# Patient Record
Sex: Female | Born: 1935 | Race: White | Hispanic: No | Smoking: Former smoker
Health system: Southern US, Community
[De-identification: ages and names within clinical notes are randomized; demographics above are authoritative.]

## PROBLEM LIST (undated history)

## (undated) DIAGNOSIS — K5900 Constipation, unspecified: Secondary | ICD-10-CM

## (undated) DIAGNOSIS — F329 Major depressive disorder, single episode, unspecified: Secondary | ICD-10-CM

## (undated) DIAGNOSIS — R51 Headache: Secondary | ICD-10-CM

## (undated) DIAGNOSIS — M1990 Unspecified osteoarthritis, unspecified site: Secondary | ICD-10-CM

## (undated) DIAGNOSIS — E785 Hyperlipidemia, unspecified: Secondary | ICD-10-CM

## (undated) DIAGNOSIS — M1711 Unilateral primary osteoarthritis, right knee: Secondary | ICD-10-CM

## (undated) DIAGNOSIS — R600 Localized edema: Secondary | ICD-10-CM

## (undated) DIAGNOSIS — N3281 Overactive bladder: Secondary | ICD-10-CM

## (undated) DIAGNOSIS — F32A Depression, unspecified: Secondary | ICD-10-CM

## (undated) DIAGNOSIS — N189 Chronic kidney disease, unspecified: Secondary | ICD-10-CM

## (undated) DIAGNOSIS — R2681 Unsteadiness on feet: Secondary | ICD-10-CM

## (undated) DIAGNOSIS — I872 Venous insufficiency (chronic) (peripheral): Secondary | ICD-10-CM

## (undated) DIAGNOSIS — K5901 Slow transit constipation: Secondary | ICD-10-CM

## (undated) DIAGNOSIS — M169 Osteoarthritis of hip, unspecified: Secondary | ICD-10-CM

## (undated) DIAGNOSIS — I10 Essential (primary) hypertension: Secondary | ICD-10-CM

## (undated) HISTORY — PX: OTHER SURGICAL HISTORY: SHX169

## (undated) HISTORY — DX: Overactive bladder: N32.81

## (undated) HISTORY — PX: BREAST SURGERY: SHX581

## (undated) HISTORY — DX: Constipation, unspecified: K59.00

## (undated) HISTORY — DX: Chronic kidney disease, unspecified: N18.9

## (undated) HISTORY — PX: BREAST EXCISIONAL BIOPSY: SUR124

## (undated) HISTORY — PX: GLAUCOMA VALVE INSERTION: SHX5297

## (undated) HISTORY — DX: Unsteadiness on feet: R26.81

## (undated) HISTORY — DX: Hyperlipidemia, unspecified: E78.5

## (undated) HISTORY — DX: Localized edema: R60.0

## (undated) HISTORY — DX: Unilateral primary osteoarthritis, right knee: M17.11

## (undated) HISTORY — DX: Slow transit constipation: K59.01

## (undated) HISTORY — DX: Venous insufficiency (chronic) (peripheral): I87.2

## (undated) HISTORY — DX: Osteoarthritis of hip, unspecified: M16.9

---

## 1958-01-29 HISTORY — PX: BREAST BIOPSY: SHX20

## 1997-05-11 ENCOUNTER — Ambulatory Visit (HOSPITAL_COMMUNITY): Admission: RE | Admit: 1997-05-11 | Discharge: 1997-05-11 | Payer: Self-pay | Admitting: Internal Medicine

## 1997-09-06 ENCOUNTER — Ambulatory Visit (HOSPITAL_COMMUNITY): Admission: RE | Admit: 1997-09-06 | Discharge: 1997-09-06 | Payer: Self-pay | Admitting: Gynecology

## 1998-01-29 HISTORY — PX: REDUCTION MAMMAPLASTY: SUR839

## 1998-05-31 ENCOUNTER — Ambulatory Visit (HOSPITAL_COMMUNITY): Admission: RE | Admit: 1998-05-31 | Discharge: 1998-05-31 | Payer: Self-pay | Admitting: Internal Medicine

## 1998-05-31 ENCOUNTER — Encounter: Payer: Self-pay | Admitting: Internal Medicine

## 1998-11-08 ENCOUNTER — Encounter: Payer: Self-pay | Admitting: Internal Medicine

## 1998-11-08 ENCOUNTER — Ambulatory Visit (HOSPITAL_COMMUNITY): Admission: RE | Admit: 1998-11-08 | Discharge: 1998-11-08 | Payer: Self-pay | Admitting: Internal Medicine

## 1999-02-16 ENCOUNTER — Encounter: Payer: Self-pay | Admitting: Internal Medicine

## 1999-02-16 ENCOUNTER — Encounter: Admission: RE | Admit: 1999-02-16 | Discharge: 1999-02-16 | Payer: Self-pay | Admitting: Internal Medicine

## 1999-05-01 ENCOUNTER — Encounter: Admission: RE | Admit: 1999-05-01 | Discharge: 1999-05-01 | Payer: Self-pay | Admitting: Internal Medicine

## 1999-05-01 ENCOUNTER — Encounter: Payer: Self-pay | Admitting: Internal Medicine

## 1999-05-04 ENCOUNTER — Other Ambulatory Visit: Admission: RE | Admit: 1999-05-04 | Discharge: 1999-05-04 | Payer: Self-pay | Admitting: Gynecology

## 2000-05-10 ENCOUNTER — Other Ambulatory Visit: Admission: RE | Admit: 2000-05-10 | Discharge: 2000-05-10 | Payer: Self-pay | Admitting: Gynecology

## 2000-05-13 ENCOUNTER — Encounter: Payer: Self-pay | Admitting: Gynecology

## 2000-05-13 ENCOUNTER — Encounter: Admission: RE | Admit: 2000-05-13 | Discharge: 2000-05-13 | Payer: Self-pay | Admitting: Gynecology

## 2000-05-17 ENCOUNTER — Encounter (INDEPENDENT_AMBULATORY_CARE_PROVIDER_SITE_OTHER): Payer: Self-pay

## 2000-05-17 ENCOUNTER — Other Ambulatory Visit: Admission: RE | Admit: 2000-05-17 | Discharge: 2000-05-17 | Payer: Self-pay | Admitting: Gynecology

## 2001-01-29 HISTORY — PX: ABDOMINAL HYSTERECTOMY: SHX81

## 2001-05-21 ENCOUNTER — Encounter: Payer: Self-pay | Admitting: Gynecology

## 2001-05-21 ENCOUNTER — Encounter: Admission: RE | Admit: 2001-05-21 | Discharge: 2001-05-21 | Payer: Self-pay | Admitting: Gynecology

## 2001-06-16 ENCOUNTER — Other Ambulatory Visit: Admission: RE | Admit: 2001-06-16 | Discharge: 2001-06-16 | Payer: Self-pay | Admitting: Gynecology

## 2002-06-04 ENCOUNTER — Other Ambulatory Visit: Admission: RE | Admit: 2002-06-04 | Discharge: 2002-06-04 | Payer: Self-pay | Admitting: Gynecology

## 2002-06-05 ENCOUNTER — Encounter: Admission: RE | Admit: 2002-06-05 | Discharge: 2002-06-05 | Payer: Self-pay | Admitting: Internal Medicine

## 2002-06-05 ENCOUNTER — Encounter: Payer: Self-pay | Admitting: Internal Medicine

## 2002-06-18 ENCOUNTER — Encounter: Payer: Self-pay | Admitting: Gynecology

## 2002-06-18 ENCOUNTER — Encounter: Admission: RE | Admit: 2002-06-18 | Discharge: 2002-06-18 | Payer: Self-pay | Admitting: Gynecology

## 2002-07-02 ENCOUNTER — Encounter: Admission: RE | Admit: 2002-07-02 | Discharge: 2002-07-02 | Payer: Self-pay | Admitting: Internal Medicine

## 2002-07-02 ENCOUNTER — Encounter: Payer: Self-pay | Admitting: Internal Medicine

## 2002-12-11 ENCOUNTER — Ambulatory Visit (HOSPITAL_COMMUNITY): Admission: RE | Admit: 2002-12-11 | Discharge: 2002-12-11 | Payer: Self-pay | Admitting: Gastroenterology

## 2003-08-05 ENCOUNTER — Encounter: Admission: RE | Admit: 2003-08-05 | Discharge: 2003-08-05 | Payer: Self-pay | Admitting: Internal Medicine

## 2003-08-09 ENCOUNTER — Other Ambulatory Visit: Admission: RE | Admit: 2003-08-09 | Discharge: 2003-08-09 | Payer: Self-pay | Admitting: Gynecology

## 2003-12-20 ENCOUNTER — Encounter: Admission: RE | Admit: 2003-12-20 | Discharge: 2003-12-20 | Payer: Self-pay | Admitting: Neurology

## 2004-06-05 ENCOUNTER — Encounter: Admission: RE | Admit: 2004-06-05 | Discharge: 2004-06-05 | Payer: Self-pay | Admitting: Internal Medicine

## 2004-09-05 ENCOUNTER — Ambulatory Visit (HOSPITAL_COMMUNITY): Admission: RE | Admit: 2004-09-05 | Discharge: 2004-09-05 | Payer: Self-pay | Admitting: Internal Medicine

## 2005-02-12 ENCOUNTER — Encounter: Admission: RE | Admit: 2005-02-12 | Discharge: 2005-02-12 | Payer: Self-pay | Admitting: Internal Medicine

## 2005-06-26 ENCOUNTER — Inpatient Hospital Stay (HOSPITAL_COMMUNITY): Admission: RE | Admit: 2005-06-26 | Discharge: 2005-06-28 | Payer: Self-pay | Admitting: Obstetrics and Gynecology

## 2005-06-26 ENCOUNTER — Encounter (INDEPENDENT_AMBULATORY_CARE_PROVIDER_SITE_OTHER): Payer: Self-pay | Admitting: *Deleted

## 2005-07-13 ENCOUNTER — Encounter: Admission: RE | Admit: 2005-07-13 | Discharge: 2005-07-13 | Payer: Self-pay | Admitting: Internal Medicine

## 2005-12-26 ENCOUNTER — Ambulatory Visit (HOSPITAL_COMMUNITY): Admission: RE | Admit: 2005-12-26 | Discharge: 2005-12-26 | Payer: Self-pay | Admitting: Obstetrics and Gynecology

## 2006-03-21 ENCOUNTER — Encounter: Admission: RE | Admit: 2006-03-21 | Discharge: 2006-03-21 | Payer: Self-pay | Admitting: Internal Medicine

## 2006-09-12 ENCOUNTER — Ambulatory Visit (HOSPITAL_COMMUNITY): Admission: RE | Admit: 2006-09-12 | Discharge: 2006-09-12 | Payer: Self-pay | Admitting: Internal Medicine

## 2006-09-12 ENCOUNTER — Ambulatory Visit: Payer: Self-pay | Admitting: Vascular Surgery

## 2006-09-12 ENCOUNTER — Encounter (INDEPENDENT_AMBULATORY_CARE_PROVIDER_SITE_OTHER): Payer: Self-pay | Admitting: Internal Medicine

## 2006-12-09 ENCOUNTER — Encounter: Admission: RE | Admit: 2006-12-09 | Discharge: 2006-12-09 | Payer: Self-pay | Admitting: Internal Medicine

## 2007-08-12 LAB — HM COLONOSCOPY

## 2007-11-08 ENCOUNTER — Ambulatory Visit (HOSPITAL_COMMUNITY): Admission: RE | Admit: 2007-11-08 | Discharge: 2007-11-08 | Payer: Self-pay | Admitting: Emergency Medicine

## 2007-12-16 ENCOUNTER — Encounter: Admission: RE | Admit: 2007-12-16 | Discharge: 2007-12-16 | Payer: Self-pay | Admitting: Internal Medicine

## 2008-01-14 ENCOUNTER — Encounter: Admission: RE | Admit: 2008-01-14 | Discharge: 2008-01-14 | Payer: Self-pay | Admitting: Gastroenterology

## 2009-03-08 ENCOUNTER — Encounter: Admission: RE | Admit: 2009-03-08 | Discharge: 2009-03-08 | Payer: Self-pay | Admitting: Neurosurgery

## 2009-03-21 ENCOUNTER — Encounter: Admission: RE | Admit: 2009-03-21 | Discharge: 2009-03-21 | Payer: Self-pay | Admitting: Internal Medicine

## 2009-06-02 ENCOUNTER — Encounter: Admission: RE | Admit: 2009-06-02 | Discharge: 2009-06-02 | Payer: Self-pay | Admitting: Obstetrics and Gynecology

## 2009-09-15 ENCOUNTER — Encounter: Admission: RE | Admit: 2009-09-15 | Discharge: 2009-09-15 | Payer: Self-pay | Admitting: Neurosurgery

## 2010-02-18 ENCOUNTER — Encounter: Payer: Self-pay | Admitting: Neurological Surgery

## 2010-02-18 ENCOUNTER — Encounter: Payer: Self-pay | Admitting: Obstetrics and Gynecology

## 2010-02-19 ENCOUNTER — Encounter: Payer: Self-pay | Admitting: Internal Medicine

## 2010-02-20 ENCOUNTER — Encounter: Payer: Self-pay | Admitting: Internal Medicine

## 2010-04-08 ENCOUNTER — Inpatient Hospital Stay (INDEPENDENT_AMBULATORY_CARE_PROVIDER_SITE_OTHER)
Admission: RE | Admit: 2010-04-08 | Discharge: 2010-04-08 | Disposition: A | Discharge status: Home / Self Care | Payer: Medicare Other | Source: Ambulatory Visit | Attending: Emergency Medicine | Admitting: Emergency Medicine

## 2010-04-08 ENCOUNTER — Ambulatory Visit (INDEPENDENT_AMBULATORY_CARE_PROVIDER_SITE_OTHER): Payer: Medicare Other

## 2010-04-08 DIAGNOSIS — R0989 Other specified symptoms and signs involving the circulatory and respiratory systems: Secondary | ICD-10-CM

## 2010-04-08 DIAGNOSIS — I498 Other specified cardiac arrhythmias: Secondary | ICD-10-CM

## 2010-04-08 DIAGNOSIS — R0609 Other forms of dyspnea: Secondary | ICD-10-CM

## 2010-06-13 ENCOUNTER — Ambulatory Visit (INDEPENDENT_AMBULATORY_CARE_PROVIDER_SITE_OTHER): Payer: Medicare Other

## 2010-06-13 ENCOUNTER — Inpatient Hospital Stay (INDEPENDENT_AMBULATORY_CARE_PROVIDER_SITE_OTHER)
Admission: RE | Admit: 2010-06-13 | Discharge: 2010-06-13 | Disposition: A | Discharge status: Home / Self Care | Payer: Medicare Other | Source: Ambulatory Visit | Attending: Family Medicine | Admitting: Family Medicine

## 2010-06-13 DIAGNOSIS — S92309A Fracture of unspecified metatarsal bone(s), unspecified foot, initial encounter for closed fracture: Secondary | ICD-10-CM

## 2010-06-16 NOTE — Op Note (Signed)
[name], OSE NO.:  [id number]   MEDICAL RECORD NO.:  [id number]          PATIENT TYPE:  INP   LOCATION:  9319                          FACILITY:  [facility]   PHYSICIAN:  Randye Lobo, M.D.   DATE OF BIRTH:  Jan 22, 1936   DATE OF PROCEDURE:  06/26/2005  DATE OF DISCHARGE:                                 OPERATIVE REPORT   PREOPERATIVE DIAGNOSIS:  1.  Incomplete uterovaginal prolapse.  2.  Genuine stress incontinence.  3.  Right vulvar sebaceous cyst.  4.  Right thigh nevus.   POSTOPERATIVE DIAGNOSIS:  1.  Incomplete uterovaginal prolapse.  2.  Genuine stress incontinence.  3.  Right vulvar sebaceous cyst.  4.  Right thigh nevus.   PROCEDURE:  Laparoscopically assisted vaginal hysterectomy with bilateral  salpingo-oophorectomy, McCall culdoplasty, anterior and posterior  colporrhaphy, tension-free vaginal tape suburethral sling, cystoscopy,  excision of right vulvar sebaceous cyst, excision of right thigh nevus.   SURGEON:  Conley Simmonds, MD   ASSISTANT:  Lodema Hong, MD   ANESTHESIA:  General endotracheal, local with 0.5% lidocaine with 1:200,000  of epinephrine vaginally and 0.5% Marcaine for local anesthesia of the  abdominal incisions.   IV FLUIDS:  2000 mL Ringer's lactate.   ESTIMATED BLOOD LOSS:  150 mL.   URINE OUTPUT:  350 mL.   COMPLICATIONS:  None.   INDICATIONS FOR PROCEDURE:  The patient is a 75 year old gravida 1, para 1  Caucasian female who has a 2 to 2-1/2 year history of a dropped bladder and  urinary incontinence.  The patient reported leakage of urine with coughing,  laughing and sneezing which was a social problem for her.  On pelvic  examination, the patient was noted to have a cystocele and rectocele and  uterine prolapse by her primary gynecologist, Revonda Standard, who sent her  to me for further evaluation and treatment.  The patient wished for surgical  cure and she had multichannel urodynamic testing performed which  documented  the presence of genuine stress incontinence with a leak point pressure of 79  cm of water.   The patient's family history was significant for uterine cancer in the  patient's mother.  On pelvic exam, the patient was noted to have on the  right medial thigh a 3 mm black flat nevus and a 0.75 cm sebaceous cyst of  the right labia majora.  The urethra was normal.  The patient had a second  degree cystocele, first-degree uterine prolapse, and a second-degree high  rectocele.  The cervix was noted to be normal in its appearance and the  uterus was small and there were no adnexal masses.   A plan was made to proceed with a laparoscopically assisted vaginal  hysterectomy with bilateral salpingo-oophorectomy, anterior and posterior  colporrhaphy, tension-free vaginal tape and cystoscopy and removal of the  right thigh nevus and vulvar sebaceous cyst after risks, benefits and  alternatives were discussed.   FINDINGS:  Examination under anesthesia revealed a second-degree cystocele,  first-degree uterine prolapse, and a small high rectocele versus a small  enterocele.  The uterus  had a normal appearance and the ovaries were  unremarkable.  The fallopian tubes were consistent with prior tubal surgery.   Cystoscopy with placement of a TVT sling documented the absence of a foreign  body in the bladder or the urethra.  The ureters were noted to be patent  bilaterally and the bladder was normal throughout 360 degrees including the  bladder dome and trigone.   SPECIMENS:  The uterus, tubes and ovaries were sent to pathology separately  from the right thigh nevus.   PROCEDURE:  The patient was reidentified in the preop hold area.  She did  receive Ancef 1 gram IV for antibiotic prophylaxis.  The patient received  both TED hose and PAS stockings for DVT prophylaxis.  The patient was  brought to the operating room where general endotracheal anesthesia was  induced.  The patient was placed  in the dorsal lithotomy position and the  abdomen and vagina were then sterilely prepped and draped.  A Foley catheter  was placed inside the bladder.  The speculum was placed inside the vagina  and a single-tooth tenaculum was placed on the anterior cervical lip.  This  was replaced with a Hulka tenaculum after the uterus was sounded to document  the proper endometrial canal.   The laparoscopy portion of the procedure began by creating a 1 cm umbilical  incision with the scalpel.  The dissection was carried down to the fascia  with an Allis clamp.  A 10 mm trocar was inserted directly into the  peritoneal cavity and the laparoscope confirmed proper placement.  A  pneumoperitoneum was achieved and the patient was placed in Trendelenburg  position.  A 5 mm incision was created in each the right and left lower  quadrants and 5 mm trocars were then placed into the peritoneal cavity under  visualization with the laparoscope.   An inspection of the pelvic and abdominal organs was performed and the  findings were as noted above.  The procedure began on the patient's right-  hand side.  The right ureter was identified.  The right infundibulopelvic  ligament was then grasped and it was cauterized with the tripolar instrument  before it was divided using the same instrument.  The dissection was carried  down through the broad ligament, again using the tripolar instrument.  The  tripolar was similarly used to grasp the round ligament on the right-hand  side before it was cauterized and then divided.  The dissection was carried  down to the level of the uterine artery on this side.  The same procedure  that was performed on the right-hand side in the region of the adnexal  structures was then repeated on the left-hand side after again the left  ureter was identified and noted to be well out of the way of the operative field.  Again the dissection was carried down to the level of the left  uterine  artery.  With excellent hemostasis at this point the laparoscopic  portion of the procedure was discontinued in the remainder of the  hysterectomy was performed vaginally.   The patient was then placed in the high lithotomy position.  A single-tooth  tenaculum was placed across the anterior and posterior cervical lips.  The  cervix was circumferentially injected with half percent lidocaine with  1:200,000 of epinephrine.  The cervix was then circumscribed down to the  endopelvic fascia.  The posterior cul-de-sac was entered sharply with Mayo  scissors.  Digital exam confirmed proper  entry into this location.  A  weighted speculum was placed in the posterior cul-de-sac.  Heaney clamps  were used to clamp each of the uterosacral ligament before they were sharply  divided and suture ligated with transfixing sutures of 0 Vicryl bilaterally.  The anterior cul-de-sac was then entered sharply and again digital exam  confirmed proper entry into this location.  A Deaver was placed in the  anterior cul-de-sac.  The cardinal ligaments were then sequentially clamped,  sharply divided and suture ligated with 0 Vicryl.  The top of the  uterosacral ligaments were then clamped and sharply divided and the specimen  was freed at this time and sent to pathology.  Each of the pedicles along  the upper portion of the uterosacral ligaments were first tied with free  ties of 0 Vicryl followed by transfixing sutures of 0 Vicryl.  This created  good hemostasis bilaterally.   The pedicle sites were examined and everything was found to be hemostatic.  The posterior vaginal cuff was then sutured with a running locked suture of  0 Vicryl to create hemostasis in this area.  The McCall culdoplasty was then  performed with a 0 Vicryl suture.  The suture was brought through the vagina  and into the posterior cul-de-sac at the 6 o'clock position, down through  the distal uterosacral ligament on the patient's left-hand  side, across the  posterior cul-de-sac in a pursestring fashion and then down through the  distal right uterosacral ligament prior to exiting through the posterior cul-  de-sac and into the vagina again at the 6 o'clock position.  The suture was  held until the end of the closure of the vaginal cuff at which time it was  tied and there was excellent elevation and support of the vaginal cuff.   The anterior colporrhaphy and TVT sling was performed next.  Allis clamps  were used to mark the midline of the anterior vaginal wall up to 1 cm below  the urethral meatus.  The vaginal mucosa was injected with half percent  lidocaine with 1:200,000 of epinephrine.  The vaginal mucosa was incised  vertically.  The endopelvic fascia and bladder were dissected off of the  overlying vaginal mucosa bilaterally.  This dissection was carried down to  the level of the pubic rami and superiorly and down to the uterosacral ligament inferiorly.  This was performed with a combination of sharp and  blunt dissection.   The TVT sling was performed next.  1 cm incisions were created  suprapubically with the scalpel 2 cm to the right and left in the midline.  The Foley catheter left to gravity drainage.  The TVT was performed in a top-  down fashion.  The needle was placed through the right suprapubic incision  and then through out through the vagina on the ipsilateral side, lateral to  the level of the mid urethra.  The same procedure that was performed on the  right-hand side was then repeated on the left-hand side through that  suprapubic incision.  The Foley catheter was removed at this time and  cystoscopy was performed and the findings are as noted above.  The TVT sling  was then attached to the abdominal needle passers and the sling was drawn up  through the suprapubic incisions bilaterally.  The plastic sheath was  separated from the sling and a Kelly clamp was placed between the sling and  the urethra  while the plastic sheaths were removed bilaterally.  Excess  sling material was then excised and the sling was noted to be in good  position.  The anterior colporrhaphy was then performed with 2-0 Vicryl  suture by placing vertical mattress sutures.  Excess anterior vaginal mucosa  was then trimmed and the anterior vaginal wall and the vaginal cuff were  closed with a continuous running locked suture of 2-0 Vicryl.  There was  excellent reduction of the cystocele.  The culdoplasty suture was tied at  this time.   The posterior vaginal wall was evaluated at this time.  There was good  perineal support but there appeared to be a high rectocele/small enterocele.  The posterior vaginal wall was marked with Allis clamps to the level of  approximately 2 cm below the [employer].  The vaginal mucosa was  injected with half percent lidocaine with 1:200,000 of epinephrine.  The  vagina was then incised vertically from the perineum all the way up to the  top Allis.  The vaginal mucosa was dissected off the underlying perirectal  fascia bilaterally.  A very small enterocele was encountered.  A pursestring  suture of 2-0 Vicryl was placed for reduction of the enterocele.  The  remainder of the small rectocele repair was performed with vertical mattress  sutures of 2-0 Vicryl.  No excess vaginal mucosa was trimmed at this point  as there was no significant excessive tissue.  The posterior vaginal wall  was then closed with a running locked suture of 2-0 Vicryl which was  continued along the perineal body as for an episiotomy.   At this time the vulvar sebaceous cyst was sharply excised.  The right thigh  nevus was then sharply excised in an elliptical fashion down to the level of  the subcutaneous tissue.  The nevus was sent to pathology.  Monopolar  cautery was used to create hemostasis in the thigh region.  A vertical  subcuticular suture of 4-0 Vicryl was used to close the skin on the  thigh. All of the suprapubic incisions, right vulvar incision and the right thigh  incision were then closed with Dermabond.   Rectal exam confirmed the absence of sutures of the rectum and a packing  with Estrace cream was then placed inside the vagina.  The Foley catheter  was left to gravity drainage.   The pneumoperitoneum was re-achieved to evaluate all of the pedicles from  the hysterectomy which were noted to be hemostatic.  The pelvis was  irrigated and suctioned.  The laparoscopic instruments were then removed.  The pneumoperitoneum was released as the 10 mm laparoscope was  simultaneously removed.  The umbilical incision was closed with a  subcuticular suture of 4-0 Vicryl.  The right and left lower quadrant  incisions were closed with Dermabond.  The skin incisions abdominally were  injected with 0.25% Marcaine.   The patient was cleansed of any remaining Betadine.  She was awakened and  extubated.  She was escorted to the recovery room in stable and awake  condition.  There were no complications to the procedure.  All needle,  instrument, sponge counts were correct.      Randye Lobo, M.D.  Electronically Signed     BES/MEDQ  D:  06/26/2005  T:  06/26/2005  Job:  [id number]

## 2010-06-16 NOTE — H&P (Signed)
[name], [name]                ACCOUNT NO.:  [id number]   MEDICAL RECORD NO.:  [id number]          PATIENT TYPE:  AMB   LOCATION:                                 FACILITY:   PHYSICIAN:  Randye Lobo, M.D.   DATE OF BIRTH:  12-30-1935   DATE OF ADMISSION:  DATE OF DISCHARGE:                                HISTORY & PHYSICAL   Preoperative History and Physical examination performed Jun 19, 2005.   CHIEF COMPLAINT:  Urinary incontinence, a dropped bladder.   HISTORY OF PRESENT ILLNESS:  The patient is a 75 year old, gravida 1, para  1, Caucasian female with a 2 to 2-1/2-year history of a dropped bladder and  urinary incontinence.  The patient reports leakage of urine with coughing,  laughing, and sneezing.  The patient has had multi-channel urodynamic  testing performed documenting the presence of genuine stress incontinence  with a leak point pressure of 79 cm of water.  The patient had a  uroflowmetry study with a voided volume of 374 mL and a postvoid residual of  20 mL.  The patient's pattern was intermittent.  On her pressure flow study,  she had a maximum detrusor pressure of 69 cm of water with a vaginal packing  in place.  The patient does have a family history of uterine cancer in her  mother.  The patient would like to have her ovaries removed at the time of  her surgery.   The patient also has a cyst on her vulva which she would like to have  removed.   PAST OBSTETRIC AND GYNECOLOGIC HISTORY:  The patient has had one vaginal  delivery. She is status post bilateral tubal ligation with a tubal reversal.  The patient has had bilateral breast reduction.  The patient's last Pap  smear was performed in July 2005 and was within normal limits.  Her last  mammogram was performed in September 2006.  The patient is not taking any  hormone therapy.   PAST MEDICAL HISTORY:  1.  Hypertension.  2.  Glaucoma.  3.  Mood swings.   MEDICATIONS:  Diltiazem, hydrochlorothiazide,  Xalatan, Isotel, vitamins.   ALLERGIES:  No known drug allergies.   SOCIAL HISTORY:  The patient is not married.  Her partner has recently had  heart surgery. The patient works as a Software engineer.  She  denies the use of tobacco, alcohol, and illicit drugs.   FAMILY MEDICAL HISTORY:  Positive for uterine cancer in the patient's other,  positive for heart disease in the patient's father, positive for  hypertension in patient's brother, positive for stroke in the patient's  brother.   REVIEW OF SYSTEMS:  The patient denies a history of constipation.  She does  occasionally have urgency to have bowel movements and has fecal soiling  which is treated with Metamucil.   The patient has also had a problem with urinary urgency and urinary tract  infections, and she is status post cystoscopy with Dr. Darvin Neighbours in 2006,  and this was normal.   PHYSICAL EXAMINATION:  VITAL SIGNS: Height 5  feet 3-1/2 inches, weight 170  pounds.  Blood pressure 120/70.  HEENT:  Normocephalic and atraumatic.  LUNGS:  Clear to auscultation bilaterally.  HEART: S1, S2 with regular rate and rhythm.  ABDOMEN: Soft and nontender without evidence of hepatosplenomegaly or  organomegaly.  PELVIC: There is a flat, small, blackish, 3 mm nevus of the patient's right  medial superior thigh, and there is a 0.75 cm sebaceous cyst of the right  labial majora.  The urethra is normal. There is a second degree cystocele,  first degree uterine prolapse, and a second degree rectocele.  The cervix is  without lesion.  The uterus is noted to be small and nontender, and there is  no evidence of adnexal masses or tenderness.   IMPRESSION:  The patient is a 75 year old para 1 female with pelvic organ  prolapse, genuine stress incontinence, a right thigh nevus, and a vulvar  sebaceous cyst.   PLAN:  Laparoscopically-assisted vaginal hysterectomy with bilateral  salpingo-oophorectomy, anterior and posterior  colporrhaphy, tension-free  vaginal tape suburethral sling, and cystoscopy along with removal of the  thigh nevus and the vulvar sebaceous cyst.  Risks, benefits, and  alternatives have been discussed with the patient who wishes to proceed.      Randye Lobo, M.D.  Electronically Signed     BES/MEDQ  D:  06/25/2005  T:  06/25/2005  Job:  [id number]

## 2010-06-16 NOTE — Op Note (Signed)
[name], [name] NO.:  [id number]   MEDICAL RECORD NO.:  [id number]          PATIENT TYPE:  AMB   LOCATION:  SDC                           FACILITY:  [facility]   PHYSICIAN:  [name], M.D.   DATE OF BIRTH:  Jan 31, 1935   DATE OF PROCEDURE:  12/26/2005  DATE OF DISCHARGE:                               OPERATIVE REPORT   PREOPERATIVE DIAGNOSIS:  1. Recurrent urinary tract infections.  2.. Urinary urgency and frequency.  1. Status post tension free vaginal tape and cystoscopy in May 2007.   POSTOPERATIVE DIAGNOSIS:  1. Recurrent urinary tract infections.  2. Urinary urgency and frequency.  3. Status post tension free vaginal tape and cystoscopy in May 2007.   PROCEDURE:  Examination under anesthesia and cystoscopy.   SURGEON:  [name], M.D.   FLUIDS REPLACED:  1000 mL Ringer's lactate.   ESTIMATED BLOOD LOSS:  None   INDICATIONS FOR PROCEDURE:  The patient is a 75 year old para 1  Caucasian female, status post laparoscopically assisted vaginal  hysterectomy with bilateral salpingo-oophorectomy, anterior and  posterior colporrhaphy, and tension free vaginal tape with cystoscopy in  May 2007 who, following the surgery, has developed significant and  recurrent urinary tract infections which have been resistant to  ciprofloxacin, Levaquin, penicillin, and cephalosporins.  The patient  has had some urinary tract infections prior to her surgery and  apparently has had a previous cystoscopy by Dr. Darvin Jordan which was  normal.  The patient's urgency and frequency since surgery has been  treated with anticholinergic therapy and recently the patient has been  placed on an Oxytrol patch by Dr. Darvin Jordan.  A plan is made at this  time to perform an exam under anesthesia and a cystoscopy to rule out a  complication from the patient's previous suburethral sling.  A plan is  made to proceed after risks and benefits are reviewed.   FINDINGS:  Examination  under anesthesia revealed an absent cervix and  uterus.  There were no pelvic masses appreciated.  The vagina had good  support as did the urethra.  There was no evidence of any erosion or  exposure of the sling on vaginal examination by visualization and  palpation.   Cystoscopy was performed and the bladder was visualized throughout 360  degrees including the bladder dome and trigone.  There was no evidence  of a foreign body in the bladder or the urethra.  Both of the ureters  were identified and were noted to be patent.  There was no evidence of  any bladder stones nor ulcerations or lesions in the bladder or the  urethra.   SPECIMENS:  None.   DESCRIPTION OF PROCEDURE:  The patient was reidentified in the  preoperative holding area.  The patient was taken to the operating room  where initially mask anesthesia was planned but then this was converted  to general anesthesia.  The patient's vagina and urethra were sterilely  prepped and the patient was draped.   An examination under anesthesia was performed.  A speculum exam was then  performed.  The patient was catheterized of urine.  Cystoscopy was  performed and the findings are as noted above.  The cystoscope was  withdrawn after the bladder was completely emptied.  This concluded the  patient's procedure.  There were no complications.  All needle,  instrument, and sponge counts were correct.   The patient is escorted to the recovery room in stable and awake  condition.      [name], M.D.  Electronically Signed     BES/MEDQ  D:  12/26/2005  T:  12/26/2005  Job:  [id number]   cc:   [name], M.D.  Fax: [phone]

## 2010-06-16 NOTE — Op Note (Signed)
[name], [name] NO.:  [id number]   MEDICAL RECORD NO.:  [id number]          PATIENT TYPE:  INP   LOCATION:  9319                          FACILITY:  [facility]   PHYSICIAN:  Randye Lobo, M.D.   DATE OF BIRTH:  Nov 02, 1935   DATE OF PROCEDURE:  06/26/2005  DATE OF DISCHARGE:                                 OPERATIVE REPORT   PREOPERATIVE DIAGNOSIS:  1.  Incomplete uterovaginal   Dictation ended at this point.      Randye Lobo, M.D.  Electronically Signed     BES/MEDQ  D:  06/26/2005  T:  06/26/2005  Job:  [id number]

## 2010-06-16 NOTE — Op Note (Signed)
   NAME:  [name], [name]                           ACCOUNT NO.:  [id number]   MEDICAL RECORD NO.:  [id number]                   PATIENT TYPE:  AMB   LOCATION:  ENDO                                 FACILITY:  [facility]   PHYSICIAN:  James L. Malon Kindle., M.D.          DATE OF BIRTH:  [birth year]   DATE OF PROCEDURE:  12/11/2002  DATE OF DISCHARGE:                                 OPERATIVE REPORT   PROCEDURE:  Colonoscopy.   MEDICATIONS:  Fentanyl 50 mcg, Versed 7 mg IV.   INDICATIONS:  Colon evaluation in patient who originally was set out as an  average risk.  The patient then remembered that her mother had colon cancer.   DESCRIPTION OF PROCEDURE:  The procedure had been explained to the patient  and consent obtained.  With the patient in the left lateral decubitus  position, the Olympus scope was inserted and advanced.  The prep was  excellent.  The patient had diffuse melanosis coli.  We were able to advance  to the cecum, ileocecal valve appendiceal orifice seen, scope withdrawn.  The cecum, ascending colon, transverse colon, descending and sigmoid colon  were seen well.  Diffuse melanosis coli throughout.  No polyps were seen.  The scope was withdrawn.  The patient tolerated the procedure well.   IMPRESSION:  1. No evidence of colon polyps in patient with family history of colon     polyps.  2. Melanosis coli.   PLAN:  Will recommend repeating the procedure in five years.                                               James L. Malon Kindle., M.D.    Waldron Session  D:  12/11/2002  T:  12/11/2002  Job:  [id number]   cc:   Erskine Speed, M.D.  [address], [address]  [place]  [place] [address]  Fax: [phone]

## 2010-06-16 NOTE — Discharge Summary (Signed)
[name], MONDA NO.:  [id number]   MEDICAL RECORD NO.:  [id number]          PATIENT TYPE:  INP   LOCATION:  9319                          FACILITY:  [facility]   PHYSICIAN:  Randye Lobo, M.D.   DATE OF BIRTH:  Jun 07, 1935   DATE OF ADMISSION:  06/26/2005  DATE OF DISCHARGE:  06/28/2005                                 DISCHARGE SUMMARY   ADMISSION DIAGNOSIS:  1. Incomplete uterovaginal prolapse.  2. Genuine stress incontinence.  3. Right vulvar sebaceous cyst.  4. Right thigh nevus.   DISCHARGE DIAGNOSIS:  1. Incomplete uterovaginal prolapse.  2. Genuine stress incontinence.  3. Right vulvar sebaceous cyst.  4. Right thigh nevus.   SIGNIFICANT OPERATIONS AND PROCEDURES:  The patient underwent a  laparoscopically-assisted vaginal hysterectomy with bilateral salpingo-  oophorectomy, McCall culdoplasty, anterior and posterior colporrhaphy,  tension-free vaginal tape suburethral sling, cystoscopy, excision of right  vulvar sebaceous cyst, and excision of right thigh nevus under the direction  of Dr. Conley Simmonds with the assistance of Dr. Lodema Hong at the [facility] [place] on Jun 26, 2005.   ADMISSION HISTORY AND PHYSICAL EXAMINATION:  The patient is a 75 year old  gravida 1, para 1 Caucasian female who presented with a chief complaint of  urinary incontinence and a dropped bladder.  On physical exam, the patient  was noted to have a 3 mm dark nevus along her right medial superior thigh  and a 0.75 cm sebaceous cyst of the right labia majora.  The urethra was  unremarkable.  There was a second-degree cystocele, first-degree uterine  prolapse, and a second-degree rectocele appreciated.  The uterus was small  and nontender.  There was no evidence of any adnexal masses or tenderness.   The patient had preoperative multichannel urodynamic testing which  documented the presence of genuine stress incontinence.   The patient wished for surgical  treatment of her prolapse and incontinence,  and she wished for removal of her tubes and ovaries at the same time.  The  patient also wished for excision of the thigh nevus and vulvar sebaceous  cyst.   HOSPITAL COURSE:  The patient was admitted on Jun 26, 2005, at which time  she underwent a laparoscopically-assisted vaginal hysterectomy with  bilateral salpingo-oophorectomy, McCall culdoplasty, anterior and posterior  colporrhaphy, tension-free vaginal tape suburethral sling with cystoscopy,  excision of right vulvar sebaceous cyst and excision of right thigh nevus  under the direction of Dr. Conley Simmonds with the assistance of Dr. Lodema Hong.  Findings at surgery included a normal uterus and ovaries.  The  fallopian tubes were consistent with prior bilateral tubal ligation.  There  was a second-degree cystocele, first-degree uterine prolapse, and a small  high rectocele and enterocele which were appreciated.  Cystoscopy  demonstrated the absence of a foreign body in the bladder and urethra.  The  ureters were noted to be patent bilaterally.   The patient had an unremarkable surgery and surgical recovery.  The  patient's pain was controlled postoperatively with a morphine PCA and  Toradol.  She was  converted over to oral Percocet and ibuprofen, which she  tolerated well, on postoperative day #1.  The patient was able to ambulate  on postoperative day #1.  She began bladder training which continued  throughout the patient's hospitalization.  The patient tolerated a regular  diet prior to her discharge.  The patient's postoperative day #1 hemoglobin  was 12.7.  The patient's pathology report was pending at the time of her  discharge.   The patient was found to be in good condition and ready for discharge on  postoperative day #2.   DISCHARGE INSTRUCTIONS:  1. Discharged to home.  2. The patient will take the following medications, Percocet 5 mg/325 mg      one to two p.o. q. 4-6  hours p.r.n. pain, ibuprofen 600 mg p.o. q.6 h      p.r.n. pain.  The patient will resume her other usual medications in      the usual dosages.  3. The patient will follow a regular diet.  4. The patient will have decreased activity for the next 6 weeks.  She      will not drive for 2 weeks.  She will not lift anything heavy for the      next 12 weeks.  5. The patient will be discharged to home with the Foley catheter if her      postvoid residuals are greater than 75 mL.  6. The patient will follow up in the office the following week.  She will      call if she experiences any problems with fever, increased pain,      vaginal bleeding, nausea and vomiting, difficulty with voiding, or any      other concern.      Randye Lobo, M.D.  Electronically Signed     BES/MEDQ  D:  08/21/2005  T:  08/21/2005  Job:  [id number]

## 2010-08-31 ENCOUNTER — Ambulatory Visit (HOSPITAL_COMMUNITY): Payer: Medicare Other

## 2010-09-13 ENCOUNTER — Ambulatory Visit (HOSPITAL_COMMUNITY)
Admission: RE | Admit: 2010-09-13 | Discharge: 2010-09-13 | Disposition: A | Discharge status: Home / Self Care | Payer: Medicare Other | Source: Ambulatory Visit | Attending: Internal Medicine | Admitting: Internal Medicine

## 2010-09-13 DIAGNOSIS — R609 Edema, unspecified: Secondary | ICD-10-CM | POA: Insufficient documentation

## 2010-09-13 DIAGNOSIS — I059 Rheumatic mitral valve disease, unspecified: Secondary | ICD-10-CM

## 2010-09-13 DIAGNOSIS — R072 Precordial pain: Secondary | ICD-10-CM | POA: Insufficient documentation

## 2010-10-31 LAB — BUN: BUN: 16

## 2010-10-31 LAB — CREATININE, SERUM
Creatinine, Ser: 0.85
GFR calc Af Amer: >60
GFR calc non Af Amer: >60

## 2010-11-17 DIAGNOSIS — H269 Unspecified cataract: Secondary | ICD-10-CM | POA: Insufficient documentation

## 2010-11-17 DIAGNOSIS — H409 Unspecified glaucoma: Secondary | ICD-10-CM | POA: Insufficient documentation

## 2011-02-07 DIAGNOSIS — M19049 Primary osteoarthritis, unspecified hand: Secondary | ICD-10-CM | POA: Diagnosis not present

## 2011-02-07 DIAGNOSIS — M19079 Primary osteoarthritis, unspecified ankle and foot: Secondary | ICD-10-CM | POA: Diagnosis not present

## 2011-02-07 DIAGNOSIS — M5137 Other intervertebral disc degeneration, lumbosacral region: Secondary | ICD-10-CM | POA: Diagnosis not present

## 2011-02-07 DIAGNOSIS — M76899 Other specified enthesopathies of unspecified lower limb, excluding foot: Secondary | ICD-10-CM | POA: Diagnosis not present

## 2011-02-20 DIAGNOSIS — M1610 Unilateral primary osteoarthritis, unspecified hip: Secondary | ICD-10-CM | POA: Diagnosis not present

## 2011-02-27 DIAGNOSIS — H4011X0 Primary open-angle glaucoma, stage unspecified: Secondary | ICD-10-CM | POA: Diagnosis not present

## 2011-02-27 DIAGNOSIS — H409 Unspecified glaucoma: Secondary | ICD-10-CM | POA: Diagnosis not present

## 2011-02-27 DIAGNOSIS — H2510 Age-related nuclear cataract, unspecified eye: Secondary | ICD-10-CM | POA: Diagnosis not present

## 2011-03-08 DIAGNOSIS — M25559 Pain in unspecified hip: Secondary | ICD-10-CM | POA: Diagnosis not present

## 2011-03-13 DIAGNOSIS — M25559 Pain in unspecified hip: Secondary | ICD-10-CM | POA: Diagnosis not present

## 2011-04-15 ENCOUNTER — Other Ambulatory Visit: Payer: Self-pay | Admitting: Orthopedic Surgery

## 2011-04-30 DIAGNOSIS — I10 Essential (primary) hypertension: Secondary | ICD-10-CM | POA: Diagnosis not present

## 2011-05-03 DIAGNOSIS — M25559 Pain in unspecified hip: Secondary | ICD-10-CM | POA: Diagnosis not present

## 2011-05-03 DIAGNOSIS — M169 Osteoarthritis of hip, unspecified: Secondary | ICD-10-CM | POA: Diagnosis not present

## 2011-05-04 ENCOUNTER — Other Ambulatory Visit (HOSPITAL_COMMUNITY): Payer: Self-pay | Admitting: Orthopaedic Surgery

## 2011-05-07 DIAGNOSIS — L600 Ingrowing nail: Secondary | ICD-10-CM | POA: Diagnosis not present

## 2011-05-08 ENCOUNTER — Encounter (HOSPITAL_COMMUNITY): Payer: Self-pay | Admitting: Pharmacy Technician

## 2011-05-09 ENCOUNTER — Other Ambulatory Visit (HOSPITAL_COMMUNITY): Payer: Medicare Other

## 2011-05-11 DIAGNOSIS — H409 Unspecified glaucoma: Secondary | ICD-10-CM | POA: Diagnosis not present

## 2011-05-11 DIAGNOSIS — H269 Unspecified cataract: Secondary | ICD-10-CM | POA: Diagnosis not present

## 2011-05-11 DIAGNOSIS — H4011X0 Primary open-angle glaucoma, stage unspecified: Secondary | ICD-10-CM | POA: Diagnosis not present

## 2011-05-11 DIAGNOSIS — H2510 Age-related nuclear cataract, unspecified eye: Secondary | ICD-10-CM | POA: Diagnosis not present

## 2011-05-17 ENCOUNTER — Encounter (HOSPITAL_COMMUNITY): Payer: Self-pay | Admitting: Pharmacy Technician

## 2011-05-21 ENCOUNTER — Inpatient Hospital Stay (HOSPITAL_COMMUNITY): Admission: RE | Admit: 2011-05-21 | Payer: Medicare Other | Source: Ambulatory Visit

## 2011-05-28 ENCOUNTER — Encounter (HOSPITAL_COMMUNITY): Payer: Self-pay

## 2011-05-28 ENCOUNTER — Ambulatory Visit (HOSPITAL_COMMUNITY)
Admission: RE | Admit: 2011-05-28 | Discharge: 2011-05-28 | Disposition: A | Discharge status: Home / Self Care | Payer: Medicare Other | Source: Ambulatory Visit | Attending: Orthopaedic Surgery | Admitting: Orthopaedic Surgery

## 2011-05-28 ENCOUNTER — Encounter (HOSPITAL_COMMUNITY)
Admission: RE | Admit: 2011-05-28 | Discharge: 2011-05-28 | Disposition: A | Discharge status: Home / Self Care | Payer: Medicare Other | Source: Ambulatory Visit | Attending: Orthopaedic Surgery | Admitting: Orthopaedic Surgery

## 2011-05-28 DIAGNOSIS — Z87891 Personal history of nicotine dependence: Secondary | ICD-10-CM | POA: Insufficient documentation

## 2011-05-28 DIAGNOSIS — Z01818 Encounter for other preprocedural examination: Secondary | ICD-10-CM | POA: Diagnosis not present

## 2011-05-28 DIAGNOSIS — Z01811 Encounter for preprocedural respiratory examination: Secondary | ICD-10-CM | POA: Diagnosis not present

## 2011-05-28 DIAGNOSIS — I498 Other specified cardiac arrhythmias: Secondary | ICD-10-CM | POA: Insufficient documentation

## 2011-05-28 DIAGNOSIS — Z01812 Encounter for preprocedural laboratory examination: Secondary | ICD-10-CM | POA: Insufficient documentation

## 2011-05-28 DIAGNOSIS — Z01810 Encounter for preprocedural cardiovascular examination: Secondary | ICD-10-CM | POA: Diagnosis not present

## 2011-05-28 HISTORY — DX: Essential (primary) hypertension: I10

## 2011-05-28 HISTORY — DX: Major depressive disorder, single episode, unspecified: F32.9

## 2011-05-28 HISTORY — DX: Depression, unspecified: F32.A

## 2011-05-28 HISTORY — DX: Unspecified osteoarthritis, unspecified site: M19.90

## 2011-05-28 HISTORY — DX: Headache: R51

## 2011-05-28 LAB — CBC
HCT: 40 % (ref 36.0–46.0)
Hemoglobin: 13.7 g/dL (ref 12.0–15.0)
MCH: 32 pg (ref 26.0–34.0)
MCHC: 34.3 g/dL (ref 30.0–36.0)
MCV: 93.5 fL (ref 78.0–100.0)
Platelets: 225 10*3/uL (ref 150–400)
RBC: 4.28 MIL/uL (ref 3.87–5.11)
RDW: 12.6 % (ref 11.5–15.5)
WBC: 5.1 10*3/uL (ref 4.0–10.5)

## 2011-05-28 LAB — SURGICAL PCR SCREEN
MRSA, PCR: NEGATIVE
Staphylococcus aureus: NEGATIVE

## 2011-05-28 LAB — URINALYSIS, ROUTINE W REFLEX MICROSCOPIC
Bilirubin Urine: NEGATIVE
Glucose, UA: NEGATIVE mg/dL
Hgb urine dipstick: NEGATIVE
Ketones, ur: NEGATIVE mg/dL
Nitrite: NEGATIVE
Protein, ur: NEGATIVE mg/dL
Specific Gravity, Urine: 1.014 (ref 1.005–1.030)
Urobilinogen, UA: 0.2 mg/dL (ref 0.0–1.0)
pH: 8 (ref 5.0–8.0)

## 2011-05-28 LAB — BASIC METABOLIC PANEL
BUN: 12 mg/dL (ref 6–23)
CO2: 30 mEq/L (ref 19–32)
Calcium: 10.4 mg/dL (ref 8.4–10.5)
Chloride: 100 mEq/L (ref 96–112)
Creatinine, Ser: 0.95 mg/dL (ref 0.50–1.10)
GFR calc Af Amer: 67 mL/min — ABNORMAL LOW (ref >90)
GFR calc non Af Amer: 58 mL/min — ABNORMAL LOW (ref >90)
Glucose, Bld: 78 mg/dL (ref 70–99)
Potassium: 4.3 mEq/L (ref 3.5–5.1)
Sodium: 135 mEq/L (ref 135–145)

## 2011-05-28 LAB — URINE MICROSCOPIC-ADD ON

## 2011-05-28 LAB — APTT: aPTT: 63 seconds — ABNORMAL HIGH (ref 24–37)

## 2011-05-28 LAB — PROTIME-INR
INR: 1.05 (ref 0.00–1.49)
Prothrombin Time: 13.9 seconds (ref 11.6–15.2)

## 2011-05-28 LAB — ABO/RH: ABO/RH(D): AB POS

## 2011-05-28 NOTE — Patient Instructions (Addendum)
20 [name]  05/28/2011   Your procedure is scheduled on:  06/01/11  Report to SHORT STAY DEPT  at  7:30AM.  Call this number if you have problems the morning of surgery: [phone]   Remember:   Do not eat food or drink liquids AFTER MIDNIGHT    Take these medicines the morning of surgery with A SIP OF WATER: WELLBUTRIN / CARVEDILOL / USE EYE DROPS   Do not wear jewelry, make-up or nail polish.  Do not wear lotions, powders, or perfumes.   Do not shave legs or underarms 48 hrs. before surgery (men may shave face)  Do not bring valuables to the hospital.  Contacts, dentures or bridgework may not be worn into surgery.  Leave suitcase in the car. After surgery it may be brought to your room.  For patients admitted to the hospital, checkout time is 11:00 AM the day of discharge.   Patients discharged the day of surgery will not be allowed to drive home.    Special Instructions:   Please read over the following fact sheets that you were given: MRSA  Information               SHOWER WITH BETASEPT THE NIGHT BEFORE SURGERY AND THE MORNING OF SURGERY

## 2011-05-29 NOTE — Pre-Procedure Instructions (Signed)
SPOKE WITH WENDY AT OFFICE CONCERNING ABNORMAL URINE - SHE WILL INFORM DR. [facility]

## 2011-05-30 DIAGNOSIS — M4000 Postural kyphosis, site unspecified: Secondary | ICD-10-CM | POA: Diagnosis not present

## 2011-05-30 DIAGNOSIS — M542 Cervicalgia: Secondary | ICD-10-CM | POA: Diagnosis not present

## 2011-05-30 DIAGNOSIS — M47812 Spondylosis without myelopathy or radiculopathy, cervical region: Secondary | ICD-10-CM | POA: Diagnosis not present

## 2011-06-01 ENCOUNTER — Inpatient Hospital Stay (HOSPITAL_COMMUNITY)
Admission: RE | Admit: 2011-06-01 | Discharge: 2011-06-05 | DRG: 470 | Disposition: A | Discharge status: Skilled Nursing Facility | Payer: Medicare Other | Source: Ambulatory Visit | Attending: Orthopaedic Surgery | Admitting: Orthopaedic Surgery

## 2011-06-01 ENCOUNTER — Ambulatory Visit (HOSPITAL_COMMUNITY): Payer: Medicare Other | Admitting: Anesthesiology

## 2011-06-01 ENCOUNTER — Ambulatory Visit (HOSPITAL_COMMUNITY): Payer: Medicare Other

## 2011-06-01 ENCOUNTER — Encounter (HOSPITAL_COMMUNITY): Payer: Self-pay | Admitting: Anesthesiology

## 2011-06-01 ENCOUNTER — Encounter (HOSPITAL_COMMUNITY)
Admission: RE | Disposition: A | Discharge status: Skilled Nursing Facility | Payer: Self-pay | Source: Ambulatory Visit | Attending: Orthopaedic Surgery

## 2011-06-01 ENCOUNTER — Encounter (HOSPITAL_COMMUNITY): Payer: Self-pay | Admitting: *Deleted

## 2011-06-01 DIAGNOSIS — I10 Essential (primary) hypertension: Secondary | ICD-10-CM | POA: Diagnosis present

## 2011-06-01 DIAGNOSIS — M1990 Unspecified osteoarthritis, unspecified site: Secondary | ICD-10-CM | POA: Diagnosis not present

## 2011-06-01 DIAGNOSIS — M1610 Unilateral primary osteoarthritis, unspecified hip: Secondary | ICD-10-CM | POA: Diagnosis not present

## 2011-06-01 DIAGNOSIS — M810 Age-related osteoporosis without current pathological fracture: Secondary | ICD-10-CM | POA: Diagnosis present

## 2011-06-01 DIAGNOSIS — D62 Acute posthemorrhagic anemia: Secondary | ICD-10-CM | POA: Diagnosis not present

## 2011-06-01 DIAGNOSIS — F329 Major depressive disorder, single episode, unspecified: Secondary | ICD-10-CM | POA: Diagnosis present

## 2011-06-01 DIAGNOSIS — M25559 Pain in unspecified hip: Secondary | ICD-10-CM | POA: Diagnosis not present

## 2011-06-01 DIAGNOSIS — E876 Hypokalemia: Secondary | ICD-10-CM | POA: Diagnosis not present

## 2011-06-01 DIAGNOSIS — H409 Unspecified glaucoma: Secondary | ICD-10-CM | POA: Diagnosis not present

## 2011-06-01 DIAGNOSIS — M169 Osteoarthritis of hip, unspecified: Secondary | ICD-10-CM | POA: Diagnosis present

## 2011-06-01 DIAGNOSIS — Z471 Aftercare following joint replacement surgery: Secondary | ICD-10-CM | POA: Diagnosis not present

## 2011-06-01 DIAGNOSIS — F3289 Other specified depressive episodes: Secondary | ICD-10-CM | POA: Diagnosis present

## 2011-06-01 DIAGNOSIS — Z5189 Encounter for other specified aftercare: Secondary | ICD-10-CM | POA: Diagnosis not present

## 2011-06-01 DIAGNOSIS — S79919A Unspecified injury of unspecified hip, initial encounter: Secondary | ICD-10-CM | POA: Diagnosis not present

## 2011-06-01 DIAGNOSIS — Z96649 Presence of unspecified artificial hip joint: Secondary | ICD-10-CM | POA: Diagnosis not present

## 2011-06-01 HISTORY — PX: TOTAL HIP ARTHROPLASTY: SHX124

## 2011-06-01 LAB — TYPE AND SCREEN
ABO/RH(D): AB POS
Antibody Screen: NEGATIVE

## 2011-06-01 SURGERY — ARTHROPLASTY, HIP, TOTAL, ANTERIOR APPROACH
Anesthesia: General | Site: Hip | Laterality: Left | Wound class: Clean

## 2011-06-01 MED ORDER — ACETAMINOPHEN 650 MG RE SUPP: 650.0000 mg | Freq: Four times a day (QID) | RECTAL | Status: DC | PRN

## 2011-06-01 MED ORDER — ACETAMINOPHEN 10 MG/ML IV SOLN
INTRAVENOUS | Status: AC
  Filled 2011-06-01: qty 100

## 2011-06-01 MED ORDER — DIPHENHYDRAMINE HCL 12.5 MG/5ML PO ELIX: 12.5000 mg | ORAL_SOLUTION | ORAL | Status: DC | PRN

## 2011-06-01 MED ORDER — PROPOFOL 10 MG/ML IV EMUL
INTRAVENOUS | Status: DC | PRN
  Administered 2011-06-01: 120 mg via INTRAVENOUS

## 2011-06-01 MED ORDER — TRAVOPROST (BAK FREE) 0.004 % OP SOLN
1.0000 [drp] | Freq: Every day | OPHTHALMIC | Status: DC
  Administered 2011-06-01 – 2011-06-04 (×4): 1 [drp] via OPHTHALMIC
  Filled 2011-06-01: qty 2.5

## 2011-06-01 MED ORDER — TRAVOPROST 0.004 % OP SOLN: 1.0000 [drp] | Freq: Every day | OPHTHALMIC | Status: DC

## 2011-06-01 MED ORDER — MORPHINE SULFATE (PF) 1 MG/ML IV SOLN
INTRAVENOUS | Status: AC
  Administered 2011-06-01: 15 mg via INTRAVENOUS
  Filled 2011-06-01: qty 25

## 2011-06-01 MED ORDER — DIPHENHYDRAMINE HCL 12.5 MG/5ML PO ELIX: 12.5000 mg | ORAL_SOLUTION | Freq: Four times a day (QID) | ORAL | Status: DC | PRN

## 2011-06-01 MED ORDER — ACETAMINOPHEN 10 MG/ML IV SOLN
INTRAVENOUS | Status: DC | PRN
  Administered 2011-06-01: 1000 mg via INTRAVENOUS

## 2011-06-01 MED ORDER — HYDROCODONE-ACETAMINOPHEN 5-325 MG PO TABS: 1.0000 | ORAL_TABLET | ORAL | Status: DC | PRN

## 2011-06-01 MED ORDER — MAGNESIUM OXIDE 400 (241.3 MG) MG PO TABS
400.0000 mg | ORAL_TABLET | Freq: Every day | ORAL | Status: DC
  Administered 2011-06-01 – 2011-06-05 (×5): 400 mg via ORAL
  Filled 2011-06-01 (×5): qty 1

## 2011-06-01 MED ORDER — CARVEDILOL 12.5 MG PO TABS
12.5000 mg | ORAL_TABLET | Freq: Two times a day (BID) | ORAL | Status: DC
  Administered 2011-06-01 – 2011-06-05 (×7): 12.5 mg via ORAL
  Filled 2011-06-01 (×10): qty 1

## 2011-06-01 MED ORDER — 0.9 % SODIUM CHLORIDE (POUR BTL) OPTIME
TOPICAL | Status: DC | PRN
  Administered 2011-06-01: 1000 mL

## 2011-06-01 MED ORDER — CEFAZOLIN SODIUM-DEXTROSE 2-3 GM-% IV SOLR
INTRAVENOUS | Status: AC
  Filled 2011-06-01: qty 50

## 2011-06-01 MED ORDER — LIDOCAINE HCL (CARDIAC) 20 MG/ML IV SOLN
INTRAVENOUS | Status: DC | PRN
  Administered 2011-06-01: 100 mg via INTRAVENOUS

## 2011-06-01 MED ORDER — FUROSEMIDE 20 MG PO TABS
20.0000 mg | ORAL_TABLET | Freq: Every day | ORAL | Status: DC
  Administered 2011-06-02 – 2011-06-05 (×4): 20 mg via ORAL
  Filled 2011-06-01 (×5): qty 1

## 2011-06-01 MED ORDER — DEXAMETHASONE SODIUM PHOSPHATE 4 MG/ML IJ SOLN
INTRAMUSCULAR | Status: DC | PRN
  Administered 2011-06-01: 8 mg via INTRAVENOUS

## 2011-06-01 MED ORDER — RIVAROXABAN 10 MG PO TABS
10.0000 mg | ORAL_TABLET | Freq: Every day | ORAL | Status: DC
  Administered 2011-06-02 – 2011-06-05 (×4): 10 mg via ORAL
  Filled 2011-06-01 (×5): qty 1

## 2011-06-01 MED ORDER — PROMETHAZINE HCL 25 MG/ML IJ SOLN: 6.2500 mg | INTRAMUSCULAR | Status: DC | PRN

## 2011-06-01 MED ORDER — MENTHOL 3 MG MT LOZG: 1.0000 | LOZENGE | OROMUCOSAL | Status: DC | PRN

## 2011-06-01 MED ORDER — METHOCARBAMOL 100 MG/ML IJ SOLN
500.0000 mg | Freq: Four times a day (QID) | INTRAVENOUS | Status: DC | PRN
  Filled 2011-06-01 (×2): qty 5

## 2011-06-01 MED ORDER — ONDANSETRON HCL 4 MG PO TABS: 4.0000 mg | ORAL_TABLET | Freq: Four times a day (QID) | ORAL | Status: DC | PRN

## 2011-06-01 MED ORDER — SODIUM CHLORIDE 0.9 % IJ SOLN: 9.0000 mL | INTRAMUSCULAR | Status: DC | PRN

## 2011-06-01 MED ORDER — OXYCODONE HCL 5 MG PO TABS
5.0000 mg | ORAL_TABLET | ORAL | Status: DC | PRN
  Administered 2011-06-03 – 2011-06-04 (×5): 10 mg via ORAL
  Administered 2011-06-05: 5 mg via ORAL
  Filled 2011-06-01: qty 1
  Filled 2011-06-01 (×2): qty 2
  Filled 2011-06-01: qty 1
  Filled 2011-06-01 (×3): qty 2

## 2011-06-01 MED ORDER — POTASSIUM 99 MG PO TABS: 99.0000 mg | ORAL_TABLET | Freq: Every day | ORAL | Status: DC

## 2011-06-01 MED ORDER — ACETAMINOPHEN 325 MG PO TABS: 650.0000 mg | ORAL_TABLET | Freq: Four times a day (QID) | ORAL | Status: DC | PRN

## 2011-06-01 MED ORDER — EPHEDRINE SULFATE 50 MG/ML IJ SOLN
INTRAMUSCULAR | Status: DC | PRN
  Administered 2011-06-01 (×2): 10 mg via INTRAVENOUS

## 2011-06-01 MED ORDER — MORPHINE SULFATE (PF) 1 MG/ML IV SOLN
INTRAVENOUS | Status: DC
  Administered 2011-06-01: 1 mg via INTRAVENOUS
  Administered 2011-06-01: 6 mg via INTRAVENOUS
  Administered 2011-06-01: 18:00:00 via INTRAVENOUS
  Administered 2011-06-02: 9 mg via INTRAVENOUS
  Administered 2011-06-02: 23:00:00 via INTRAVENOUS
  Administered 2011-06-02: 7 mg via INTRAVENOUS
  Administered 2011-06-02: 1 mg via INTRAVENOUS
  Administered 2011-06-02 (×2): 8 mg via INTRAVENOUS
  Administered 2011-06-02: 09:00:00 via INTRAVENOUS
  Administered 2011-06-03: 4 mg via INTRAVENOUS
  Administered 2011-06-03: 2 mg via INTRAVENOUS
  Filled 2011-06-01 (×3): qty 25

## 2011-06-01 MED ORDER — CEFAZOLIN SODIUM-DEXTROSE 2-3 GM-% IV SOLR
2.0000 g | INTRAVENOUS | Status: AC
  Administered 2011-06-01: 1 g via INTRAVENOUS

## 2011-06-01 MED ORDER — ONDANSETRON HCL 4 MG/2ML IJ SOLN: 4.0000 mg | Freq: Four times a day (QID) | INTRAMUSCULAR | Status: DC | PRN

## 2011-06-01 MED ORDER — NEOSTIGMINE METHYLSULFATE 1 MG/ML IJ SOLN
INTRAMUSCULAR | Status: DC | PRN
  Administered 2011-06-01: 4 mg via INTRAVENOUS

## 2011-06-01 MED ORDER — CARVEDILOL 12.5 MG PO TABS
12.5000 mg | ORAL_TABLET | ORAL | Status: AC
  Administered 2011-06-01: 12.5 mg via ORAL
  Filled 2011-06-01: qty 1

## 2011-06-01 MED ORDER — LISINOPRIL 20 MG PO TABS
30.0000 mg | ORAL_TABLET | Freq: Every day | ORAL | Status: DC
  Administered 2011-06-02 – 2011-06-05 (×4): 30 mg via ORAL
  Filled 2011-06-01 (×5): qty 1

## 2011-06-01 MED ORDER — ZOLPIDEM TARTRATE 5 MG PO TABS: 5.0000 mg | ORAL_TABLET | Freq: Every evening | ORAL | Status: DC | PRN

## 2011-06-01 MED ORDER — DIPHENHYDRAMINE HCL 50 MG/ML IJ SOLN: 12.5000 mg | Freq: Four times a day (QID) | INTRAMUSCULAR | Status: DC | PRN

## 2011-06-01 MED ORDER — CYCLOSPORINE 0.05 % OP EMUL
1.0000 [drp] | Freq: Every day | OPHTHALMIC | Status: DC
  Administered 2011-06-01 – 2011-06-05 (×5): 1 [drp] via OPHTHALMIC
  Filled 2011-06-01 (×6): qty 1

## 2011-06-01 MED ORDER — METOCLOPRAMIDE HCL 10 MG PO TABS: 5.0000 mg | ORAL_TABLET | Freq: Three times a day (TID) | ORAL | Status: DC | PRN

## 2011-06-01 MED ORDER — SODIUM CHLORIDE 0.9 % IV SOLN
INTRAVENOUS | Status: DC
  Administered 2011-06-01: 1000 mL via INTRAVENOUS
  Administered 2011-06-02 (×2): via INTRAVENOUS

## 2011-06-01 MED ORDER — SUFENTANIL CITRATE 50 MCG/ML IV SOLN
INTRAVENOUS | Status: DC | PRN
  Administered 2011-06-01: 10 ug via INTRAVENOUS
  Administered 2011-06-01 (×2): 5 ug via INTRAVENOUS
  Administered 2011-06-01: 10 ug via INTRAVENOUS
  Administered 2011-06-01: 15 ug via INTRAVENOUS

## 2011-06-01 MED ORDER — ONDANSETRON HCL 4 MG/2ML IJ SOLN
INTRAMUSCULAR | Status: DC | PRN
  Administered 2011-06-01: 4 mg via INTRAVENOUS

## 2011-06-01 MED ORDER — DOCUSATE SODIUM 100 MG PO CAPS
100.0000 mg | ORAL_CAPSULE | Freq: Two times a day (BID) | ORAL | Status: DC
  Administered 2011-06-01 – 2011-06-05 (×9): 100 mg via ORAL
  Filled 2011-06-01 (×8): qty 1

## 2011-06-01 MED ORDER — METOCLOPRAMIDE HCL 5 MG/ML IJ SOLN
5.0000 mg | Freq: Three times a day (TID) | INTRAMUSCULAR | Status: DC | PRN
Start: 2011-06-01 — End: 2011-06-05

## 2011-06-01 MED ORDER — MAGNESIUM OXIDE 400 MG PO TABS
400.0000 mg | ORAL_TABLET | Freq: Every day | ORAL | Status: DC
Start: 2011-06-01 — End: 2011-06-01

## 2011-06-01 MED ORDER — METHOCARBAMOL 500 MG PO TABS
500.0000 mg | ORAL_TABLET | Freq: Four times a day (QID) | ORAL | Status: DC | PRN
  Administered 2011-06-02 – 2011-06-05 (×5): 500 mg via ORAL
  Filled 2011-06-01 (×5): qty 1

## 2011-06-01 MED ORDER — MORPHINE SULFATE 2 MG/ML IJ SOLN: 1.0000 mg | INTRAMUSCULAR | Status: DC | PRN

## 2011-06-01 MED ORDER — GLYCOPYRROLATE 0.2 MG/ML IJ SOLN
INTRAMUSCULAR | Status: DC | PRN
  Administered 2011-06-01: .6 mg via INTRAVENOUS

## 2011-06-01 MED ORDER — NALOXONE HCL 0.4 MG/ML IJ SOLN: 0.4000 mg | INTRAMUSCULAR | Status: DC | PRN

## 2011-06-01 MED ORDER — LACTATED RINGERS IV SOLN: INTRAVENOUS | Status: DC

## 2011-06-01 MED ORDER — CISATRACURIUM BESYLATE 2 MG/ML IV SOLN
INTRAVENOUS | Status: DC | PRN
  Administered 2011-06-01: 10 mg via INTRAVENOUS

## 2011-06-01 MED ORDER — POTASSIUM 95 MG PO TABS
99.0000 mg | ORAL_TABLET | Freq: Every day | ORAL | Status: DC
  Administered 2011-06-01 – 2011-06-05 (×5): 99 mg via ORAL
  Filled 2011-06-01 (×6): qty 1

## 2011-06-01 MED ORDER — FENTANYL CITRATE 0.05 MG/ML IJ SOLN
25.0000 ug | INTRAMUSCULAR | Status: DC | PRN
  Administered 2011-06-01 (×2): 25 ug via INTRAVENOUS

## 2011-06-01 MED ORDER — ADULT MULTIVITAMIN W/MINERALS CH
1.0000 | ORAL_TABLET | Freq: Every day | ORAL | Status: DC
  Administered 2011-06-01 – 2011-06-05 (×5): 1 via ORAL
  Filled 2011-06-01 (×5): qty 1

## 2011-06-01 MED ORDER — FENTANYL CITRATE 0.05 MG/ML IJ SOLN
INTRAMUSCULAR | Status: AC
  Filled 2011-06-01: qty 2

## 2011-06-01 MED ORDER — LACTATED RINGERS IV SOLN
INTRAVENOUS | Status: DC
  Administered 2011-06-01: 11:00:00 via INTRAVENOUS
  Administered 2011-06-01: 1000 mL via INTRAVENOUS

## 2011-06-01 MED ORDER — PHENOL 1.4 % MT LIQD: 1.0000 | OROMUCOSAL | Status: DC | PRN

## 2011-06-01 MED ORDER — BUPROPION HCL ER (XL) 150 MG PO TB24
150.0000 mg | ORAL_TABLET | Freq: Two times a day (BID) | ORAL | Status: DC
  Administered 2011-06-01 – 2011-06-05 (×9): 150 mg via ORAL
  Filled 2011-06-01 (×10): qty 1

## 2011-06-01 MED FILL — Cisatracurium Besylate (PF) IV Soln 10 MG/5ML (2 MG/ML): INTRAVENOUS | Qty: 5 | Status: AC

## 2011-06-01 SURGICAL SUPPLY — 36 items
BAG ZIPLOCK 12X15 (MISCELLANEOUS) ×4 IMPLANT
BLADE SAW SGTL 18X1.27X75 (BLADE) ×2 IMPLANT
CELLS DAT CNTRL 66122 CELL SVR (MISCELLANEOUS) ×1 IMPLANT
CLOTH BEACON ORANGE TIMEOUT ST (SAFETY) ×2 IMPLANT
DRAPE C-ARM 42X72 X-RAY (DRAPES) ×2 IMPLANT
DRAPE STERI IOBAN 125X83 (DRAPES) ×2 IMPLANT
DRAPE U-SHAPE 47X51 STRL (DRAPES) ×6 IMPLANT
DRSG MEPILEX BORDER 4X8 (GAUZE/BANDAGES/DRESSINGS) ×2 IMPLANT
DRSG XEROFORM 1X8 (GAUZE/BANDAGES/DRESSINGS) ×2 IMPLANT
DURAPREP 26ML APPLICATOR (WOUND CARE) ×2 IMPLANT
ELECT BLADE TIP CTD 4 INCH (ELECTRODE) ×2 IMPLANT
ELECT REM PT RETURN 9FT ADLT (ELECTROSURGICAL) ×2
ELECTRODE REM PT RTRN 9FT ADLT (ELECTROSURGICAL) ×1 IMPLANT
FACESHIELD LNG OPTICON STERILE (SAFETY) ×8 IMPLANT
GAUZE XEROFORM 1X8 LF (GAUZE/BANDAGES/DRESSINGS) ×2 IMPLANT
GLOVE BIO SURGEON STRL SZ7 (GLOVE) ×2 IMPLANT
GLOVE BIO SURGEON STRL SZ7.5 (GLOVE) ×2 IMPLANT
GLOVE BIOGEL PI IND STRL 7.5 (GLOVE) IMPLANT
GLOVE BIOGEL PI IND STRL 8 (GLOVE) ×1 IMPLANT
GLOVE BIOGEL PI INDICATOR 7.5 (GLOVE)
GLOVE BIOGEL PI INDICATOR 8 (GLOVE) ×1
GLOVE ECLIPSE 7.0 STRL STRAW (GLOVE) ×2 IMPLANT
GOWN STRL REIN XL XLG (GOWN DISPOSABLE) ×6 IMPLANT
KIT BASIN OR (CUSTOM PROCEDURE TRAY) ×2 IMPLANT
PACK TOTAL JOINT (CUSTOM PROCEDURE TRAY) ×2 IMPLANT
PADDING CAST COTTON 6X4 STRL (CAST SUPPLIES) ×2 IMPLANT
RTRCTR WOUND ALEXIS 18CM MED (MISCELLANEOUS) ×2
STAPLER SKIN PROX WIDE 3.9 (STAPLE) IMPLANT
SUT ETHIBOND NAB CT1 #1 30IN (SUTURE) ×2 IMPLANT
SUT VIC AB 1 CT1 36 (SUTURE) ×2 IMPLANT
SUT VIC AB 2-0 CT1 27 (SUTURE) ×1
SUT VIC AB 2-0 CT1 TAPERPNT 27 (SUTURE) ×1 IMPLANT
SUT VLOC 180 0 24IN GS25 (SUTURE) ×2 IMPLANT
TOWEL OR 17X26 10 PK STRL BLUE (TOWEL DISPOSABLE) ×4 IMPLANT
TOWEL OR NON WOVEN STRL DISP B (DISPOSABLE) ×2 IMPLANT
TRAY FOLEY CATH 14FRSI W/METER (CATHETERS) ×2 IMPLANT

## 2011-06-01 NOTE — H&P (Signed)
[name] is an 76 y.o. female.   Chief Complaint:   Severe left hip pain. 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  Her pain is constant and daily and she has had a decrease in function/mobility.  She wishes to proceed with a left total hip replacement given the failure of conservative treatment.  The risks are blood loss, nerve injury, fracture, DVT and PE.  The goals are decreased pain and improved function.  Past Medical History  Diagnosis Date  . Hypertension   . Arthritis   . Osteoporosis   . Headache   . Depression   . Glaucoma     Past Surgical History  Procedure Date  . Abdominal hysterectomy 2003  . Breast cysts     X2 BENIGN  .  cataracts removed     No family history on file. Social History:  reports that she quit smoking about 31 years ago. She does not have any smokeless tobacco history on file. She reports that she does not drink alcohol or use illicit drugs.  Allergies: No Known Allergies  Medications Prior to Admission  Medication Sig Dispense Refill  . buPROPion (WELLBUTRIN XL) 150 MG 24 hr tablet Take 150 mg by mouth 2 (two) times daily.      . carvedilol (COREG) 12.5 MG tablet Take 12.5 mg by mouth 2 (two) times daily with a meal.      . cycloSPORINE (RESTASIS) 0.05 % ophthalmic emulsion Place 1 drop into the right eye daily.      . furosemide (LASIX) 20 MG tablet Take 20 mg by mouth daily with breakfast.      . lisinopril (PRINIVIL,ZESTRIL) 30 MG tablet Take 30 mg by mouth daily with breakfast.      . magnesium oxide (MAG-OX) 400 MG tablet Take 400 mg by mouth daily.      . Multiple Vitamin (MULITIVITAMIN WITH MINERALS) TABS Take 1 tablet by mouth daily.      . naproxen sodium (ANAPROX) 220 MG tablet Take 440 mg by mouth 2 (two) times daily as needed. Pain      . nebivolol (BYSTOLIC) 5 MG tablet Take 5 mg by mouth daily with breakfast.      . Omega-3 Fatty Acids (FISH OIL) 1000 MG CAPS Take 1 capsule by  mouth daily.      . Potassium 99 MG TABS Take 99 mg by mouth daily.      . travoprost, benzalkonium, (TRAVATAN) 0.004 % ophthalmic solution Place 1 drop into the right eye at bedtime.        No results found for this or any previous visit (from the past 48 hour(s)). No results found.  Review of Systems  All other systems reviewed and are negative.    There were no vitals taken for this visit. Physical Exam  Constitutional: She is oriented to person, place, and time. She appears well-developed and well-nourished.  HENT:  Head: Normocephalic and atraumatic.  Eyes: EOM are normal. Pupils are equal, round, and reactive to light.  Neck: Normal range of motion. Neck supple.  Cardiovascular: Normal rate and regular rhythm.   Respiratory: Effort normal and breath sounds normal.  GI: Soft. Bowel sounds are normal.  Musculoskeletal:       Left hip: She exhibits decreased range of motion, decreased strength, bony tenderness and crepitus.  Neurological: She is alert and oriented to person, place, and time.  Skin: Skin is warm  and dry.  Psychiatric: She has a normal mood and affect.     Assessment/Plan To the OR today for a left total hip replacement.  Allan Bacigalupi Y 06/01/2011, 7:23 AM

## 2011-06-01 NOTE — Progress Notes (Signed)
CARE MANAGEMENT NOTE 06/01/2011  Patient:  [name], [name]   Account Number:  [id number]  Date Initiated:  06/01/2011  Documentation initiated by:  Colleen Can  Subjective/Objective Assessment:   dx anterior hip replacemnt -loeft     Action/Plan:   CM spoke with patient. She is requesting SNF rehab   Anticipated DC Date:  06/04/2011   Anticipated DC Plan:  SKILLED NURSING FACILITY  In-house referral  Clinical Social Worker      DC Planning Services  NA      [facility] Choice  NA   Choice offered to / List presented to:  NA   DME arranged  NA      DME agency  NA     HH arranged  NA      HH agency  NA   Status of service:  Completed, signed off Medicare Important Message given?  NA - LOS <3 / Initial given by admissions (If response is "NO", the following Medicare IM given date fields will be blank)

## 2011-06-01 NOTE — Progress Notes (Signed)
Patient reports falling in her driveway one week ago.  Ecchymotic areas on bilateral lower extremities near ankles. No open skin. Changed fall risk to high fall risk.
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
## 2011-06-01 NOTE — Brief Op Note (Signed)
06/01/2011  11:47 AM  PATIENT:  [name]  76 y.o. female  PRE-OPERATIVE DIAGNOSIS:  Severe arthritis left hip  POST-OPERATIVE DIAGNOSIS:  severe arthritis leeft hip  PROCEDURE:  Procedure(s) (LRB): TOTAL HIP ARTHROPLASTY ANTERIOR APPROACH (Left)  SURGEON:  Surgeon(s) and Role:    * Kathryne Hitch, MD - Primary  PHYSICIAN ASSISTANT:   ASSISTANTS: Maud Deed, PA-C   ANESTHESIA:   general  EBL:  Total I/O In: 1000 [I.V.:1000] Out: 625 [Urine:300; Blood:325]  BLOOD ADMINISTERED:none  DRAINS: none   LOCAL MEDICATIONS USED:  NONE  SPECIMEN:  No Specimen  DISPOSITION OF SPECIMEN:  N/A  COUNTS:  YES  TOURNIQUET:  * No tourniquets in log *  DICTATION: .Other Dictation: Dictation Number [id number]  PLAN OF CARE: Admit to inpatient   PATIENT DISPOSITION:  PACU - hemodynamically stable.   Delay start of Pharmacological VTE agent (>24hrs) due to surgical blood loss or risk of bleeding: no

## 2011-06-01 NOTE — Transfer of Care (Signed)
Immediate Anesthesia Transfer of Care Note  Patient: [name]  Procedure(s) Performed: Procedure(s) (LRB): TOTAL HIP ARTHROPLASTY ANTERIOR APPROACH (Left)  Patient Location: PACU  Anesthesia Type: General  Level of Consciousness: awake and alert   Airway & Oxygen Therapy: Patient Spontanous Breathing and Patient connected to face mask oxygen  Post-op Assessment: Report given to PACU RN and Post -op Vital signs reviewed and stable  Post vital signs: Reviewed and stable  Complications: No apparent anesthesia complications

## 2011-06-01 NOTE — Anesthesia Preprocedure Evaluation (Addendum)
Anesthesia Evaluation  Patient identified by MRN, date of birth, ID band Patient awake    Reviewed: Allergy & Precautions, H&P , NPO status , Patient's Chart, lab work & pertinent test results  Airway Mallampati: II TM Distance: >3 FB Neck ROM: Full    Dental  (+) Teeth Intact, Caps and Dental Advisory Given   Pulmonary neg pulmonary ROS,  breath sounds clear to auscultation  Pulmonary exam normal       Cardiovascular hypertension, Pt. on medications and Pt. on home beta blockers Rhythm:Regular Rate:Normal     Neuro/Psych  Headaches, PSYCHIATRIC DISORDERS Depression negative psych ROS   GI/Hepatic negative GI ROS, Neg liver ROS,   Endo/Other  negative endocrine ROS  Renal/GU negative Renal ROS  negative genitourinary   Musculoskeletal negative musculoskeletal ROS (+)   Abdominal   Peds negative pediatric ROS (+)  Hematology negative hematology ROS (+)   Anesthesia Other Findings   Reproductive/Obstetrics negative OB ROS                          Anesthesia Physical Anesthesia Plan  ASA: II  Anesthesia Plan: General   Post-op Pain Management:    Induction: Intravenous  Airway Management Planned: LMA  Additional Equipment:   Intra-op Plan:   Post-operative Plan: Extubation in OR  Informed Consent: I have reviewed the patients History and Physical, chart, labs and discussed the procedure including the risks, benefits and alternatives for the proposed anesthesia with the patient or authorized representative who has indicated his/her understanding and acceptance.   Dental advisory given  Plan Discussed with: CRNA  Anesthesia Plan Comments:         Anesthesia Quick Evaluation

## 2011-06-01 NOTE — Anesthesia Postprocedure Evaluation (Signed)
Anesthesia Post Note  Patient: [name]  Procedure(s) Performed: Procedure(s) (LRB): TOTAL HIP ARTHROPLASTY ANTERIOR APPROACH (Left)  Anesthesia type: General  Patient location: PACU  Post pain: Pain level controlled  Post assessment: Post-op Vital signs reviewed  Last Vitals:  Filed Vitals:   06/01/11 1210  BP:   Pulse:   Temp: 36.1 C  Resp:     Post vital signs: Reviewed  Level of consciousness: sedated  Complications: No apparent anesthesia complications

## 2011-06-01 NOTE — Preoperative (Signed)
Beta Blockers   Reason not to administer Beta Blockers:Not Applicable 

## 2011-06-01 NOTE — OR Nursing (Signed)
Late entry: depuy sales rep added to room staff. 06/01/2010 @ 1532. J.Kiva Norland, rn

## 2011-06-01 NOTE — H&P (Signed)
  I have seen and examined [name] and she does wish to proceed with a left total hip replacement.  She understands the risks and benefits involved and gives informed consent.  There has been no interval change in her health status.

## 2011-06-02 LAB — BASIC METABOLIC PANEL
BUN: 12 mg/dL (ref 6–23)
CO2: 26 mEq/L (ref 19–32)
Calcium: 8.1 mg/dL — ABNORMAL LOW (ref 8.4–10.5)
Chloride: 104 mEq/L (ref 96–112)
Creatinine, Ser: 0.83 mg/dL (ref 0.50–1.10)
GFR calc Af Amer: 79 mL/min — ABNORMAL LOW (ref >90)
GFR calc non Af Amer: 68 mL/min — ABNORMAL LOW (ref >90)
Glucose, Bld: 138 mg/dL — ABNORMAL HIGH (ref 70–99)
Potassium: 4.2 mEq/L (ref 3.5–5.1)
Sodium: 137 mEq/L (ref 135–145)

## 2011-06-02 LAB — CBC
HCT: 29.4 % — ABNORMAL LOW (ref 36.0–46.0)
Hemoglobin: 10.1 g/dL — ABNORMAL LOW (ref 12.0–15.0)
MCH: 32.4 pg (ref 26.0–34.0)
MCHC: 34.4 g/dL (ref 30.0–36.0)
MCV: 94.2 fL (ref 78.0–100.0)
Platelets: 181 10*3/uL (ref 150–400)
RBC: 3.12 MIL/uL — ABNORMAL LOW (ref 3.87–5.11)
RDW: 12.6 % (ref 11.5–15.5)
WBC: 6.9 10*3/uL (ref 4.0–10.5)

## 2011-06-02 MED ORDER — CEFAZOLIN SODIUM 1-5 GM-% IV SOLN
1.0000 g | Freq: Three times a day (TID) | INTRAVENOUS | Status: DC
  Administered 2011-06-02 (×2): 1 g via INTRAVENOUS
  Filled 2011-06-02 (×3): qty 50

## 2011-06-02 MED ORDER — CEFAZOLIN SODIUM 1-5 GM-% IV SOLN
1.0000 g | Freq: Four times a day (QID) | INTRAVENOUS | Status: AC
  Administered 2011-06-02: 1 g via INTRAVENOUS
  Filled 2011-06-02: qty 50

## 2011-06-02 NOTE — Op Note (Signed)
NAMEGRIER, CZERWINSKI NO.:  [id number]  MEDICAL RECORD NO.:  [id number]  LOCATION:  1611                         FACILITY:  [facility]  PHYSICIAN:  Vanita Panda. Magnus Ivan, M.D.DATE OF BIRTH:  1935/05/02  DATE OF PROCEDURE:  06/01/2011 DATE OF DISCHARGE:                              OPERATIVE REPORT   PREOPERATIVE DIAGNOSES:  End-stage arthritis and degenerative joint disease of left hip.  POSTOPERATIVE DIAGNOSES:  End-stage arthritis and degenerative joint disease of left hip.  PROCEDURE:  Left total hip arthroplasty through direct anterior approach.  IMPLANTS:  DePuy Sector Gription acetabular component size 52, size 36 + 4 neutral polyethylene liner, Corail femoral component size 9 with standard offset, size 36 + 1.5 metal hip ball.  SURGEON:  Vanita Panda. Magnus Ivan, M.D.  ANESTHESIA:  General.  BLOOD LOSS:  Less than 500 cc.  ASSISTANT:  Wende Neighbors, P.A., who was present and assisted in the entire case and her assistance was greatly needed.  ANTIBIOTICS:  2 g IV Ancef.  COMPLICATIONS:  None.  INDICATIONS:  Ms. [name] is a 76 year old female, well known to me.  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  She has failed conservative therapy and treatment.  She walks with a cane and her activities of daily living and mobility have been poor.  She had severe pain.  She wished to proceed with a total hip arthroplasty.  DESCRIPTION OF PROCEDURE:  After informed consent was obtained, appropriate left hip was marked.  She was brought to the operating room. General anesthesia was obtained while she was on her stretcher.  A Foley catheter was placed and then both feet were placed in traction boots. She was then placed on the Hana fracture table with perineal post in place and both legs put in traction devices.  No traction applied.  A time-out was called to identify the correct patient, correct left hip. I  then made an incision just posterior and distal to the anterior superior iliac spine and carried this obliquely down the leg.  I dissected down to the tensor fascia lata, which was divided obliquely. I then proceeded with a direct anterior approach to the hip.  Retractors were placed over the lateral neck and up underneath the rectus femoris at the medial neck, I identified the lateral femoral circumflex vessels and coagulated these.  I then divided the hip capsule in a leaf-type format to placed the Cobra retractors within the hip capsule.  There was abundant amount of fluid in the joint as well.  I then made my femoral neck cut level just above the calcar and finished this with an osteotome.  I then removed the femoral head in its entirety.  It had no cartilage left on the femoral head and the acetabulum had significant debris as well as including the acetabular debris and remaining remnants of labrum.  At that time, was placed medial and a Cobra lateral.  I then began reaming from a size 44 reamer in 2 mm increments up to a size 52 with last reamer placed under direct visualization and fluoroscopic guidance, so we could obtain our inclination abduction and  inversion.  I then placed a real size-54 acetabular component with Gription.  I knocked this into place with a mallet under direct visualization and fluoroscopy was very pleased with malalignment.  It was solid and so I placed a hole eliminator guide and a 36 + 4 neutral polyethylene liner. All traction was off the leg and we externally rotated the femur to 90 degrees, extended and adducted it.  This allowed access to the femoral canal.  Retractors were placed medially as well as behind the greater trochanter.  Now released the piriformis to raise the leg up higher.  I then used a box cutting guide followed by broaching and could only broach up to a size 9 from a size 8-9 due to the thickness of the cortices and retained canal.  I  then trialed a standard neck and a 36 + 1.4 hip ball, start +1.5 hip ball and reduced this into acetabulum and it was stable with minimal shock.  We could externally rotate it to 90 degrees without dislocating her and internally rotated to 45 degrees. We measured leg length under direct fluoroscopic guidance and she did on equal.  I then removed all trial instrumentation, placed the real HA- coated femoral component size 9 with a collar followed by the real 36 + 1.5 metal hip ball.  We reduced this in acetabulum and again, it was stable and leg lengths felt to be equal.  We copiously irrigated the soft tissue with normal saline solution and then closed the joint capsule with interrupted #1 Ethibond suture, a running 0 V-Loc suture was used to close the tensor fascia, followed by 2-0 in the subcutaneous tissue, and interrupted staples on the skin.  Xeroform followed by well- padded sterile dressing was applied, and she was taken off the table, awakened, extubated into the recovery room in stable condition.  All final counts correct and there no complications noted.     Vanita Panda. Magnus Ivan, M.D.     CYB/MEDQ  D:  06/01/2011  T:  06/02/2011  Job:  [id number]

## 2011-06-02 NOTE — Progress Notes (Signed)
Subjective: Pt stable - pain controlled   Objective: Vital signs in last 24 hours: Temp:  [97 F (36.1 C)-98.4 F (36.9 C)] 97.6 F (36.4 C) (05/04 0540) Pulse Rate:  [61-73] 73  (05/04 0540) Resp:  [12-99] 18  (05/04 0923) BP: (105-178)/(65-91) 106/65 mmHg (05/04 0540) SpO2:  [2 %-100 %] 99 % (05/04 0923) Weight:  [79.833 kg (176 lb)] 79.833 kg (176 lb) (05/03 1345)  Intake/Output from previous day: 05/03 0701 - 05/04 0700 In: 3850 [P.O.:600; I.V.:3150; IV Piggyback:100] Out: 1975 [Urine:1650; Blood:325] Intake/Output this shift:    Exam:  Neurovascular intact Sensation intact distally Dorsiflexion/Plantar flexion intact  Labs:  Basename 06/02/11 0435  HGB 10.1*    Basename 06/02/11 0435  WBC 6.9  RBC 3.12*  HCT 29.4*  PLT 181    Basename 06/02/11 0435  NA 137  K 4.2  CL 104  CO2 26  BUN 12  CREATININE 0.83  GLUCOSE 138*  CALCIUM 8.1*   No results found for this basename: LABPT:2,INR:2 in the last 72 hours  Assessment/Plan: Pt doing well - mobilize with PT - dc next week   [name] 06/02/2011, 9:53 AM

## 2011-06-02 NOTE — Evaluation (Signed)
Physical Therapy Evaluation Patient Details Name: [name] MRN: [id number] DOB: September 27, 1935 Today's Date: 06/02/2011 Time: 1107-1140 PT Time Calculation (min): 33 min  PT Assessment / Plan / Recommendation Clinical Impression  Pt with L THR presents with decreased L LE strength/ROM and limited functional mobility.      PT Assessment  Patient needs continued PT services    Follow Up Recommendations  Home health PT    Equipment Recommendations  Defer to next venue    Frequency 7X/week    Precautions / Restrictions Restrictions Weight Bearing Restrictions: No Other Position/Activity Restrictions: WBAT         Mobility  Bed Mobility Bed Mobility: Supine to Sit Supine to Sit: 1: +2 Total assist Supine to Sit: Patient Percentage: 60% Details for Bed Mobility Assistance: cues for sequence and for use of UEs to self assist Transfers Transfers: Sit to Stand;Stand to Sit Sit to Stand: 1: +2 Total assist Sit to Stand: Patient Percentage: 70% Stand to Sit: 1: +2 Total assist Stand to Sit: Patient Percentage: 70% Details for Transfer Assistance: cues for use of UEs and for LE management Ambulation/Gait Ambulation/Gait Assistance: 1: +2 Total assist Ambulation/Gait: Patient Percentage: 70 Ambulation Distance (Feet): 20 Feet Assistive device: Rolling walker Ambulation/Gait Assistance Details: cues for posture, sequence, and position from RW Gait Pattern: Step-to pattern    Exercises Total Joint Exercises Ankle Circles/Pumps: AROM;10 reps;Supine;Both Quad Sets: AROM;10 reps;Both;Supine Heel Slides: AAROM;10 reps;Supine;Left Hip ABduction/ADduction: AAROM;10 reps;Supine;Left   PT Goals Acute Rehab PT Goals PT Goal Formulation: With patient Time For Goal Achievement: 06/09/11 Potential to Achieve Goals: Good Pt will go Supine/Side to Sit: with supervision PT Goal: Supine/Side to Sit - Progress: Goal set today Pt will go Sit to Supine/Side: with supervision PT Goal: Sit  to Supine/Side - Progress: Goal set today Pt will go Sit to Stand: with supervision PT Goal: Sit to Stand - Progress: Goal set today Pt will go Stand to Sit: with supervision PT Goal: Stand to Sit - Progress: Goal set today Pt will Ambulate: 51 - 150 feet;with supervision;with rolling walker PT Goal: Ambulate - Progress: Goal set today  Visit Information  Last PT Received On: 06/02/11 Assistance Needed: +2    Subjective Data  Subjective: I'm feeling a little light headed Patient Stated Goal: Rehab and then resume previous lifestyle with decreased pain   Prior Functioning  Home Living Lives With: Alone Prior Function Level of Independence: Independent Able to Take Stairs?: Yes Driving: Yes Communication Communication: No difficulties    Cognition  Arousal/Alertness: Awake/alert Orientation Level: Appears intact for tasks assessed Behavior During Session: [facility] for tasks performed    Extremity/Trunk Assessment Right Upper Extremity Assessment RUE ROM/Strength/Tone: Within functional levels Left Upper Extremity Assessment LUE ROM/Strength/Tone: Within functional levels Right Lower Extremity Assessment RLE ROM/Strength/Tone: Within functional levels Left Lower Extremity Assessment LLE ROM/Strength/Tone: Deficits LLE ROM/Strength/Tone Deficits: Hip strength 2+/5; quads 3/5; AAROM hip to 80*   Balance    End of Session PT - End of Session Equipment Utilized During Treatment: Gait belt Activity Tolerance: Patient tolerated treatment well Patient left: in chair;with call bell/phone within reach;with family/visitor present Nurse Communication: Mobility status   Dierks Wach 06/02/2011, 1:23 PM

## 2011-06-02 NOTE — Progress Notes (Signed)
OT  Note  OT to eval and tx on 06/03/11 in the AM. Spoke with PT Hunter and pt currently requires total+2 (A) thus not appropriate for OT evaluation today .  Ot to follow acutely    Lucile Shutters   OTR/L Pager: [phone] Office: [phone] .

## 2011-06-02 NOTE — Progress Notes (Signed)
Upon giving midnight medications, I noticed that the patient did not have any abx ordered. On call MD paged for [facility] and spoke to Dr. August Saucer with orders to Ancef 1g q8 hours for 3 doses. Will continue to monitor patient.

## 2011-06-02 NOTE — Progress Notes (Signed)
Physical Therapy Treatment Patient Details Name: [name] MRN: [id number] DOB: 25-Oct-1935 Today's Date: 06/02/2011 Time: 6295-2841 PT Time Calculation (min): 32 min  PT Assessment / Plan / Recommendation Comments on Treatment Session       Follow Up Recommendations  Home health PT    Equipment Recommendations  Defer to next venue    Frequency 7X/week   Plan Discharge plan remains appropriate    Precautions / Restrictions Restrictions Weight Bearing Restrictions: No Other Position/Activity Restrictions: WBAT   Pertinent Vitals/Pain Min c/o pain    Mobility  Bed Mobility Bed Mobility: Supine to Sit Supine to Sit: 1: +2 Total assist Supine to Sit: Patient Percentage: 60% Sit to Supine: 4: Min assist;3: Mod assist Details for Bed Mobility Assistance: cues for sequence and for use of UEs to self assist Transfers Transfers: Sit to Stand;Stand to Sit Sit to Stand: 4: Min assist;3: Mod assist Sit to Stand: Patient Percentage: 70% Stand to Sit: 4: Min assist;3: Mod assist Stand to Sit: Patient Percentage: 70% Details for Transfer Assistance: cues for use of UEs and for LE management Ambulation/Gait Ambulation/Gait Assistance: 4: Min assist;3: Mod assist Ambulation/Gait: Patient Percentage: 70 Ambulation Distance (Feet): 20 Feet (20' x 2) Assistive device: Rolling walker Ambulation/Gait Assistance Details: cues for sequence, posture and position from RW Gait Pattern: Step-to pattern    Exercises Total Joint Exercises Ankle Circles/Pumps: AROM;10 reps;Supine;Both Quad Sets: AROM;10 reps;Both;Supine Heel Slides: AAROM;10 reps;Supine;Left Hip ABduction/ADduction: AAROM;10 reps;Supine;Left   PT Goals Acute Rehab PT Goals PT Goal Formulation: With patient Time For Goal Achievement: 06/09/11 Potential to Achieve Goals: Good Pt will go Supine/Side to Sit: with supervision PT Goal: Supine/Side to Sit - Progress: Progressing toward goal Pt will go Sit to Supine/Side: with  supervision PT Goal: Sit to Supine/Side - Progress: Progressing toward goal Pt will go Sit to Stand: with supervision PT Goal: Sit to Stand - Progress: Progressing toward goal Pt will go Stand to Sit: with supervision PT Goal: Stand to Sit - Progress: Progressing toward goal Pt will Ambulate: 51 - 150 feet;with supervision;with rolling walker PT Goal: Ambulate - Progress: Progressing toward goal  Visit Information  Last PT Received On: 06/02/11 Assistance Needed: +1    Subjective Data  Subjective: I'm just a little dizzy Patient Stated Goal: Rehab and then resume previous lifestyle with decreased pain   Cognition  Overall Cognitive Status: Appears within functional limits for tasks assessed/performed Arousal/Alertness: Awake/alert Orientation Level: Appears intact for tasks assessed Behavior During Session: [facility] for tasks performed    Balance     End of Session PT - End of Session Equipment Utilized During Treatment: Gait belt Activity Tolerance: Patient tolerated treatment well Patient left: in bed;with call bell/phone within reach Nurse Communication: Mobility status    [name] 06/02/2011, 2:51 PM

## 2011-06-03 LAB — CBC
HCT: 28.4 % — ABNORMAL LOW (ref 36.0–46.0)
Hemoglobin: 9.6 g/dL — ABNORMAL LOW (ref 12.0–15.0)
MCH: 32.3 pg (ref 26.0–34.0)
MCHC: 33.8 g/dL (ref 30.0–36.0)
MCV: 95.6 fL (ref 78.0–100.0)
Platelets: 163 10*3/uL (ref 150–400)
RBC: 2.97 MIL/uL — ABNORMAL LOW (ref 3.87–5.11)
RDW: 12.9 % (ref 11.5–15.5)
WBC: 6.1 10*3/uL (ref 4.0–10.5)

## 2011-06-03 NOTE — Progress Notes (Signed)
Physical Therapy Treatment Patient Details Name: [name] MRN: [id number] DOB: [birth year] Today's Date: 06/03/2011 Time: 5621-3086 PT Time Calculation (min): 26 min  PT Assessment / Plan / Recommendation Comments on Treatment Session  Able to increase distance with ambulation this afternoon.  Also tolerating exercises with minimal increase in pain.  Agree with SNF as patient with fall prior to admission.      Follow Up Recommendations  Skilled nursing facility    Equipment Recommendations  Defer to next venue    Frequency 7X/week   Plan Discharge plan remains appropriate    Precautions / Restrictions Restrictions Other Position/Activity Restrictions: WBAT   Pertinent Vitals/Pain 4/10 left hip with activity    Mobility  Transfers Transfers: Sit to Stand;Stand to Sit Sit to Stand: 4: Min assist;From chair/3-in-1;With armrests;With upper extremity assist Stand to Sit: With armrests;With upper extremity assist;To chair/3-in-1;4: Min assist Ambulation/Gait Ambulation/Gait Assistance: 4: Min guard Ambulation Distance (Feet): 60 Feet Assistive device: Rolling walker Ambulation/Gait Assistance Details: mantains appropriate sequence without cues, needed cues for turning, to decrease shoulder elevation and for posture  Gait Pattern: Step-to pattern;Decreased stride length;Antalgic    Exercises Total Joint Exercises Ankle Circles/Pumps: AROM;10 reps;Seated;Both Short Arc Quad: AROM;Left;10 reps;Seated Heel Slides: AAROM;Left;10 reps;Seated Hip ABduction/ADduction: AAROM;Left;10 reps;Seated   PT Goals Acute Rehab PT Goals Pt will go Sit to Stand: with supervision PT Goal: Sit to Stand - Progress: Progressing toward goal Pt will go Stand to Sit: with supervision PT Goal: Stand to Sit - Progress: Progressing toward goal Pt will Ambulate: 51 - 150 feet;with supervision;with rolling walker PT Goal: Ambulate - Progress: Progressing toward goal  Visit Information  Last PT  Received On: 06/03/11    Subjective Data  Subjective: Forgot to ask for help walking back from bathroom.  Had a fall getting newspaper just before coming here for surgery.   Cognition  Overall Cognitive Status: Appears within functional limits for tasks assessed/performed Arousal/Alertness: Awake/alert Orientation Level: Appears intact for tasks assessed Behavior During Session: [facility] for tasks performed    Balance     End of Session PT - End of Session Equipment Utilized During Treatment: Gait belt Activity Tolerance: Patient tolerated treatment well Patient left: in chair;with call bell/phone within reach    [facility] 06/03/2011, 2:36 PM

## 2011-06-03 NOTE — Evaluation (Signed)
Occupational Therapy Evaluation Patient Details Name: RAYYA YAGI MRN: [id number] DOB: 03/16/1935 Today's Date: 06/03/2011 Time: 7829-5621 OT Time Calculation (min): 34 min  OT Assessment / Plan / Recommendation Clinical Impression  76 yo female s/p Lt direct anterior hip replacement that could benefit from skilled Ot acutely. Pt with balance deficts that affect all ADLS    OT Assessment  Patient needs continued OT Services    Follow Up Recommendations  Skilled nursing facility [facility])    Equipment Recommendations  Defer to next venue    Frequency Min 2X/week    Precautions / Restrictions Precautions Precautions:  (DIRECT ANTERIOR ) Restrictions Weight Bearing Restrictions: No Other Position/Activity Restrictions: WBAT   Pertinent Vitals/Pain 8 out 10 pain pushing pca x1 Pt c/o difficulty voiding bladder at baseline Pt completed toilet transfer with prolonged sitting without voiding    ADL  Eating/Feeding: Performed;Set up Where Assessed - Eating/Feeding: Chair Grooming: Performed;Teeth care;Set up Where Assessed - Grooming: Standing at sink Lower Body Dressing: Simulated;Moderate assistance Where Assessed - Lower Body Dressing: Sit to stand from chair Toilet Transfer: Performed;Set up Toilet Transfer Method: Ambulating Toilet Transfer Equipment: Raised toilet seat with arms (or 3-in-1 over toilet) Toileting - Clothing Manipulation: Performed;Set up Where Assessed - Toileting Clothing Manipulation: Sit to stand from 3-in-1 or toilet Toileting - Hygiene: Simulated;Set up Where Assessed - Toileting Hygiene: Sit to stand from 3-in-1 or toilet Equipment Used: Gait belt;Rolling walker Ambulation Related to ADLs: Pt ambulated ~10 ft within room with decreased gait velocity. ADL Comments: Pt is adequate level for d/c to [facility] from a OT standpoint. PT will be followed to address bed mobility acutely.    OT Goals Acute Rehab OT Goals OT Goal Formulation: With patient Time  For Goal Achievement: 06/10/11 Potential to Achieve Goals: Good Miscellaneous OT Goals Miscellaneous OT Goal #1: Pt will complete bed mobility with Min (A) min v/c Hob 20 degrees or less no bedrail as precursor to ADLs at sink level OT Goal: Miscellaneous Goal #1 - Progress: Goal set today Miscellaneous OT Goal #2: Pt will demonstrate LB dressing/ bathing with AE with setup  OT Goal: Miscellaneous Goal #2 - Progress: Goal set today  Visit Information  Last OT Received On: 06/03/11 Assistance Needed: +1    Subjective Data  Subjective: "I am going to [facility] my understanding is they will have more of this over there" Patient Stated Goal: Go to rehab   Prior Functioning  Home Living Lives With: Alone Additional Comments: home environment questions not asked by therapist because pt preapproved to d/c to [facility] SNF Prior Function Level of Independence: Independent Able to Take Stairs?: Yes Driving: Yes Communication Communication: No difficulties Dominant Hand: Right    Cognition  Overall Cognitive Status: Appears within functional limits for tasks assessed/performed Arousal/Alertness: Awake/alert Orientation Level: Appears intact for tasks assessed Behavior During Session: [facility] for tasks performed    Extremity/Trunk Assessment Right Upper Extremity Assessment RUE ROM/Strength/Tone: Within functional levels RUE Coordination: WFL - gross/fine motor Left Upper Extremity Assessment LUE ROM/Strength/Tone: Within functional levels LUE Coordination: WFL - gross/fine motor   Mobility Bed Mobility Supine to Sit: 3: Mod assist;HOB elevated Details for Bed Mobility Assistance: assist for operative leg to edge of bed and to lift shoulders off supporting surface Transfers Transfers: Sit to Stand;Stand to Sit Sit to Stand: 4: Min assist;From chair/3-in-1;With upper extremity assist;From bed Stand to Sit: 4: Min assist;With upper extremity assist;To chair/3-in-1 Details for Transfer  Assistance: cues for technique and UE use from  supporting surface   Exercise    Balance Balance Balance Assessed: Yes Static Standing Balance Static Standing - Level of Assistance: 5: Stand by assistance  End of Session OT - End of Session Equipment Utilized During Treatment: Gait belt Activity Tolerance: Patient tolerated treatment well Patient left: in chair;with call bell/phone within reach Nurse Communication: Mobility status   Lucile Shutters 06/03/2011, 11:19 AM Pager: [phone]

## 2011-06-03 NOTE — Progress Notes (Signed)
Subjective: Pt stable - walking in room not yet in hall - pain controlled   Objective: Vital signs in last 24 hours: Temp:  [98 F (36.7 C)-99 F (37.2 C)] 98.7 F (37.1 C) (05/05 0626) Pulse Rate:  [65-78] 78  (05/05 0626) Resp:  [16-20] 20  (05/05 0800) BP: (122-146)/(73-79) 146/79 mmHg (05/05 0626) SpO2:  [96 %-100 %] 100 % (05/05 0800)  Intake/Output from previous day: 05/04 0701 - 05/05 0700 In: 466.7 [P.O.:240; I.V.:226.7] Out: 1000 [Urine:1000] Intake/Output this shift:    Exam:  Neurovascular intact Sensation intact distally Intact pulses distally  Labs:  Basename 06/03/11 0436 06/02/11 0435  HGB 9.6* 10.1*    Basename 06/03/11 0436 06/02/11 0435  WBC 6.1 6.9  RBC 2.97* 3.12*  HCT 28.4* 29.4*  PLT 163 181    Basename 06/02/11 0435  NA 137  K 4.2  CL 104  CO2 26  BUN 12  CREATININE 0.83  GLUCOSE 138*  CALCIUM 8.1*   No results found for this basename: LABPT:2,INR:2 in the last 72 hours  Assessment/Plan: Cont PT - dc pca - start oral pain meds   Fernie Grimm SCOTT 06/03/2011, 8:14 AM

## 2011-06-03 NOTE — Progress Notes (Signed)
Physical Therapy Treatment Patient Details Name: [name] MRN: [id number] DOB: 1935-06-13 Today's Date: 06/03/2011 Time: 0865-7846 PT Time Calculation (min): 30 min  PT Assessment / Plan / Recommendation Comments on Treatment Session  Patient fatigued after toileting and only able to walk back to chair.  Will return later in pm for increased ambulation as tolerated.    Follow Up Recommendations  Skilled nursing facility    Equipment Recommendations  Defer to next venue    Frequency 7X/week   Plan Discharge plan remains appropriate    Precautions / Restrictions Precautions Precautions:  (DIRECT ANTERIOR ) Restrictions Weight Bearing Restrictions: No Other Position/Activity Restrictions: WBAT   Pertinent Vitals/Pain 6/10 with activity    Mobility  Bed Mobility Supine to Sit: 3: Mod assist;HOB elevated Details for Bed Mobility Assistance: assist for operative leg to edge of bed and to lift shoulders off supporting surface Transfers Transfers: Stand to Sit;Sit to Stand Sit to Stand: 4: Min assist;From chair/3-in-1;With upper extremity assist;From bed Stand to Sit: 4: Min assist;With upper extremity assist;To chair/3-in-1 Details for Transfer Assistance: cues for technique and UE use from supporting surface Ambulation/Gait Ambulation/Gait Assistance: 4: Min assist Ambulation Distance (Feet): 10 Feet (10' x 2; to/from bathroom) Assistive device: Rolling walker Gait Pattern: Step-to pattern;Decreased hip/knee flexion - left;Antalgic    Exercises     PT Goals Acute Rehab PT Goals Pt will go Supine/Side to Sit: with supervision PT Goal: Supine/Side to Sit - Progress: Progressing toward goal Pt will go Sit to Stand: with supervision PT Goal: Sit to Stand - Progress: Progressing toward goal Pt will go Stand to Sit: with supervision PT Goal: Stand to Sit - Progress: Progressing toward goal Pt will Ambulate: 51 - 150 feet;with supervision;with rolling walker PT Goal:  Ambulate - Progress: Progressing toward goal  Visit Information  Last PT Received On: 06/03/11 Assistance Needed: +1    Subjective Data  Subjective: Need to use the bathroom   Cognition  Overall Cognitive Status: Appears within functional limits for tasks assessed/performed Arousal/Alertness: Awake/alert Orientation Level: Appears intact for tasks assessed Behavior During Session: [facility] for tasks performed    Balance     End of Session PT - End of Session Equipment Utilized During Treatment: Gait belt Activity Tolerance: Patient limited by fatigue Patient left: in chair;with call bell/phone within reach    [facility] 06/03/2011, 10:24 AM

## 2011-06-04 LAB — CBC
HCT: 28.5 % — ABNORMAL LOW (ref 36.0–46.0)
Hemoglobin: 9.8 g/dL — ABNORMAL LOW (ref 12.0–15.0)
MCH: 32.1 pg (ref 26.0–34.0)
MCHC: 34.4 g/dL (ref 30.0–36.0)
MCV: 93.4 fL (ref 78.0–100.0)
Platelets: 167 10*3/uL (ref 150–400)
RBC: 3.05 MIL/uL — ABNORMAL LOW (ref 3.87–5.11)
RDW: 12.6 % (ref 11.5–15.5)
WBC: 4.7 10*3/uL (ref 4.0–10.5)

## 2011-06-04 NOTE — Progress Notes (Signed)
Physical Therapy Treatment Patient Details Name: [name] MRN: [id number] DOB: Mar 02, 1935 Today's Date: 06/04/2011 Time: 1010-1036 PT Time Calculation (min): 26 min  PT Assessment / Plan / Recommendation Comments on Treatment Session  pt asking questions regarding D/C, cost of staying another night, etc; spoke to SW who states they just today received referral for STSNF; pt planning for [facility] since pre-op    Follow Up Recommendations  Skilled nursing facility    Equipment Recommendations  Defer to next venue    Frequency 7X/week   Plan Discharge plan remains appropriate    Precautions / Restrictions Restrictions Weight Bearing Restrictions: No   Pertinent Vitals/Pain     Mobility  Bed Mobility Supine to Sit: 4: Min assist;HOB elevated (very minimal A needed to move LLE to EOB.) Transfers Transfers: Sit to Stand;Stand to Sit Sit to Stand: 4: Min guard;With armrests;With upper extremity assist;From chair/3-in-1 Stand to Sit: 4: Min guard;To chair/3-in-1;With upper extremity assist;With armrests Details for Transfer Assistance: cues for use of UEs and for LE management Ambulation/Gait Ambulation/Gait Assistance: 4: Min guard Ambulation Distance (Feet): 130 Feet Assistive device: Rolling walker Ambulation/Gait Assistance Details:   cues for posture and position from RW; able to self correct sequence Gait Pattern: Step-to pattern;Decreased stride length;Antalgic    Exercises Total Joint Exercises Ankle Circles/Pumps: AROM;10 reps;Seated;Both Quad Sets: AROM;15 reps;Both Heel Slides: AROM;AAROM;15 reps;Left Hip ABduction/ADduction: AAROM;Left;10 reps   PT Goals Acute Rehab PT Goals Time For Goal Achievement: 06/09/11 Potential to Achieve Goals: Good Pt will go Sit to Stand: with supervision PT Goal: Sit to Stand - Progress: Progressing toward goal Pt will go Stand to Sit: with supervision PT Goal: Stand to Sit - Progress: Progressing toward goal Pt will  Ambulate: 51 - 150 feet;with supervision;with rolling walker PT Goal: Ambulate - Progress: Progressing toward goal  Visit Information  Last PT Received On: 06/04/11 Assistance Needed: +1    Subjective Data  Subjective: I can't have those hip precautions, how will I be able to get my spanks on?! Patient Stated Goal: I want to lose weight, be able to exercise again   Cognition  Overall Cognitive Status: Appears within functional limits for tasks assessed/performed Arousal/Alertness: Awake/alert Orientation Level: Appears intact for tasks assessed Behavior During Session: [facility] for tasks performed    Balance  Balance Balance Assessed: Yes Static Standing Balance Static Standing - Level of Assistance: 5: Stand by assistance  End of Session PT - End of Session Equipment Utilized During Treatment: Gait belt Activity Tolerance: Patient tolerated treatment well Patient left: in chair;with call bell/phone within reach    [facility] 06/04/2011, 10:41 AM

## 2011-06-04 NOTE — Progress Notes (Signed)
Subjective: 3 Days Post-Op Procedure(s) (LRB): TOTAL HIP ARTHROPLASTY ANTERIOR APPROACH (Left) Patient reports pain as mild.  Asymptomatic acute blood loss anemia.  Objective: Vital signs in last 24 hours: Temp:  [97.4 F (36.3 C)-98.3 F (36.8 C)] 97.4 F (36.3 C) (05/05 2120) Pulse Rate:  [73-81] 73  (05/05 2120) Resp:  [18-20] 18  (05/05 2120) BP: (118-134)/(71-75) 134/71 mmHg (05/05 2120) SpO2:  [94 %-100 %] 94 % (05/05 2120)  Intake/Output from previous day: 05/05 0701 - 05/06 0700 In: 480 [P.O.:480] Out: 2300 [Urine:2300] Intake/Output this shift: Total I/O In: -  Out: 250 [Urine:250]   Basename 06/04/11 0403 06/03/11 0436 06/02/11 0435  HGB 9.8* 9.6* 10.1*    Basename 06/04/11 0403 06/03/11 0436  WBC 4.7 6.1  RBC 3.05* 2.97*  HCT 28.5* 28.4*  PLT 167 163    Basename 06/02/11 0435  NA 137  K 4.2  CL 104  CO2 26  BUN 12  CREATININE 0.83  GLUCOSE 138*  CALCIUM 8.1*   No results found for this basename: LABPT:2,INR:2 in the last 72 hours  Neurovascular intact Sensation intact distally Intact pulses distally Dorsiflexion/Plantar flexion intact Incision: scant drainage  Assessment/Plan: 3 Days Post-Op Procedure(s) (LRB): TOTAL HIP ARTHROPLASTY ANTERIOR APPROACH (Left) Discharge to SNF tomorrow. Social Work Consult for [employer] Patient has talked to [employer] 06/04/2011, 6:36 AM

## 2011-06-04 NOTE — Progress Notes (Signed)
Clinical Social Work Department CLINICAL SOCIAL WORK PLACEMENT NOTE 06/04/2011  Patient:  [name], [name]  Account Number:  [id number] Admit date:  06/01/2011  Clinical Social Worker:  Cori Razor, LCSW  Date/time:  06/04/2011 01:27 PM  Clinical Social Work is seeking post-discharge placement for this patient at the following level of care:   SKILLED NURSING   (*CSW will update this form in Epic as items are completed)     Patient/family provided with [facility] Department of Clinical Social Work's list of facilities offering this level of care within the geographic area requested by the patient (or if unable, by the patient's family).    Patient/family informed of their freedom to choose among providers that offer the needed level of care, that participate in Medicare, Medicaid or managed care program needed by the patient, have an available bed and are willing to accept the patient.    Patient/family informed of [facility]' ownership interest in [facility], as well as of the fact that they are under no obligation to receive care at this facility.  PASARR submitted to EDS on 06/04/2011 PASARR number received from EDS on 06/04/2011  FL2 transmitted to all facilities in geographic area requested by pt/family on  06/04/2011 FL2 transmitted to all facilities within larger geographic area on   Patient informed that his/her managed care company has contracts with or will negotiate with  certain facilities, including the following:     Patient/family informed of bed offers received:  06/04/2011 Patient chooses bed at [facility] Physician recommends and patient chooses bed at    Patient to be transferred to [facility] on   Patient to be transferred to facility by   The following physician request were entered in Epic:   Additional Comments:  Cori Razor  LCSW  [phone]

## 2011-06-04 NOTE — Progress Notes (Signed)
Occupational Therapy Treatment Patient Details Name: [name] MRN: [id number] DOB: [birth year] Today's Date: 06/04/2011 Time: 0910-0930 OT Time Calculation (min): 20 min  OT Assessment / Plan / Recommendation Comments on Treatment Session Pt plan is to d/c to [facility].    Follow Up Recommendations  Skilled nursing facility    Equipment Recommendations  Defer to next venue    Frequency Min 2X/week   Plan Discharge plan remains appropriate    Precautions / Restrictions Restrictions Weight Bearing Restrictions: No   Pertinent Vitals/Pain     ADL  Upper Body Bathing: Performed;Set up Where Assessed - Upper Body Bathing: Sitting, chair;Unsupported Lower Body Bathing: Performed (Minguard A for front and back peri areas) Where Assessed - Lower Body Bathing: Sit to stand from chair Upper Body Dressing: Performed;Set up Where Assessed - Upper Body Dressing: Sitting, chair;Unsupported Toilet Transfer: Performed;Other (comment) (Minguard A ) Toilet Transfer Method: Proofreader: Raised toilet seat with arms (or 3-in-1 over toilet) Toileting - Clothing Manipulation: Performed;Supervision/safety Where Assessed - Toileting Clothing Manipulation: Sit to stand from 3-in-1 or toilet Toileting - Hygiene: Performed;Supervision/safety Where Assessed - Toileting Hygiene: Sit to stand from 3-in-1 or toilet Ambulation Related to ADLs: Pt ambulated to bathroom with minguard A    OT Goals Miscellaneous OT Goals OT Goal: Miscellaneous Goal #1 - Progress: Progressing toward goals OT Goal: Miscellaneous Goal #2 - Progress: Progressing toward goals  Visit Information  Last OT Received On: 06/04/11 Assistance Needed: +1    Subjective Data  Subjective: "I'm not leaving until tomorrow."   Prior Functioning       Cognition  Overall Cognitive Status: Appears within functional limits for tasks assessed/performed Arousal/Alertness: Awake/alert Orientation  Level: Appears intact for tasks assessed Behavior During Session: [facility] for tasks performed    Mobility Bed Mobility Supine to Sit: 4: Min assist;HOB elevated (very minimal A needed to move LLE to EOB.) Transfers Sit to Stand: 4: Min guard;With upper extremity assist;From bed;From chair/3-in-1 Stand to Sit: 4: Min guard;With upper extremity assist;To chair/3-in-1;With armrests Details for Transfer Assistance: Minimal VCs needed for hand placement, technique and LLE position.   Exercises    Balance Balance Balance Assessed: Yes Static Standing Balance Static Standing - Level of Assistance: 5: Stand by assistance  End of Session OT - End of Session Activity Tolerance: Patient tolerated treatment well Patient left: in chair;with call bell/phone within reach   Schneur Crowson A OTR/L [phone] 06/04/2011, 9:36 AM

## 2011-06-04 NOTE — Progress Notes (Signed)
Clinical Social Work Department BRIEF PSYCHOSOCIAL ASSESSMENT 06/04/2011  Patient:  TNIYAH, NAKAGAWA     Account Number:  [id number]     Admit date:  06/01/2011  Clinical Social Worker:  Candie Chroman  Date/Time:  06/04/2011 01:15 PM  Referred by:  Physician  Date Referred:  06/04/2011 Referred for  SNF Placement   Other Referral:   Interview type:  Patient Other interview type:    PSYCHOSOCIAL DATA Living Status:  ALONE Admitted from facility:   Level of care:   Primary support name:  Heywood Iles Primary support relationship to patient:  FRIEND Degree of support available:   unclear    CURRENT CONCERNS Current Concerns  Post-Acute Placement   Other Concerns:    SOCIAL WORK ASSESSMENT / PLAN Pt is a 76 yr old female living at home prior to hospitalization. Met with pt to assist with d/c planning. Pt has made arrangements for ST rehab at [facility] upon d/c. SNF has confirmed plan. CSW will assist with d/c planning to SNF.   Assessment/plan status:  Psychosocial Support/Ongoing Assessment of Needs Other assessment/ plan:   Information/referral to community resources:    PATIENT'S/FAMILY'S RESPONSE TO PLAN OF CARE: Pt is looking forward to ST Rehab at [facility].   Cori Razor  LCSW  [phone]

## 2011-06-05 ENCOUNTER — Encounter (HOSPITAL_COMMUNITY): Payer: Self-pay | Admitting: Orthopaedic Surgery

## 2011-06-05 DIAGNOSIS — M7989 Other specified soft tissue disorders: Secondary | ICD-10-CM | POA: Diagnosis not present

## 2011-06-05 DIAGNOSIS — Z96649 Presence of unspecified artificial hip joint: Secondary | ICD-10-CM | POA: Diagnosis not present

## 2011-06-05 DIAGNOSIS — M810 Age-related osteoporosis without current pathological fracture: Secondary | ICD-10-CM | POA: Diagnosis not present

## 2011-06-05 DIAGNOSIS — F329 Major depressive disorder, single episode, unspecified: Secondary | ICD-10-CM | POA: Diagnosis not present

## 2011-06-05 DIAGNOSIS — H409 Unspecified glaucoma: Secondary | ICD-10-CM | POA: Diagnosis not present

## 2011-06-05 DIAGNOSIS — S79919A Unspecified injury of unspecified hip, initial encounter: Secondary | ICD-10-CM | POA: Diagnosis not present

## 2011-06-05 DIAGNOSIS — Z5189 Encounter for other specified aftercare: Secondary | ICD-10-CM | POA: Diagnosis not present

## 2011-06-05 DIAGNOSIS — I10 Essential (primary) hypertension: Secondary | ICD-10-CM | POA: Diagnosis not present

## 2011-06-05 DIAGNOSIS — M169 Osteoarthritis of hip, unspecified: Secondary | ICD-10-CM | POA: Diagnosis not present

## 2011-06-05 DIAGNOSIS — M79609 Pain in unspecified limb: Secondary | ICD-10-CM | POA: Diagnosis not present

## 2011-06-05 DIAGNOSIS — M1990 Unspecified osteoarthritis, unspecified site: Secondary | ICD-10-CM | POA: Diagnosis not present

## 2011-06-05 DIAGNOSIS — E876 Hypokalemia: Secondary | ICD-10-CM | POA: Diagnosis not present

## 2011-06-05 DIAGNOSIS — M25559 Pain in unspecified hip: Secondary | ICD-10-CM | POA: Diagnosis not present

## 2011-06-05 DIAGNOSIS — M1610 Unilateral primary osteoarthritis, unspecified hip: Secondary | ICD-10-CM | POA: Diagnosis not present

## 2011-06-05 DIAGNOSIS — D62 Acute posthemorrhagic anemia: Secondary | ICD-10-CM | POA: Diagnosis not present

## 2011-06-05 MED ORDER — METHOCARBAMOL 500 MG PO TABS: 500.0000 mg | ORAL_TABLET | Freq: Four times a day (QID) | ORAL | Status: AC | PRN

## 2011-06-05 MED ORDER — RIVAROXABAN 10 MG PO TABS: 10.0000 mg | ORAL_TABLET | Freq: Every day | ORAL | Status: DC

## 2011-06-05 MED ORDER — OXYCODONE-ACETAMINOPHEN 5-325 MG PO TABS: 1.0000 | ORAL_TABLET | ORAL | Status: AC | PRN

## 2011-06-05 NOTE — Discharge Summary (Signed)
Patient ID: [name] MRN: [id number] DOB/AGE: [birth year] 74 y.o.  Admit date: 06/01/2011 Discharge date: 06/05/2011  Admission Diagnoses:  Principal Problem:  *Degenerative arthritis of hip   Discharge Diagnoses:  Same  Past Medical History  Diagnosis Date  . Hypertension   . Arthritis   . Osteoporosis   . Headache   . Depression   . Glaucoma     Surgeries: Procedure(s): TOTAL HIP ARTHROPLASTY ANTERIOR APPROACH on 06/01/2011   Consultants:    Discharged Condition: Improved  Hospital Course: YICEL SHANNON is an 76 y.o. female who was admitted 06/01/2011 for operative treatment ofDegenerative arthritis of hip. Patient has severe unremitting pain that affects sleep, daily activities, and work/hobbies. After pre-op clearance the patient was taken to the operating room on 06/01/2011 and underwent  Procedure(s): TOTAL HIP ARTHROPLASTY ANTERIOR APPROACH.    Patient was given perioperative antibiotics: Anti-infectives     Start     Dose/Rate Route Frequency Ordered Stop   06/02/11 1200   ceFAZolin (ANCEF) IVPB 1 g/50 mL premix        1 g 100 mL/hr over 30 Minutes Intravenous 4 times per day 06/02/11 0717 06/02/11 1230   06/02/11 0030   ceFAZolin (ANCEF) IVPB 1 g/50 mL premix  Status:  Discontinued        1 g 100 mL/hr over 30 Minutes Intravenous 3 times per day 06/02/11 0024 06/02/11 0717   06/01/11 0722   ceFAZolin (ANCEF) IVPB 2 g/50 mL premix        2 g 100 mL/hr over 30 Minutes Intravenous 60 min pre-op 06/01/11 0722 06/01/11 1021           Patient was given sequential compression devices, early ambulation, and chemoprophylaxis to prevent DVT.  Patient benefited maximally from hospital stay and there were no complications.    Recent vital signs: Patient Vitals for the past 24 hrs:  BP Temp Temp src Pulse Resp SpO2  06/05/11 0645 124/77 mmHg 97.9 F (36.6 C) Oral 67  16  91 %  06/04/11 2200 137/82 mmHg 98.7 F (37.1 C) Oral 75  16  98 %  06/04/11 1400 158/88  mmHg 99.2 F (37.3 C) Oral 78  16  98 %     Recent laboratory studies:  Basename 06/04/11 0403 26-Jun-2011 0436  WBC 4.7 6.1  HGB 9.8* 9.6*  HCT 28.5* 28.4*  PLT 167 163  NA -- --  K -- --  CL -- --  CO2 -- --  BUN -- --  CREATININE -- --  GLUCOSE -- --  INR -- --  CALCIUM -- --     Discharge Medications:   Medication List  As of 06/05/2011  6:50 AM   STOP taking these medications         naproxen sodium 220 MG tablet         TAKE these medications         buPROPion 150 MG 24 hr tablet   Commonly known as: WELLBUTRIN XL   Take 150 mg by mouth 2 (two) times daily.      carvedilol 12.5 MG tablet   Commonly known as: COREG   Take 12.5 mg by mouth 2 (two) times daily with a meal.      cycloSPORINE 0.05 % ophthalmic emulsion   Commonly known as: RESTASIS   Place 1 drop into the right eye daily.      Fish Oil 1000 MG Caps   Take 1 capsule by mouth daily.  furosemide 20 MG tablet   Commonly known as: LASIX   Take 20 mg by mouth daily with breakfast.      lisinopril 30 MG tablet   Commonly known as: PRINIVIL,ZESTRIL   Take 30 mg by mouth daily with breakfast.      magnesium oxide 400 MG tablet   Commonly known as: MAG-OX   Take 400 mg by mouth daily.      methocarbamol 500 MG tablet   Commonly known as: ROBAXIN   Take 1 tablet (500 mg total) by mouth every 6 (six) hours as needed.      mulitivitamin with minerals Tabs   Take 1 tablet by mouth daily.      nebivolol 5 MG tablet   Commonly known as: BYSTOLIC   Take 5 mg by mouth daily with breakfast.      oxyCODONE-acetaminophen 5-325 MG per tablet   Commonly known as: PERCOCET   Take 1-2 tablets by mouth every 4 (four) hours as needed for pain.      Potassium 99 MG Tabs   Take 99 mg by mouth daily.      rivaroxaban 10 MG Tabs tablet   Commonly known as: XARELTO   Take 1 tablet (10 mg total) by mouth daily with breakfast.      travoprost (benzalkonium) 0.004 % ophthalmic solution   Commonly known  as: TRAVATAN   Place 1 drop into the right eye at bedtime.            Diagnostic Studies: Dg Chest 2 View  05/28/2011  *RADIOLOGY REPORT*  Clinical Data: Preop for left knee surgery, former smoking history  CHEST - 2 VIEW  Comparison: Chest x-ray of 04/08/2010  Findings: No active infiltrate or effusion is seen.  Mediastinal contours appear stable.  The heart is within upper limits of normal.  There are degenerative changes throughout the thoracic spine.  IMPRESSION: Stable chest x-ray.  No active lung disease.  Original Report Authenticated By: Juline Patch, M.D.   Dg Hip Complete Left  06/01/2011  *RADIOLOGY REPORT*  Clinical Data: Left hip replacement.  LEFT HIP - COMPLETE 2+ VIEW  Comparison: 02/20/2011 MR.  Findings: Two-view frontal projection reveals left hip replacement in satisfactory position without complication.  IMPRESSION: Left hip replacement without complication noted on this single projection.  Original Report Authenticated By: Fuller Canada, M.D.   Dg Pelvis Portable  06/01/2011  *RADIOLOGY REPORT*  Clinical Data: Left total hip arthroplasty.  PORTABLE PELVIS  Comparison: 08/29/2010.  Findings: Interval left total hip arthroplasty.  Subcutaneous and joint air are noted.  Surgical skin staples are in place.  Femoral stem appears well seated.  Right hip is unremarkable.  IMPRESSION: Left hip arthroplasty with expected postoperative findings.  Original Report Authenticated By: Reyes Ivan, M.D.   Dg Hip Portable 1 View Left  06/01/2011  *RADIOLOGY REPORT*  Clinical Data: Left hip arthroplasty.  PORTABLE LEFT HIP - 1 VIEW  Comparison: 08/29/2010.  Findings: Interval left total hip arthroplasty.  Femoral stem appears well seated.  Subcutaneous air and surgical skin staples are noted.  IMPRESSION: Left total hip arthroplasty with expected postoperative findings.  Original Report Authenticated By: Reyes Ivan, M.D.   Dg C-arm 61-120 Min-no Report  06/01/2011  CLINICAL DATA:  surgery   C-ARM 61-120 MINUTES  Fluoroscopy was utilized by the requesting physician.  No radiographic  interpretation.      Disposition:   To skilled nursing facility  Discharge Orders    Future  Orders Please Complete By Expires   Diet - low sodium heart healthy      Call MD / Call 911      Comments:   If you experience chest pain or shortness of breath, CALL 911 and be transported to the hospital emergency room.  If you develope a fever above 101 F, pus (white drainage) or increased drainage or redness at the wound, or calf pain, call your surgeon's office.   Constipation Prevention      Comments:   Drink plenty of fluids.  Prune juice may be helpful.  You may use a stool softener, such as Colace (over the counter) 100 mg twice a day.  Use MiraLax (over the counter) for constipation as needed.   Increase activity slowly as tolerated      Weight Bearing as taught in Physical Therapy      Comments:   Use a walker or crutches as instructed.   Discharge instructions      Comments:   Increase activities as comfort allows. No hip precautions, weight-bearing as tolerated left hip. You may get your incision wet in the shower daily starting 06/06/11. Dry dressing daily left hip. Follow-up at [facility] in 2 weeks [phone])   Discharge patient            Signed: Kathryne Hitch 06/05/2011, 6:50 AM

## 2011-06-05 NOTE — Plan of Care (Signed)
Problem: Consults Goal: Diagnosis- Total Joint Replacement Outcome: Completed/Met Date Met:  06/05/11 Primary Total Hip  Comments:  No consultation needed. Surgery completed. Teaching performed during care.

## 2011-06-05 NOTE — Progress Notes (Signed)
Clinical Social Work Department CLINICAL SOCIAL WORK PLACEMENT NOTE 06/05/2011  Patient:  [name], [name]  Account Number:  [id number] Admit date:  06/01/2011  Clinical Social Worker:  Cori Razor, LCSW  Date/time:  06/04/2011 01:27 PM  Clinical Social Work is seeking post-discharge placement for this patient at the following level of care:   SKILLED NURSING   (*CSW will update this form in Epic as items are completed)     Patient/family provided with [facility] Department of Clinical Social Work's list of facilities offering this level of care within the geographic area requested by the patient (or if unable, by the patient's family).    Patient/family informed of their freedom to choose among providers that offer the needed level of care, that participate in Medicare, Medicaid or managed care program needed by the patient, have an available bed and are willing to accept the patient.    Patient/family informed of [facility]' ownership interest in [facility], as well as of the fact that they are under no obligation to receive care at this facility.  PASARR submitted to EDS on 06/04/2011 PASARR number received from EDS on 06/04/2011  FL2 transmitted to all facilities in geographic area requested by pt/family on  06/04/2011 FL2 transmitted to all facilities within larger geographic area on   Patient informed that his/her managed care company has contracts with or will negotiate with  certain facilities, including the following:     Patient/family informed of bed offers received:  06/04/2011 Patient chooses bed at [facility] Physician recommends and patient chooses bed at    Patient to be transferred to [facility] on  06/05/2011 Patient to be transferred to facility by [facility]  The following physician request were entered in Epic:   Additional Comments:   Cori Razor LCSW [phone]

## 2011-06-07 DIAGNOSIS — F329 Major depressive disorder, single episode, unspecified: Secondary | ICD-10-CM | POA: Diagnosis not present

## 2011-06-07 DIAGNOSIS — I10 Essential (primary) hypertension: Secondary | ICD-10-CM | POA: Diagnosis not present

## 2011-06-07 DIAGNOSIS — M1610 Unilateral primary osteoarthritis, unspecified hip: Secondary | ICD-10-CM | POA: Diagnosis not present

## 2011-06-07 DIAGNOSIS — D62 Acute posthemorrhagic anemia: Secondary | ICD-10-CM | POA: Diagnosis not present

## 2011-06-18 DIAGNOSIS — M25559 Pain in unspecified hip: Secondary | ICD-10-CM | POA: Diagnosis not present

## 2011-06-18 DIAGNOSIS — M169 Osteoarthritis of hip, unspecified: Secondary | ICD-10-CM | POA: Diagnosis not present

## 2011-06-28 ENCOUNTER — Other Ambulatory Visit: Payer: Self-pay | Admitting: Family Medicine

## 2011-06-28 ENCOUNTER — Ambulatory Visit
Admission: RE | Admit: 2011-06-28 | Discharge: 2011-06-28 | Disposition: A | Discharge status: Home / Self Care | Payer: Medicare Other | Source: Ambulatory Visit | Attending: Family Medicine | Admitting: Family Medicine

## 2011-06-28 DIAGNOSIS — M5030 Other cervical disc degeneration, unspecified cervical region: Secondary | ICD-10-CM | POA: Diagnosis not present

## 2011-06-28 DIAGNOSIS — M47812 Spondylosis without myelopathy or radiculopathy, cervical region: Secondary | ICD-10-CM | POA: Diagnosis not present

## 2011-06-28 DIAGNOSIS — M542 Cervicalgia: Secondary | ICD-10-CM

## 2011-06-28 MED ORDER — IOHEXOL 300 MG/ML  SOLN
1.0000 mL | Freq: Once | INTRAMUSCULAR | Status: AC | PRN
  Administered 2011-06-28: 1 mL via INTRAVENOUS

## 2011-06-28 MED ORDER — TRIAMCINOLONE ACETONIDE 40 MG/ML IJ SUSP (RADIOLOGY)
60.0000 mg | Freq: Once | INTRAMUSCULAR | Status: AC
  Administered 2011-06-28: 60 mg via EPIDURAL

## 2011-06-28 NOTE — Discharge Instructions (Signed)

## 2011-07-19 DIAGNOSIS — I10 Essential (primary) hypertension: Secondary | ICD-10-CM | POA: Diagnosis not present

## 2011-07-19 DIAGNOSIS — N6459 Other signs and symptoms in breast: Secondary | ICD-10-CM | POA: Diagnosis not present

## 2011-07-19 DIAGNOSIS — N390 Urinary tract infection, site not specified: Secondary | ICD-10-CM | POA: Diagnosis not present

## 2011-07-19 DIAGNOSIS — Z79899 Other long term (current) drug therapy: Secondary | ICD-10-CM | POA: Diagnosis not present

## 2011-07-19 DIAGNOSIS — M1990 Unspecified osteoarthritis, unspecified site: Secondary | ICD-10-CM | POA: Diagnosis not present

## 2011-07-23 ENCOUNTER — Other Ambulatory Visit: Payer: Self-pay | Admitting: Internal Medicine

## 2011-07-23 DIAGNOSIS — N63 Unspecified lump in unspecified breast: Secondary | ICD-10-CM

## 2011-07-25 ENCOUNTER — Encounter (HOSPITAL_COMMUNITY): Admission: RE | Payer: Self-pay | Source: Ambulatory Visit

## 2011-07-25 ENCOUNTER — Ambulatory Visit
Admission: RE | Admit: 2011-07-25 | Discharge: 2011-07-25 | Disposition: A | Discharge status: Home / Self Care | Payer: Medicare Other | Source: Ambulatory Visit | Attending: Internal Medicine | Admitting: Internal Medicine

## 2011-07-25 ENCOUNTER — Ambulatory Visit (HOSPITAL_COMMUNITY): Admission: RE | Admit: 2011-07-25 | Payer: Medicare Other | Source: Ambulatory Visit | Admitting: Orthopedic Surgery

## 2011-07-25 DIAGNOSIS — N6489 Other specified disorders of breast: Secondary | ICD-10-CM | POA: Diagnosis not present

## 2011-07-25 DIAGNOSIS — R928 Other abnormal and inconclusive findings on diagnostic imaging of breast: Secondary | ICD-10-CM | POA: Diagnosis not present

## 2011-07-25 DIAGNOSIS — N63 Unspecified lump in unspecified breast: Secondary | ICD-10-CM

## 2011-07-25 SURGERY — ARTHROPLASTY, HIP, TOTAL,POSTERIOR APPROACH
Anesthesia: Choice | Laterality: Left

## 2011-09-04 DIAGNOSIS — R159 Full incontinence of feces: Secondary | ICD-10-CM | POA: Diagnosis not present

## 2011-09-12 ENCOUNTER — Other Ambulatory Visit: Payer: Self-pay | Admitting: Neurosurgery

## 2011-09-12 DIAGNOSIS — M542 Cervicalgia: Secondary | ICD-10-CM | POA: Diagnosis not present

## 2011-09-12 DIAGNOSIS — M4120 Other idiopathic scoliosis, site unspecified: Secondary | ICD-10-CM | POA: Diagnosis not present

## 2011-09-13 ENCOUNTER — Ambulatory Visit
Admission: RE | Admit: 2011-09-13 | Discharge: 2011-09-13 | Disposition: A | Discharge status: Home / Self Care | Payer: Medicare Other | Source: Ambulatory Visit | Attending: Neurosurgery | Admitting: Neurosurgery

## 2011-09-13 DIAGNOSIS — R221 Localized swelling, mass and lump, neck: Secondary | ICD-10-CM | POA: Diagnosis not present

## 2011-09-13 DIAGNOSIS — M542 Cervicalgia: Secondary | ICD-10-CM

## 2011-09-13 DIAGNOSIS — M47812 Spondylosis without myelopathy or radiculopathy, cervical region: Secondary | ICD-10-CM | POA: Diagnosis not present

## 2011-10-03 DIAGNOSIS — M5030 Other cervical disc degeneration, unspecified cervical region: Secondary | ICD-10-CM | POA: Diagnosis not present

## 2011-10-03 DIAGNOSIS — M47812 Spondylosis without myelopathy or radiculopathy, cervical region: Secondary | ICD-10-CM | POA: Diagnosis not present

## 2011-10-30 DIAGNOSIS — M47812 Spondylosis without myelopathy or radiculopathy, cervical region: Secondary | ICD-10-CM | POA: Diagnosis not present

## 2011-11-09 DIAGNOSIS — M542 Cervicalgia: Secondary | ICD-10-CM | POA: Diagnosis not present

## 2011-11-13 DIAGNOSIS — H2510 Age-related nuclear cataract, unspecified eye: Secondary | ICD-10-CM | POA: Diagnosis not present

## 2011-11-13 DIAGNOSIS — H52209 Unspecified astigmatism, unspecified eye: Secondary | ICD-10-CM | POA: Diagnosis not present

## 2011-11-13 DIAGNOSIS — H4011X0 Primary open-angle glaucoma, stage unspecified: Secondary | ICD-10-CM | POA: Diagnosis not present

## 2011-11-13 DIAGNOSIS — H409 Unspecified glaucoma: Secondary | ICD-10-CM | POA: Diagnosis not present

## 2011-11-14 DIAGNOSIS — Z23 Encounter for immunization: Secondary | ICD-10-CM | POA: Diagnosis not present

## 2011-11-21 DIAGNOSIS — I10 Essential (primary) hypertension: Secondary | ICD-10-CM | POA: Diagnosis not present

## 2011-11-21 DIAGNOSIS — M1990 Unspecified osteoarthritis, unspecified site: Secondary | ICD-10-CM | POA: Diagnosis not present

## 2011-12-06 DIAGNOSIS — M1990 Unspecified osteoarthritis, unspecified site: Secondary | ICD-10-CM | POA: Diagnosis not present

## 2011-12-06 DIAGNOSIS — I10 Essential (primary) hypertension: Secondary | ICD-10-CM | POA: Diagnosis not present

## 2011-12-11 DIAGNOSIS — R159 Full incontinence of feces: Secondary | ICD-10-CM | POA: Diagnosis not present

## 2012-01-07 DIAGNOSIS — M47812 Spondylosis without myelopathy or radiculopathy, cervical region: Secondary | ICD-10-CM | POA: Insufficient documentation

## 2012-01-07 DIAGNOSIS — S12100A Unspecified displaced fracture of second cervical vertebra, initial encounter for closed fracture: Secondary | ICD-10-CM | POA: Diagnosis not present

## 2012-01-07 DIAGNOSIS — I10 Essential (primary) hypertension: Secondary | ICD-10-CM | POA: Diagnosis not present

## 2012-01-07 DIAGNOSIS — M5380 Other specified dorsopathies, site unspecified: Secondary | ICD-10-CM | POA: Diagnosis not present

## 2012-01-07 DIAGNOSIS — S199XXA Unspecified injury of neck, initial encounter: Secondary | ICD-10-CM | POA: Diagnosis not present

## 2012-01-07 DIAGNOSIS — R29898 Other symptoms and signs involving the musculoskeletal system: Secondary | ICD-10-CM | POA: Insufficient documentation

## 2012-01-07 DIAGNOSIS — M4310 Spondylolisthesis, site unspecified: Secondary | ICD-10-CM | POA: Diagnosis not present

## 2012-01-07 DIAGNOSIS — S12000A Unspecified displaced fracture of first cervical vertebra, initial encounter for closed fracture: Secondary | ICD-10-CM | POA: Diagnosis not present

## 2012-01-07 DIAGNOSIS — Z9119 Patient's noncompliance with other medical treatment and regimen: Secondary | ICD-10-CM | POA: Diagnosis not present

## 2012-01-07 DIAGNOSIS — Z8739 Personal history of other diseases of the musculoskeletal system and connective tissue: Secondary | ICD-10-CM | POA: Diagnosis not present

## 2012-01-07 DIAGNOSIS — M542 Cervicalgia: Secondary | ICD-10-CM | POA: Diagnosis not present

## 2012-01-09 ENCOUNTER — Emergency Department (HOSPITAL_COMMUNITY)
Admission: EM | Admit: 2012-01-09 | Discharge: 2012-01-09 | Disposition: A | Discharge status: Home / Self Care | Payer: Medicare Other | Attending: Emergency Medicine | Admitting: Emergency Medicine

## 2012-01-09 ENCOUNTER — Encounter (HOSPITAL_COMMUNITY): Payer: Self-pay | Admitting: Family Medicine

## 2012-01-09 ENCOUNTER — Emergency Department (HOSPITAL_COMMUNITY): Payer: Medicare Other

## 2012-01-09 DIAGNOSIS — R110 Nausea: Secondary | ICD-10-CM | POA: Diagnosis not present

## 2012-01-09 DIAGNOSIS — Z87891 Personal history of nicotine dependence: Secondary | ICD-10-CM | POA: Diagnosis not present

## 2012-01-09 DIAGNOSIS — F329 Major depressive disorder, single episode, unspecified: Secondary | ICD-10-CM | POA: Insufficient documentation

## 2012-01-09 DIAGNOSIS — M810 Age-related osteoporosis without current pathological fracture: Secondary | ICD-10-CM | POA: Insufficient documentation

## 2012-01-09 DIAGNOSIS — M542 Cervicalgia: Secondary | ICD-10-CM | POA: Diagnosis not present

## 2012-01-09 DIAGNOSIS — F3289 Other specified depressive episodes: Secondary | ICD-10-CM | POA: Insufficient documentation

## 2012-01-09 DIAGNOSIS — Z79899 Other long term (current) drug therapy: Secondary | ICD-10-CM | POA: Diagnosis not present

## 2012-01-09 DIAGNOSIS — I10 Essential (primary) hypertension: Secondary | ICD-10-CM | POA: Diagnosis not present

## 2012-01-09 DIAGNOSIS — H409 Unspecified glaucoma: Secondary | ICD-10-CM | POA: Insufficient documentation

## 2012-01-09 DIAGNOSIS — M129 Arthropathy, unspecified: Secondary | ICD-10-CM | POA: Insufficient documentation

## 2012-01-09 DIAGNOSIS — S199XXA Unspecified injury of neck, initial encounter: Secondary | ICD-10-CM | POA: Diagnosis not present

## 2012-01-09 MED ORDER — HYDROCODONE-ACETAMINOPHEN 5-325 MG PO TABS
2.0000 | ORAL_TABLET | Freq: Once | ORAL | Status: AC
  Administered 2012-01-09: 2 via ORAL
  Filled 2012-01-09: qty 2

## 2012-01-09 MED ORDER — HYDROCODONE-ACETAMINOPHEN 5-325 MG PO TABS: 2.0000 | ORAL_TABLET | ORAL | Status: DC | PRN

## 2012-01-09 NOTE — ED Notes (Signed)
Per pt dx with neck fracture recently by a pain doctor. sts her regular doctor in Dr. Venetia Maxon and was told she couldn't see him x 1 week. Pt has c-collar.

## 2012-01-09 NOTE — ED Provider Notes (Signed)
History   This chart was scribed for Shannon Canal, MD by Toya Smothers, ED Scribe. The patient was seen in room TR09C/TR09C. Patient's care was started at 1018.  CSN: [id number]  Arrival date & time 01/09/12  1018   First MD Initiated Contact with Patient 01/09/12 1130      Chief Complaint  Patient presents with  . Neck Pain   HPI  [name] is a 76 y.o. female with a h/o HTN and Osteopetrosis, who presents to the Emergency Department complaining of 2 weeks of constant, unchanged, moderate neck pain. Pain is aggravate with ROM, while alleviated by nothing. She was seen 1 week ago by a pain specialist at [facility] and was diagnosed with neck fracture, and prescribed with Hydrocodone for pain treatment and placed on C collar since yesterday. She went to ED at [facility] yesterday but left because she was waiting for too long. She came in today because she thought she needed surgery for the cervical fracture. She has been taking vicodin and that hs been helping moderately. No numbness or weakness in arms. No incontinence. No fever, chills, cough, congestion, rhinorrhea, chest pain, SOB, or n/v/d. Pt is a former smoker, who denies alcohol and illicit drug use.   Past Medical History  Diagnosis Date  . Hypertension   . Arthritis   . Osteoporosis   . Headache   . Depression   . Glaucoma(365)     Past Surgical History  Procedure Date  . Abdominal hysterectomy 2003  . Breast cysts     X2 BENIGN  .  cataracts removed   . Total hip arthroplasty 06/01/2011    Procedure: TOTAL HIP ARTHROPLASTY ANTERIOR APPROACH;  Surgeon: Kathryne Hitch, MD;  Location: [facility];  Service: Orthopedics;  Laterality: Left;  Left Total Hip Replacement, Direct Anterior Approach    History reviewed. No pertinent family history.  History  Substance Use Topics  . Smoking status: Former Smoker    Quit date: 05/27/1980  . Smokeless tobacco: Not on file  . Alcohol Use: No    Review of Systems  HENT:  Positive for neck pain.   Gastrointestinal: Positive for nausea.  All other systems reviewed and are negative.    Allergies  Review of patient's allergies indicates no known allergies.  Home Medications   Current Outpatient Rx  Name  Route  Sig  Dispense  Refill  . BUPROPION HCL ER (XL) 150 MG PO TB24   Oral   Take 150 mg by mouth daily.          [name] CALCIUM CARBONATE-VITAMIN D 500-200 MG-UNIT PO TABS   Oral   Take 1 tablet by mouth 2 (two) times daily.         [name] CARVEDILOL 12.5 MG PO TABS   Oral   Take 12.5 mg by mouth 2 (two) times daily with a meal.         . VITAMIN D 1000 UNITS PO TABS   Oral   Take 1,000 Units by mouth daily.         . COLESTIPOL HCL 1 G PO TABS   Oral   Take 1 g by mouth 2 (two) times daily.         . CYCLOSPORINE 0.05 % OP EMUL   Right Eye   Place 1 drop into the right eye 2 (two) times daily as needed. For dryness         . OMEGA-3 FATTY ACIDS 1000 MG PO CAPS  Oral   Take 1 g by mouth daily.         . FUROSEMIDE 20 MG PO TABS   Oral   Take 20 mg by mouth daily with breakfast.         . HYDROCODONE-ACETAMINOPHEN 5-325 MG PO TABS   Oral   Take 1 tablet by mouth every 6 (six) hours as needed. For pain         . LISINOPRIL 30 MG PO TABS   Oral   Take 30 mg by mouth daily with breakfast.         . MAGNESIUM OXIDE 400 MG PO TABS   Oral   Take 400 mg by mouth daily.         . ADULT MULTIVITAMIN W/MINERALS CH   Oral   Take 1 tablet by mouth daily.         . TRAMADOL HCL 50 MG PO TABS   Oral   Take 50 mg by mouth every 8 (eight) hours as needed. For pain         . TRAVOPROST 0.004 % OP SOLN   Both Eyes   Place 1 drop into both eyes at bedtime.           BP 179/95  Pulse 77  Temp 98.3 F (36.8 C) (Oral)  Resp 20  SpO2 98%  Physical Exam  Constitutional: She is oriented to person, place, and time. She appears well-developed and well-nourished.  HENT:  Head: Normocephalic.  Eyes: Conjunctivae  normal are normal. Pupils are equal, round, and reactive to light.  Neck:       Midline cervical tenderness. C collar in place  Cardiovascular: Normal rate and regular rhythm.   Pulmonary/Chest: Effort normal and breath sounds normal.  Abdominal: Soft. Bowel sounds are normal.  Musculoskeletal: Normal range of motion.       Nl strength and sensation throughout  Neurological: She is alert and oriented to person, place, and time.  Skin: Skin is warm and dry.  Psychiatric: She has a normal mood and affect. Her behavior is normal. Judgment and thought content normal.    ED Course  Procedures DIAGNOSTIC STUDIES: Oxygen Saturation is 98% on room air, normal by my interpretation.    COORDINATION OF CARE: 11:32- Evaluated Pt. Pt is awake, alert, and without distress. 11:39- Ordered CT Cervical Spine Wo Contrast 1 time imaging. 11:40- Consult to neurosurgery Once. 11:57- Rechecked Pt. Discussed current course of treatment after consulting with [facility]. 12:50- Rechecked Pt. Discussed negative findings of radiology report and absence of fracture.  Labs Reviewed - No data to display Ct Cervical Spine Wo Contrast  01/09/2012  *RADIOLOGY REPORT*  Clinical Data: Neck pain, rule out fracture  CT CERVICAL SPINE WITHOUT CONTRAST  Technique:  Multidetector CT imaging of the cervical spine was performed. Multiplanar CT image reconstructions were also generated.  Comparison: 09/13/2011  Findings: Axial images of the cervical spine shows no acute fracture. Degenerative changes C1-C2 articulation are noted. Again noted arthritic changes C1 right lateral mass.  Stable about 3 mm anterolisthesis C3 on C4 vertebral body.  Stable about 3 mm anterolisthesis C4-C5 vertebral body.  Stable disc space flattening with mild posterior spurring and mild anterior spurring at C5-C6 and C6-C7 level.  No prevertebral soft tissue swelling. There is no pneumothorax in visualized lung apices.  IMPRESSION: No acute  fracture or subluxation.  Stable degenerative changes as described above.   Original Report Authenticated By: Natasha Mead, M.D.  No diagnosis found.    MDM  [name] is a 76 y.o. female here with neck pain, possible fracture. CT showed no acute fracture, but extensive arthritic changes. I discussed with Dr. Fredrich Birks nurse, who recommend that she either f/u with Dr. Venetia Maxon or at [place]. She can still wear the c- collar for pain and continue taking pain meds. No need for acute surgery given no fracture and nl neuro exam.    I personally performed the services described in this documentation, which was scribed in my presence. The recorded information has been reviewed and is accurate.     Shannon Canal, MD 01/09/12 [phone]

## 2012-02-07 DIAGNOSIS — I10 Essential (primary) hypertension: Secondary | ICD-10-CM | POA: Diagnosis not present

## 2012-02-18 DIAGNOSIS — M542 Cervicalgia: Secondary | ICD-10-CM | POA: Diagnosis not present

## 2012-02-22 DIAGNOSIS — M47812 Spondylosis without myelopathy or radiculopathy, cervical region: Secondary | ICD-10-CM | POA: Diagnosis not present

## 2012-02-22 DIAGNOSIS — M542 Cervicalgia: Secondary | ICD-10-CM | POA: Diagnosis not present

## 2012-03-24 DIAGNOSIS — H4011X0 Primary open-angle glaucoma, stage unspecified: Secondary | ICD-10-CM | POA: Diagnosis not present

## 2012-03-24 DIAGNOSIS — H409 Unspecified glaucoma: Secondary | ICD-10-CM | POA: Diagnosis not present

## 2012-03-28 DIAGNOSIS — M542 Cervicalgia: Secondary | ICD-10-CM | POA: Diagnosis not present

## 2012-03-28 DIAGNOSIS — M47812 Spondylosis without myelopathy or radiculopathy, cervical region: Secondary | ICD-10-CM | POA: Diagnosis not present

## 2012-03-31 DIAGNOSIS — H409 Unspecified glaucoma: Secondary | ICD-10-CM | POA: Diagnosis not present

## 2012-03-31 DIAGNOSIS — H4011X0 Primary open-angle glaucoma, stage unspecified: Secondary | ICD-10-CM | POA: Diagnosis not present

## 2012-04-09 DIAGNOSIS — M542 Cervicalgia: Secondary | ICD-10-CM | POA: Diagnosis not present

## 2012-04-18 DIAGNOSIS — H4011X0 Primary open-angle glaucoma, stage unspecified: Secondary | ICD-10-CM | POA: Diagnosis not present

## 2012-04-23 DIAGNOSIS — H409 Unspecified glaucoma: Secondary | ICD-10-CM | POA: Diagnosis not present

## 2012-04-23 DIAGNOSIS — H4011X0 Primary open-angle glaucoma, stage unspecified: Secondary | ICD-10-CM | POA: Diagnosis not present

## 2012-04-25 DIAGNOSIS — L821 Other seborrheic keratosis: Secondary | ICD-10-CM | POA: Diagnosis not present

## 2012-04-25 DIAGNOSIS — D692 Other nonthrombocytopenic purpura: Secondary | ICD-10-CM | POA: Diagnosis not present

## 2012-04-25 DIAGNOSIS — D235 Other benign neoplasm of skin of trunk: Secondary | ICD-10-CM | POA: Diagnosis not present

## 2012-04-25 DIAGNOSIS — L570 Actinic keratosis: Secondary | ICD-10-CM | POA: Diagnosis not present

## 2012-04-28 DIAGNOSIS — H409 Unspecified glaucoma: Secondary | ICD-10-CM | POA: Diagnosis not present

## 2012-04-28 DIAGNOSIS — H4011X0 Primary open-angle glaucoma, stage unspecified: Secondary | ICD-10-CM | POA: Diagnosis not present

## 2012-05-08 ENCOUNTER — Ambulatory Visit (INDEPENDENT_AMBULATORY_CARE_PROVIDER_SITE_OTHER): Payer: Medicare Other | Admitting: Internal Medicine

## 2012-05-08 ENCOUNTER — Encounter: Payer: Self-pay | Admitting: Internal Medicine

## 2012-05-08 ENCOUNTER — Other Ambulatory Visit (INDEPENDENT_AMBULATORY_CARE_PROVIDER_SITE_OTHER): Payer: Medicare Other

## 2012-05-08 VITALS — BP 132/72 | HR 60 | Temp 98.3°F | Ht 61.5 in | Wt 183.1 lb

## 2012-05-08 DIAGNOSIS — F329 Major depressive disorder, single episode, unspecified: Secondary | ICD-10-CM | POA: Diagnosis not present

## 2012-05-08 DIAGNOSIS — M810 Age-related osteoporosis without current pathological fracture: Secondary | ICD-10-CM

## 2012-05-08 DIAGNOSIS — F32A Depression, unspecified: Secondary | ICD-10-CM

## 2012-05-08 DIAGNOSIS — M47812 Spondylosis without myelopathy or radiculopathy, cervical region: Secondary | ICD-10-CM

## 2012-05-08 DIAGNOSIS — I10 Essential (primary) hypertension: Secondary | ICD-10-CM | POA: Diagnosis not present

## 2012-05-08 DIAGNOSIS — M8580 Other specified disorders of bone density and structure, unspecified site: Secondary | ICD-10-CM | POA: Insufficient documentation

## 2012-05-08 DIAGNOSIS — Z1239 Encounter for other screening for malignant neoplasm of breast: Secondary | ICD-10-CM

## 2012-05-08 DIAGNOSIS — N3281 Overactive bladder: Secondary | ICD-10-CM | POA: Insufficient documentation

## 2012-05-08 LAB — LIPID PANEL
Cholesterol: 185 mg/dL (ref 0–200)
HDL: 73.5 mg/dL (ref >39.00)
LDL Cholesterol: 101 mg/dL — ABNORMAL HIGH (ref 0–99)
Total CHOL/HDL Ratio: 3
Triglycerides: 54 mg/dL (ref 0.0–149.0)
VLDL: 10.8 mg/dL (ref 0.0–40.0)

## 2012-05-08 LAB — HEPATIC FUNCTION PANEL
ALT: 20 U/L (ref 0–35)
AST: 20 U/L (ref 0–37)
Albumin: 4 g/dL (ref 3.5–5.2)
Alkaline Phosphatase: 123 U/L — ABNORMAL HIGH (ref 39–117)
Bilirubin, Direct: 0.1 mg/dL (ref 0.0–0.3)
Total Bilirubin: 0.6 mg/dL (ref 0.3–1.2)
Total Protein: 7.2 g/dL (ref 6.0–8.3)

## 2012-05-08 LAB — BASIC METABOLIC PANEL
BUN: 24 mg/dL — ABNORMAL HIGH (ref 6–23)
CO2: 25 mEq/L (ref 19–32)
Calcium: 9.5 mg/dL (ref 8.4–10.5)
Chloride: 107 mEq/L (ref 96–112)
Creatinine, Ser: 1.1 mg/dL (ref 0.4–1.2)
GFR: 52 mL/min — ABNORMAL LOW (ref >60.00)
Glucose, Bld: 89 mg/dL (ref 70–99)
Potassium: 4.1 mEq/L (ref 3.5–5.1)
Sodium: 137 mEq/L (ref 135–145)

## 2012-05-08 LAB — CBC WITH DIFFERENTIAL/PLATELET
Basophils Absolute: 0 10*3/uL (ref 0.0–0.1)
Basophils Relative: 0.8 % (ref 0.0–3.0)
Eosinophils Absolute: 0.1 10*3/uL (ref 0.0–0.7)
Eosinophils Relative: 2.3 % (ref 0.0–5.0)
HCT: 40.8 % (ref 36.0–46.0)
Hemoglobin: 14.2 g/dL (ref 12.0–15.0)
Lymphocytes Relative: 20.4 % (ref 12.0–46.0)
Lymphs Abs: 1.3 10*3/uL (ref 0.7–4.0)
MCHC: 34.8 g/dL (ref 30.0–36.0)
MCV: 94.3 fl (ref 78.0–100.0)
Monocytes Absolute: 0.5 10*3/uL (ref 0.1–1.0)
Monocytes Relative: 8 % (ref 3.0–12.0)
Neutro Abs: 4.2 10*3/uL (ref 1.4–7.7)
Neutrophils Relative %: 68.5 % (ref 43.0–77.0)
Platelets: 226 10*3/uL (ref 150.0–400.0)
RBC: 4.33 Mil/uL (ref 3.87–5.11)
RDW: 13.3 % (ref 11.5–14.6)
WBC: 6.1 10*3/uL (ref 4.5–10.5)

## 2012-05-08 LAB — TSH: TSH: 1.51 u[IU]/mL (ref 0.35–5.50)

## 2012-05-08 NOTE — Assessment & Plan Note (Signed)
PMP state - hx cervical vertebral fx (MRI at [facility] 08/2011 report reviewed) Refer for DEXA Continue Ca+D, WB as tolerated

## 2012-05-08 NOTE — Assessment & Plan Note (Signed)
Chronic symptoms - on wellbutrin for years Hx EtOH abuse, dry since 3 week rehab at [facility] in [place] Requests eval and review by psyc - ?[name] - due to increasing symptoms and "excess worries" Will make referral for same Check screening med labs Verified no SI/HI

## 2012-05-08 NOTE — Assessment & Plan Note (Signed)
BP Readings from Last 3 Encounters:  05/08/12 132/72  01/09/12 166/76  06/28/11 123/58   The current medical regimen is effective;  continue present plan and medications.

## 2012-05-08 NOTE — Assessment & Plan Note (Signed)
Chronic neck pain related to same S/p eval and tx at [facility], now working with local neurosurg (stern) ESI x 2 early 2014 ([place]) Uses Aleve TID and tramadol prn The current medical regimen is effective;  continue present plan and medications.
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
## 2012-05-08 NOTE — Patient Instructions (Signed)
It was good to see you today. We have reviewed your prior records including labs and tests today we will send to your prior provider(s) for "release of records" as discussed today -  Test(s) ordered today. Your results will be released to MyChart (or called to you) after review, usually within 72hours after test completion. If any changes need to be made, you will be notified at that same time. Medications reviewed, no changes at this time. we'll make referral to psychiatry for review of depression medications as discussed . Our office will contact you regarding appointment(s) once made. Will also refer to the breast Center for your mammogram screening and bone density testing Please schedule followup in 6 months, call sooner if problems.

## 2012-05-28 ENCOUNTER — Encounter: Payer: Self-pay | Admitting: Internal Medicine

## 2012-06-02 DIAGNOSIS — H4011X0 Primary open-angle glaucoma, stage unspecified: Secondary | ICD-10-CM | POA: Diagnosis not present

## 2012-06-02 DIAGNOSIS — H409 Unspecified glaucoma: Secondary | ICD-10-CM | POA: Diagnosis not present

## 2012-06-06 DIAGNOSIS — L820 Inflamed seborrheic keratosis: Secondary | ICD-10-CM | POA: Diagnosis not present

## 2012-06-06 DIAGNOSIS — L570 Actinic keratosis: Secondary | ICD-10-CM | POA: Diagnosis not present

## 2012-06-24 DIAGNOSIS — M2559 Pain in other specified joint: Secondary | ICD-10-CM | POA: Diagnosis not present

## 2012-06-24 DIAGNOSIS — M542 Cervicalgia: Secondary | ICD-10-CM | POA: Diagnosis not present

## 2012-06-24 DIAGNOSIS — M47812 Spondylosis without myelopathy or radiculopathy, cervical region: Secondary | ICD-10-CM | POA: Diagnosis not present

## 2012-07-08 DIAGNOSIS — M47812 Spondylosis without myelopathy or radiculopathy, cervical region: Secondary | ICD-10-CM | POA: Diagnosis not present

## 2012-07-08 DIAGNOSIS — M479 Spondylosis, unspecified: Secondary | ICD-10-CM | POA: Diagnosis not present

## 2012-07-08 DIAGNOSIS — M2550 Pain in unspecified joint: Secondary | ICD-10-CM | POA: Diagnosis not present

## 2012-07-08 DIAGNOSIS — Z006 Encounter for examination for normal comparison and control in clinical research program: Secondary | ICD-10-CM | POA: Diagnosis not present

## 2012-07-08 DIAGNOSIS — M542 Cervicalgia: Secondary | ICD-10-CM | POA: Diagnosis not present

## 2012-07-16 DIAGNOSIS — H409 Unspecified glaucoma: Secondary | ICD-10-CM | POA: Diagnosis not present

## 2012-07-16 DIAGNOSIS — H524 Presbyopia: Secondary | ICD-10-CM | POA: Diagnosis not present

## 2012-07-16 DIAGNOSIS — H2510 Age-related nuclear cataract, unspecified eye: Secondary | ICD-10-CM | POA: Diagnosis not present

## 2012-07-25 ENCOUNTER — Other Ambulatory Visit: Payer: Self-pay | Admitting: Internal Medicine

## 2012-08-13 ENCOUNTER — Other Ambulatory Visit: Payer: PRIVATE HEALTH INSURANCE

## 2012-08-13 ENCOUNTER — Ambulatory Visit: Payer: PRIVATE HEALTH INSURANCE

## 2012-08-19 ENCOUNTER — Other Ambulatory Visit: Payer: Self-pay | Admitting: Internal Medicine

## 2012-09-14 ENCOUNTER — Other Ambulatory Visit: Payer: Self-pay | Admitting: Internal Medicine

## 2012-09-15 DIAGNOSIS — H409 Unspecified glaucoma: Secondary | ICD-10-CM | POA: Diagnosis not present

## 2012-09-15 DIAGNOSIS — H4011X0 Primary open-angle glaucoma, stage unspecified: Secondary | ICD-10-CM | POA: Diagnosis not present

## 2012-09-15 NOTE — Telephone Encounter (Signed)
Faxed script back to [facility]...lmb 

## 2012-09-22 ENCOUNTER — Other Ambulatory Visit: Payer: Self-pay | Admitting: *Deleted

## 2012-09-22 MED ORDER — BUPROPION HCL ER (SR) 150 MG PO TB12: ORAL_TABLET | ORAL | Status: DC

## 2012-09-22 NOTE — Telephone Encounter (Signed)
Received fax stating pt is requesting 90 day supply on her wellbutrin...Raechel Chute

## 2012-09-23 ENCOUNTER — Ambulatory Visit
Admission: RE | Admit: 2012-09-23 | Discharge: 2012-09-23 | Disposition: A | Discharge status: Home / Self Care | Payer: Medicare Other | Source: Ambulatory Visit | Attending: Internal Medicine | Admitting: Internal Medicine

## 2012-09-23 DIAGNOSIS — Z1239 Encounter for other screening for malignant neoplasm of breast: Secondary | ICD-10-CM

## 2012-09-23 DIAGNOSIS — M810 Age-related osteoporosis without current pathological fracture: Secondary | ICD-10-CM

## 2012-09-23 DIAGNOSIS — M899 Disorder of bone, unspecified: Secondary | ICD-10-CM | POA: Diagnosis not present

## 2012-09-23 DIAGNOSIS — Z1231 Encounter for screening mammogram for malignant neoplasm of breast: Secondary | ICD-10-CM | POA: Diagnosis not present

## 2012-09-24 ENCOUNTER — Encounter: Payer: Self-pay | Admitting: Internal Medicine

## 2012-09-30 ENCOUNTER — Telehealth: Payer: Self-pay | Admitting: *Deleted

## 2012-09-30 DIAGNOSIS — K921 Melena: Secondary | ICD-10-CM

## 2012-09-30 NOTE — Telephone Encounter (Signed)
Pt needs OV - here or GI - to further eval this problem Ok to pick up cards, but does not exclude need for OV I will make GI referral if preferred - let me know

## 2012-09-30 NOTE — Telephone Encounter (Signed)
Pt called states she has been experiencing bloody stools; requesting a fecal check.  She is wanting to pickup the occult cards and mail them back.  Pt does not want to come in for appointment.  Please advise.

## 2012-09-30 NOTE — Telephone Encounter (Signed)
Spoke with pt she states she has appointment with Dr Randa Evens.

## 2012-10-01 DIAGNOSIS — H259 Unspecified age-related cataract: Secondary | ICD-10-CM | POA: Diagnosis not present

## 2012-10-01 DIAGNOSIS — H409 Unspecified glaucoma: Secondary | ICD-10-CM | POA: Diagnosis not present

## 2012-10-01 DIAGNOSIS — H47239 Glaucomatous optic atrophy, unspecified eye: Secondary | ICD-10-CM | POA: Diagnosis not present

## 2012-10-01 DIAGNOSIS — H4011X0 Primary open-angle glaucoma, stage unspecified: Secondary | ICD-10-CM | POA: Diagnosis not present

## 2012-10-02 DIAGNOSIS — H4011X0 Primary open-angle glaucoma, stage unspecified: Secondary | ICD-10-CM | POA: Diagnosis not present

## 2012-10-02 DIAGNOSIS — H409 Unspecified glaucoma: Secondary | ICD-10-CM | POA: Diagnosis not present

## 2012-10-03 DIAGNOSIS — H401123 Primary open-angle glaucoma, left eye, severe stage: Secondary | ICD-10-CM | POA: Insufficient documentation

## 2012-10-04 ENCOUNTER — Other Ambulatory Visit: Payer: Self-pay | Admitting: Internal Medicine

## 2012-10-09 DIAGNOSIS — M542 Cervicalgia: Secondary | ICD-10-CM | POA: Diagnosis not present

## 2012-10-09 DIAGNOSIS — M479 Spondylosis, unspecified: Secondary | ICD-10-CM | POA: Diagnosis not present

## 2012-10-09 DIAGNOSIS — D692 Other nonthrombocytopenic purpura: Secondary | ICD-10-CM | POA: Diagnosis not present

## 2012-10-09 DIAGNOSIS — L570 Actinic keratosis: Secondary | ICD-10-CM | POA: Diagnosis not present

## 2012-10-09 DIAGNOSIS — M2559 Pain in other specified joint: Secondary | ICD-10-CM | POA: Diagnosis not present

## 2012-10-09 DIAGNOSIS — M47812 Spondylosis without myelopathy or radiculopathy, cervical region: Secondary | ICD-10-CM | POA: Diagnosis not present

## 2012-10-10 ENCOUNTER — Other Ambulatory Visit: Payer: Self-pay | Admitting: Internal Medicine

## 2012-10-10 NOTE — Telephone Encounter (Signed)
Faxed script back to [facility]...lmb 

## 2012-11-03 DIAGNOSIS — H409 Unspecified glaucoma: Secondary | ICD-10-CM | POA: Diagnosis not present

## 2012-11-03 DIAGNOSIS — H4011X0 Primary open-angle glaucoma, stage unspecified: Secondary | ICD-10-CM | POA: Diagnosis not present

## 2012-11-06 ENCOUNTER — Ambulatory Visit: Payer: PRIVATE HEALTH INSURANCE | Admitting: Internal Medicine

## 2012-11-11 ENCOUNTER — Ambulatory Visit (INDEPENDENT_AMBULATORY_CARE_PROVIDER_SITE_OTHER): Payer: Medicare Other | Admitting: Internal Medicine

## 2012-11-11 ENCOUNTER — Encounter: Payer: Self-pay | Admitting: Internal Medicine

## 2012-11-11 VITALS — BP 132/80 | HR 61 | Temp 98.8°F | Wt 187.4 lb

## 2012-11-11 DIAGNOSIS — R197 Diarrhea, unspecified: Secondary | ICD-10-CM | POA: Diagnosis not present

## 2012-11-11 DIAGNOSIS — Z23 Encounter for immunization: Secondary | ICD-10-CM | POA: Diagnosis not present

## 2012-11-11 DIAGNOSIS — K529 Noninfective gastroenteritis and colitis, unspecified: Secondary | ICD-10-CM

## 2012-11-11 DIAGNOSIS — M47812 Spondylosis without myelopathy or radiculopathy, cervical region: Secondary | ICD-10-CM | POA: Diagnosis not present

## 2012-11-11 DIAGNOSIS — I10 Essential (primary) hypertension: Secondary | ICD-10-CM

## 2012-11-11 DIAGNOSIS — E669 Obesity, unspecified: Secondary | ICD-10-CM

## 2012-11-11 MED ORDER — COLESTIPOL HCL 1 G PO TABS: 2.0000 g | ORAL_TABLET | Freq: Two times a day (BID) | ORAL | Status: DC

## 2012-11-11 NOTE — Progress Notes (Signed)
Pre-visit discussion using our clinic review tool. No additional management support is needed unless otherwise documented below in the visit note.  

## 2012-11-11 NOTE — Assessment & Plan Note (Signed)
Chronic neck pain related to same S/p eval and tx at [facility], now working with local neurosurg (stern) ESI x 2 early 2014 ([place]) - considering follow up for repeat vs treat with chiropractor Uses Aleve TID and tramadol prn
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
## 2012-11-11 NOTE — Patient Instructions (Signed)
It was good to see you today.  Your annual flu shot was given and/or updated today.  Medications reviewed, no changes at this time.  we'll make referral to nutrition for review of diet as discussed . Our office will contact you regarding appointment(s) once made.  Please schedule followup in 6 months for annual visit/labs, call sooner if problems.

## 2012-11-11 NOTE — Assessment & Plan Note (Signed)
Wt Readings from Last 3 Encounters:  11/11/12 187 lb 6.4 oz (85.004 kg)  05/08/12 183 lb 1.9 oz (83.063 kg)  06/01/11 176 lb (79.833 kg)   The patient is asked to make an attempt to improve diet and exercise patterns to aid in medical management of this problem. Refer to nutrition

## 2012-11-11 NOTE — Assessment & Plan Note (Signed)
BP Readings from Last 3 Encounters:  11/11/12 132/80  05/08/12 132/72  01/09/12 166/76   The current medical regimen is effective;  continue present plan and medications.

## 2012-11-11 NOTE — Assessment & Plan Note (Signed)
Works with GI [name] - for same Uses colestipol BID The current medical regimen is effective;  continue present plan and medications.

## 2012-11-14 ENCOUNTER — Other Ambulatory Visit: Payer: Self-pay | Admitting: *Deleted

## 2012-11-14 MED ORDER — CARVEDILOL 12.5 MG PO TABS: 12.5000 mg | ORAL_TABLET | Freq: Two times a day (BID) | ORAL | Status: DC

## 2012-11-19 DIAGNOSIS — M5030 Other cervical disc degeneration, unspecified cervical region: Secondary | ICD-10-CM | POA: Diagnosis not present

## 2012-11-19 DIAGNOSIS — M5137 Other intervertebral disc degeneration, lumbosacral region: Secondary | ICD-10-CM | POA: Diagnosis not present

## 2012-11-19 DIAGNOSIS — M9981 Other biomechanical lesions of cervical region: Secondary | ICD-10-CM | POA: Diagnosis not present

## 2012-11-19 DIAGNOSIS — IMO0002 Reserved for concepts with insufficient information to code with codable children: Secondary | ICD-10-CM | POA: Diagnosis not present

## 2012-11-19 DIAGNOSIS — M5020 Other cervical disc displacement, unspecified cervical region: Secondary | ICD-10-CM | POA: Diagnosis not present

## 2012-11-19 DIAGNOSIS — M999 Biomechanical lesion, unspecified: Secondary | ICD-10-CM | POA: Diagnosis not present

## 2012-11-20 DIAGNOSIS — H4011X0 Primary open-angle glaucoma, stage unspecified: Secondary | ICD-10-CM | POA: Diagnosis not present

## 2012-11-20 DIAGNOSIS — H409 Unspecified glaucoma: Secondary | ICD-10-CM | POA: Diagnosis not present

## 2012-11-20 DIAGNOSIS — T1500XA Foreign body in cornea, unspecified eye, initial encounter: Secondary | ICD-10-CM | POA: Diagnosis not present

## 2012-11-20 DIAGNOSIS — H52209 Unspecified astigmatism, unspecified eye: Secondary | ICD-10-CM | POA: Diagnosis not present

## 2012-11-22 DIAGNOSIS — H2510 Age-related nuclear cataract, unspecified eye: Secondary | ICD-10-CM | POA: Diagnosis not present

## 2012-11-22 DIAGNOSIS — H409 Unspecified glaucoma: Secondary | ICD-10-CM | POA: Diagnosis not present

## 2012-11-22 DIAGNOSIS — H4011X0 Primary open-angle glaucoma, stage unspecified: Secondary | ICD-10-CM | POA: Diagnosis not present

## 2012-11-24 DIAGNOSIS — M9981 Other biomechanical lesions of cervical region: Secondary | ICD-10-CM | POA: Diagnosis not present

## 2012-11-24 DIAGNOSIS — M5020 Other cervical disc displacement, unspecified cervical region: Secondary | ICD-10-CM | POA: Diagnosis not present

## 2012-11-24 DIAGNOSIS — M5137 Other intervertebral disc degeneration, lumbosacral region: Secondary | ICD-10-CM | POA: Diagnosis not present

## 2012-11-24 DIAGNOSIS — M5030 Other cervical disc degeneration, unspecified cervical region: Secondary | ICD-10-CM | POA: Diagnosis not present

## 2012-11-24 DIAGNOSIS — IMO0002 Reserved for concepts with insufficient information to code with codable children: Secondary | ICD-10-CM | POA: Diagnosis not present

## 2012-11-24 DIAGNOSIS — M999 Biomechanical lesion, unspecified: Secondary | ICD-10-CM | POA: Diagnosis not present

## 2012-11-26 DIAGNOSIS — M5030 Other cervical disc degeneration, unspecified cervical region: Secondary | ICD-10-CM | POA: Diagnosis not present

## 2012-11-26 DIAGNOSIS — M9981 Other biomechanical lesions of cervical region: Secondary | ICD-10-CM | POA: Diagnosis not present

## 2012-11-26 DIAGNOSIS — M999 Biomechanical lesion, unspecified: Secondary | ICD-10-CM | POA: Diagnosis not present

## 2012-11-26 DIAGNOSIS — IMO0002 Reserved for concepts with insufficient information to code with codable children: Secondary | ICD-10-CM | POA: Diagnosis not present

## 2012-11-26 DIAGNOSIS — M5020 Other cervical disc displacement, unspecified cervical region: Secondary | ICD-10-CM | POA: Diagnosis not present

## 2012-11-26 DIAGNOSIS — M5137 Other intervertebral disc degeneration, lumbosacral region: Secondary | ICD-10-CM | POA: Diagnosis not present

## 2012-11-27 DIAGNOSIS — H4011X0 Primary open-angle glaucoma, stage unspecified: Secondary | ICD-10-CM | POA: Diagnosis not present

## 2012-11-27 DIAGNOSIS — I455 Other specified heart block: Secondary | ICD-10-CM | POA: Diagnosis not present

## 2012-11-27 DIAGNOSIS — H409 Unspecified glaucoma: Secondary | ICD-10-CM | POA: Diagnosis not present

## 2012-11-27 DIAGNOSIS — Z01811 Encounter for preprocedural respiratory examination: Secondary | ICD-10-CM | POA: Diagnosis not present

## 2012-11-27 DIAGNOSIS — R0602 Shortness of breath: Secondary | ICD-10-CM | POA: Diagnosis not present

## 2012-12-01 DIAGNOSIS — IMO0002 Reserved for concepts with insufficient information to code with codable children: Secondary | ICD-10-CM | POA: Diagnosis not present

## 2012-12-01 DIAGNOSIS — M5020 Other cervical disc displacement, unspecified cervical region: Secondary | ICD-10-CM | POA: Diagnosis not present

## 2012-12-01 DIAGNOSIS — M999 Biomechanical lesion, unspecified: Secondary | ICD-10-CM | POA: Diagnosis not present

## 2012-12-01 DIAGNOSIS — M5030 Other cervical disc degeneration, unspecified cervical region: Secondary | ICD-10-CM | POA: Diagnosis not present

## 2012-12-01 DIAGNOSIS — M9981 Other biomechanical lesions of cervical region: Secondary | ICD-10-CM | POA: Diagnosis not present

## 2012-12-01 DIAGNOSIS — M5137 Other intervertebral disc degeneration, lumbosacral region: Secondary | ICD-10-CM | POA: Diagnosis not present

## 2012-12-02 DIAGNOSIS — Z87891 Personal history of nicotine dependence: Secondary | ICD-10-CM | POA: Diagnosis not present

## 2012-12-02 DIAGNOSIS — Z90710 Acquired absence of both cervix and uterus: Secondary | ICD-10-CM | POA: Diagnosis not present

## 2012-12-02 DIAGNOSIS — H409 Unspecified glaucoma: Secondary | ICD-10-CM | POA: Diagnosis not present

## 2012-12-02 DIAGNOSIS — Z9849 Cataract extraction status, unspecified eye: Secondary | ICD-10-CM | POA: Diagnosis not present

## 2012-12-02 DIAGNOSIS — H4010X0 Unspecified open-angle glaucoma, stage unspecified: Secondary | ICD-10-CM | POA: Diagnosis not present

## 2012-12-02 DIAGNOSIS — Z961 Presence of intraocular lens: Secondary | ICD-10-CM | POA: Diagnosis not present

## 2012-12-02 DIAGNOSIS — M47812 Spondylosis without myelopathy or radiculopathy, cervical region: Secondary | ICD-10-CM | POA: Diagnosis not present

## 2012-12-02 DIAGNOSIS — Z9889 Other specified postprocedural states: Secondary | ICD-10-CM | POA: Diagnosis not present

## 2012-12-02 DIAGNOSIS — H4011X0 Primary open-angle glaucoma, stage unspecified: Secondary | ICD-10-CM | POA: Diagnosis not present

## 2012-12-02 DIAGNOSIS — I10 Essential (primary) hypertension: Secondary | ICD-10-CM | POA: Diagnosis not present

## 2012-12-03 DIAGNOSIS — H409 Unspecified glaucoma: Secondary | ICD-10-CM | POA: Diagnosis not present

## 2012-12-03 DIAGNOSIS — H4011X0 Primary open-angle glaucoma, stage unspecified: Secondary | ICD-10-CM | POA: Diagnosis not present

## 2012-12-03 DIAGNOSIS — Z9889 Other specified postprocedural states: Secondary | ICD-10-CM | POA: Diagnosis not present

## 2012-12-11 DIAGNOSIS — M5020 Other cervical disc displacement, unspecified cervical region: Secondary | ICD-10-CM | POA: Diagnosis not present

## 2012-12-11 DIAGNOSIS — IMO0002 Reserved for concepts with insufficient information to code with codable children: Secondary | ICD-10-CM | POA: Diagnosis not present

## 2012-12-11 DIAGNOSIS — M5030 Other cervical disc degeneration, unspecified cervical region: Secondary | ICD-10-CM | POA: Diagnosis not present

## 2012-12-11 DIAGNOSIS — M9981 Other biomechanical lesions of cervical region: Secondary | ICD-10-CM | POA: Diagnosis not present

## 2012-12-11 DIAGNOSIS — M999 Biomechanical lesion, unspecified: Secondary | ICD-10-CM | POA: Diagnosis not present

## 2012-12-11 DIAGNOSIS — M5137 Other intervertebral disc degeneration, lumbosacral region: Secondary | ICD-10-CM | POA: Diagnosis not present

## 2012-12-23 DIAGNOSIS — M5030 Other cervical disc degeneration, unspecified cervical region: Secondary | ICD-10-CM | POA: Diagnosis not present

## 2012-12-23 DIAGNOSIS — IMO0002 Reserved for concepts with insufficient information to code with codable children: Secondary | ICD-10-CM | POA: Diagnosis not present

## 2012-12-23 DIAGNOSIS — M9981 Other biomechanical lesions of cervical region: Secondary | ICD-10-CM | POA: Diagnosis not present

## 2012-12-23 DIAGNOSIS — M5020 Other cervical disc displacement, unspecified cervical region: Secondary | ICD-10-CM | POA: Diagnosis not present

## 2012-12-23 DIAGNOSIS — M999 Biomechanical lesion, unspecified: Secondary | ICD-10-CM | POA: Diagnosis not present

## 2012-12-23 DIAGNOSIS — M5137 Other intervertebral disc degeneration, lumbosacral region: Secondary | ICD-10-CM | POA: Diagnosis not present

## 2012-12-31 DIAGNOSIS — M999 Biomechanical lesion, unspecified: Secondary | ICD-10-CM | POA: Diagnosis not present

## 2012-12-31 DIAGNOSIS — M5020 Other cervical disc displacement, unspecified cervical region: Secondary | ICD-10-CM | POA: Diagnosis not present

## 2012-12-31 DIAGNOSIS — IMO0002 Reserved for concepts with insufficient information to code with codable children: Secondary | ICD-10-CM | POA: Diagnosis not present

## 2012-12-31 DIAGNOSIS — M9981 Other biomechanical lesions of cervical region: Secondary | ICD-10-CM | POA: Diagnosis not present

## 2012-12-31 DIAGNOSIS — M5137 Other intervertebral disc degeneration, lumbosacral region: Secondary | ICD-10-CM | POA: Diagnosis not present

## 2012-12-31 DIAGNOSIS — M5030 Other cervical disc degeneration, unspecified cervical region: Secondary | ICD-10-CM | POA: Diagnosis not present

## 2013-01-20 ENCOUNTER — Other Ambulatory Visit: Payer: Self-pay | Admitting: Internal Medicine

## 2013-01-21 NOTE — Telephone Encounter (Signed)
Faxed script back to [facility]...lmb 

## 2013-01-26 ENCOUNTER — Telehealth: Payer: Self-pay

## 2013-01-26 NOTE — Telephone Encounter (Signed)
The patient called and is hoping to discuss her referral with the CMA  Callback - [phone]

## 2013-01-27 NOTE — Telephone Encounter (Signed)
Called pt back she stated that md place a referral for a nutritionist medicare does not cover the program is $318. Pt states she was told if she had a health problem medicare would cover. Requesting md response/alternative...Raechel Chute

## 2013-01-27 NOTE — Telephone Encounter (Signed)
Notified pt with md response.../lmb 

## 2013-01-27 NOTE — Telephone Encounter (Signed)
i listed related health problem of HTN (401.9) as reason weight loss and nutrition consult needed -  I am uncertain what else i can do

## 2013-03-04 ENCOUNTER — Other Ambulatory Visit: Payer: Self-pay | Admitting: Internal Medicine

## 2013-03-04 ENCOUNTER — Telehealth: Payer: Self-pay

## 2013-03-04 NOTE — Telephone Encounter (Signed)
The patient called and is hoping she can get a 90 day supply of her blood pressure meds called in. She states the pharmacy is faxing over an order, but it is in a 30 day dose.

## 2013-03-05 ENCOUNTER — Telehealth: Payer: Self-pay | Admitting: *Deleted

## 2013-03-05 MED ORDER — LISINOPRIL 30 MG PO TABS: ORAL_TABLET | ORAL | Status: DC

## 2013-03-05 MED ORDER — FUROSEMIDE 40 MG PO TABS: ORAL_TABLET | ORAL | Status: DC

## 2013-03-05 NOTE — Telephone Encounter (Signed)
Called pt back to clarify which BP med she is needing. Pt is not sure. She states she have both lisinopril & coreg. Inform her pharmacy did send a request for her lasix & we sent refill back on that. Pt states she wanting a 90 supply. inform her will resend for 90 day...Johny Chess

## 2013-03-05 NOTE — Telephone Encounter (Signed)
Patient phoned requesting refill for lisinopril.  Refilled per protocol.  Notified patient.

## 2013-03-09 DIAGNOSIS — H40059 Ocular hypertension, unspecified eye: Secondary | ICD-10-CM | POA: Diagnosis not present

## 2013-03-09 DIAGNOSIS — H04129 Dry eye syndrome of unspecified lacrimal gland: Secondary | ICD-10-CM | POA: Diagnosis not present

## 2013-03-09 DIAGNOSIS — H47239 Glaucomatous optic atrophy, unspecified eye: Secondary | ICD-10-CM | POA: Diagnosis not present

## 2013-03-09 DIAGNOSIS — H259 Unspecified age-related cataract: Secondary | ICD-10-CM | POA: Diagnosis not present

## 2013-03-12 ENCOUNTER — Telehealth: Payer: Self-pay | Admitting: *Deleted

## 2013-03-12 DIAGNOSIS — R159 Full incontinence of feces: Secondary | ICD-10-CM | POA: Diagnosis not present

## 2013-03-12 DIAGNOSIS — Z86010 Personal history of colonic polyps: Secondary | ICD-10-CM | POA: Diagnosis not present

## 2013-03-12 NOTE — Telephone Encounter (Signed)
Patient phoned stating she was returning Lucy's phone call.  CB# [phone]

## 2013-03-12 NOTE — Telephone Encounter (Signed)
Called pt back she wanted to know the other name of psychiatrist md suggested. One was Dr. Buddy Duty which she no longer accept medicare. Do you remember the other name? Not in pt ontes from 04/2012...[name]

## 2013-03-13 NOTE — Telephone Encounter (Signed)
[name] does geriatric psyc

## 2013-03-13 NOTE — Telephone Encounter (Signed)
Notified pt with md response.../lmb 

## 2013-03-16 DIAGNOSIS — H4011X0 Primary open-angle glaucoma, stage unspecified: Secondary | ICD-10-CM | POA: Diagnosis not present

## 2013-03-16 DIAGNOSIS — H409 Unspecified glaucoma: Secondary | ICD-10-CM | POA: Diagnosis not present

## 2013-03-31 ENCOUNTER — Other Ambulatory Visit: Payer: Self-pay | Admitting: Internal Medicine

## 2013-03-31 DIAGNOSIS — H5710 Ocular pain, unspecified eye: Secondary | ICD-10-CM | POA: Diagnosis not present

## 2013-03-31 DIAGNOSIS — T1500XA Foreign body in cornea, unspecified eye, initial encounter: Secondary | ICD-10-CM | POA: Diagnosis not present

## 2013-04-09 DIAGNOSIS — Z09 Encounter for follow-up examination after completed treatment for conditions other than malignant neoplasm: Secondary | ICD-10-CM | POA: Diagnosis not present

## 2013-04-09 DIAGNOSIS — Z86010 Personal history of colonic polyps: Secondary | ICD-10-CM | POA: Diagnosis not present

## 2013-04-09 LAB — HM COLONOSCOPY

## 2013-04-14 DIAGNOSIS — H2510 Age-related nuclear cataract, unspecified eye: Secondary | ICD-10-CM | POA: Diagnosis not present

## 2013-04-14 DIAGNOSIS — I10 Essential (primary) hypertension: Secondary | ICD-10-CM | POA: Diagnosis not present

## 2013-04-14 DIAGNOSIS — Z87891 Personal history of nicotine dependence: Secondary | ICD-10-CM | POA: Diagnosis not present

## 2013-04-14 DIAGNOSIS — H409 Unspecified glaucoma: Secondary | ICD-10-CM | POA: Diagnosis not present

## 2013-04-14 DIAGNOSIS — M47812 Spondylosis without myelopathy or radiculopathy, cervical region: Secondary | ICD-10-CM | POA: Diagnosis not present

## 2013-04-14 DIAGNOSIS — H4011X0 Primary open-angle glaucoma, stage unspecified: Secondary | ICD-10-CM | POA: Diagnosis not present

## 2013-04-14 DIAGNOSIS — M5380 Other specified dorsopathies, site unspecified: Secondary | ICD-10-CM | POA: Diagnosis not present

## 2013-04-14 DIAGNOSIS — M129 Arthropathy, unspecified: Secondary | ICD-10-CM | POA: Diagnosis not present

## 2013-04-14 DIAGNOSIS — Q762 Congenital spondylolisthesis: Secondary | ICD-10-CM | POA: Diagnosis not present

## 2013-04-15 DIAGNOSIS — H2510 Age-related nuclear cataract, unspecified eye: Secondary | ICD-10-CM | POA: Diagnosis not present

## 2013-04-15 DIAGNOSIS — H409 Unspecified glaucoma: Secondary | ICD-10-CM | POA: Diagnosis not present

## 2013-04-15 DIAGNOSIS — H4011X0 Primary open-angle glaucoma, stage unspecified: Secondary | ICD-10-CM | POA: Diagnosis not present

## 2013-04-17 ENCOUNTER — Encounter: Payer: Self-pay | Admitting: Internal Medicine

## 2013-04-30 ENCOUNTER — Ambulatory Visit (INDEPENDENT_AMBULATORY_CARE_PROVIDER_SITE_OTHER): Payer: Medicare Other | Admitting: Family Medicine

## 2013-04-30 ENCOUNTER — Encounter: Payer: Self-pay | Admitting: Family Medicine

## 2013-04-30 ENCOUNTER — Ambulatory Visit: Payer: Medicare Other

## 2013-04-30 VITALS — BP 112/68 | HR 60 | Temp 98.7°F | Resp 16 | Ht 61.5 in | Wt 191.0 lb

## 2013-04-30 DIAGNOSIS — IMO0002 Reserved for concepts with insufficient information to code with codable children: Secondary | ICD-10-CM

## 2013-04-30 DIAGNOSIS — Y92009 Unspecified place in unspecified non-institutional (private) residence as the place of occurrence of the external cause: Secondary | ICD-10-CM

## 2013-04-30 DIAGNOSIS — M25579 Pain in unspecified ankle and joints of unspecified foot: Secondary | ICD-10-CM

## 2013-04-30 DIAGNOSIS — S8002XA Contusion of left knee, initial encounter: Secondary | ICD-10-CM

## 2013-04-30 DIAGNOSIS — S90412A Abrasion, left great toe, initial encounter: Secondary | ICD-10-CM

## 2013-04-30 DIAGNOSIS — L02419 Cutaneous abscess of limb, unspecified: Secondary | ICD-10-CM | POA: Diagnosis not present

## 2013-04-30 DIAGNOSIS — W19XXXA Unspecified fall, initial encounter: Secondary | ICD-10-CM

## 2013-04-30 DIAGNOSIS — L03119 Cellulitis of unspecified part of limb: Secondary | ICD-10-CM | POA: Diagnosis not present

## 2013-04-30 DIAGNOSIS — S8000XA Contusion of unspecified knee, initial encounter: Secondary | ICD-10-CM

## 2013-04-30 MED ORDER — CEPHALEXIN 500 MG PO CAPS: 500.0000 mg | ORAL_CAPSULE | Freq: Four times a day (QID) | ORAL | Status: DC

## 2013-04-30 NOTE — Patient Instructions (Addendum)
Take the antibiotic one pill 3 times daily  Keep the wound on the foot clean. Dressing with a little Polysporin ointment.  Return if not much better over the next 5 days. Return sooner if problems  Tylenol or tramadol for pain.

## 2013-04-30 NOTE — Progress Notes (Signed)
Subjective: Patient slipped in her driveway on wet [place] about 5 or 6 days ago. She hurt her left leg. She continues to hurt so she came in today. She hurts a lot in her left knee. She has an abrasion just below her left knee. She hurts down her leg. She has a sore left large toe been red and draining. She's been using some triple m ointment on it.  Objective: She is able to walk adequately. The left knee has tenderness laterally on the proximal fibula area. Her left leg has an abrasion  on the tibial tuberosity area. This is healing up nicely. The left large toe has an ulceration on it where she caused the abrasion. She has some surrounding cellulitis. It is tender to touch.  Assessment: Abrasion left knee Ulceration and cellulitis lymph no Pain left knee secondary to contusion Pain left foot  Plan: X-ray foot and knee Culture the abscess  [facility] reading (PRIMARY) by  Dr. Linna Darner Normal left knee Bunion left large toe mtp joint No fractures  Treat ulceration of toe.  Painful knee leg and foot should resolve with time.[name]

## 2013-05-03 LAB — WOUND CULTURE
Gram Stain: NONE SEEN
Gram Stain: NONE SEEN
Gram Stain: NONE SEEN
Organism ID, Bacteria: NO GROWTH

## 2013-05-04 ENCOUNTER — Encounter: Payer: Self-pay | Admitting: Internal Medicine

## 2013-05-07 ENCOUNTER — Encounter: Payer: Self-pay | Admitting: Internal Medicine

## 2013-05-07 ENCOUNTER — Ambulatory Visit (INDEPENDENT_AMBULATORY_CARE_PROVIDER_SITE_OTHER): Payer: Medicare Other | Admitting: Internal Medicine

## 2013-05-07 ENCOUNTER — Telehealth: Payer: Self-pay | Admitting: Internal Medicine

## 2013-05-07 VITALS — BP 110/62 | HR 59 | Temp 98.5°F | Wt 190.1 lb

## 2013-05-07 DIAGNOSIS — E669 Obesity, unspecified: Secondary | ICD-10-CM

## 2013-05-07 DIAGNOSIS — I10 Essential (primary) hypertension: Secondary | ICD-10-CM | POA: Diagnosis not present

## 2013-05-07 DIAGNOSIS — M47812 Spondylosis without myelopathy or radiculopathy, cervical region: Secondary | ICD-10-CM | POA: Diagnosis not present

## 2013-05-07 DIAGNOSIS — G4710 Hypersomnia, unspecified: Secondary | ICD-10-CM

## 2013-05-07 NOTE — Assessment & Plan Note (Signed)
Wt Readings from Last 3 Encounters:  05/07/13 190 lb 1.9 oz (86.238 kg)  04/30/13 191 lb (86.637 kg)  11/11/12 187 lb 6.4 oz (85.004 kg)   The patient is asked to make an attempt to improve diet and exercise patterns to aid in medical management of this problem.

## 2013-05-07 NOTE — Progress Notes (Signed)
Subjective:    Patient ID: [name], female    DOB: 1935-06-24, 78 y.o.   MRN: [id number]  HPI  Patient is here for follow up  Reviewed chronic medical issues and interval medical events  Past Medical History  Diagnosis Date  . Hypertension   . Arthritis   . Osteoporosis   . Headache(784.0)   . Depression   . Glaucoma   . Degenerative arthritis of hip     s/p THR 05/2011  . OAB (overactive bladder)     Review of Systems  Constitutional: Negative for activity change, appetite change and unexpected weight change.  Respiratory: Negative for cough and shortness of breath.   Cardiovascular: Negative for chest pain and leg swelling.  Psychiatric/Behavioral: Positive for sleep disturbance (hypersomina). Negative for behavioral problems, dysphoric mood and decreased concentration.       Objective:   Physical Exam  BP 110/62  Pulse 59  Temp(Src) 98.5 F (36.9 C) (Oral)  Wt 190 lb 1.9 oz (86.238 kg)  SpO2 96% Wt Readings from Last 3 Encounters:  05/07/13 190 lb 1.9 oz (86.238 kg)  04/30/13 191 lb (86.637 kg)  11/11/12 187 lb 6.4 oz (85.004 kg)   Constitutional: She is overweight, but appears well-developed and well-nourished. No distress.  Neck: Normal range of motion. Neck supple. No JVD present. No thyromegaly present.  Cardiovascular: Normal rate, regular rhythm and normal heart sounds.  No murmur heard. No BLE edema. Pulmonary/Chest: Effort normal and breath sounds normal. No respiratory distress. She has no wheezes.  MSkel: bunion deformity L foot with abrasion over 1MTP (injury 2 weeks ago with UC eval one week ago reviewed). B hands with 2/3 MCP enlargement Skin: varicose veins changes BLE Psychiatric: She has a normal mood and affect. Her behavior is normal. Judgment and thought content normal.   Lab Results  Component Value Date   WBC 6.1 05/08/2012   HGB 14.2 05/08/2012   HCT 40.8 05/08/2012   PLT 226.0 05/08/2012   GLUCOSE 89 05/08/2012   CHOL 185  05/08/2012   TRIG 54.0 05/08/2012   HDL 73.50 05/08/2012   LDLCALC 101* 05/08/2012   ALT 20 05/08/2012   AST 20 05/08/2012   NA 137 05/08/2012   K 4.1 05/08/2012   CL 107 05/08/2012   CREATININE 1.1 05/08/2012   BUN 24* 05/08/2012   CO2 25 05/08/2012   TSH 1.51 05/08/2012   INR 1.05 05/28/2011    Dg Bone Density  09/23/2012   *RADIOLOGY REPORT*  Clinical Data: 78 year old post menopausal Caucasian female with prior history of left total hip arthroplasty in 2013, currently undergoing calcium and vitamin D supplementation, remote prior history of hormone replacement therapy discontinued 40+ years ago. Followup osteopenia identified on prior examinations.  DUAL X-RAY ABSORPTIOMETRY (DXA) FOR BONE MINERAL DENSITY  RIGHT FEMUR NECK  Bone Mineral Density (BMD):             0.665 g/cm2 Young Adult T Score:                           -1.7 Z Score:                                                 0.4  LEFT FOREARM (1/3 RADIUS)  Bone Mineral Density (BMD):  0.578 g/cm2 Young Adult T Score:                                  -1.9 Z Score:                            0.7  ASSESSMENT:  Patient's diagnostic category is LOW BONE MASS (OSTEOPENIA) by WHO Criteria.  FRACTURE RISK: INCREASED.  FRAX: Based on the [place], the 10 year probability of a major osteoporotic fracture is 14%.  The 10 year probability of a hip fracture is 3.2%.    This was the initial examination of the right hip, as the patient has undergone interval left hip arthroplasty since the prior examination.  The lumbar spine had been excluded on the prior examination due to diffuse degenerative changes.  Since the baseline examination in 2011, there has been a statistically significant 5.7% decrease in bone mineral density in the left forearm.  Comparison: 03/08/2009.  RECOMMENDATIONS:  All patients should ensure an adequate intake of dietary calcium (1200mg  daily) and vitamin D (800 IU daily) unless contraindicated.  The National Osteoporosis Foundation recommends that FDA-approved medical therapies be considered in postmenopausal women and mean age 34 or older with a:  1)    Hip or vertebral (clinical or morphometric) fracture.  2)   T-score of -2.5 or lower at the spine or hip.  3)   Ten-year fracture probability by FRAX of 3% or greater for hip fracture or 20% or greater for major osteoporotic fracture.  FOLLOW-UP:  People with diagnosed cases of osteoporosis or at high risk for fracture should have regular bone mineral density tests.  For patients eligible for Medicare, routine testing is allowed once every 2 years.  The testing frequency can be increased to one year for patients who have rapidly progressing disease, those who are receiving or discontinuing medical therapy to restore bone mass, or have additional risk factors.   Original Report Authenticated By: Evangeline Dakin, M.D.   Mm Digital Screening  09/24/2012   *RADIOLOGY REPORT*  Clinical Data: Screening.  DIGITAL SCREENING BILATERAL MAMMOGRAM WITH CAD DIGITAL BREAST TOMOSYNTHESIS  Digital breast tomosynthesis images are acquired in two projections.  These images are reviewed in combination with the digital mammogram, confirming the findings below.  Comparison:  02/12/2005  FINDINGS:  ACR Breast Density Category b: There are scattered areas of fibroglandular density.  There is no suspicious dominant mass, architectural distortion, or calcification to suggest malignancy.  Images were processed with CAD.  IMPRESSION: No mammographic evidence of malignancy.  A result letter of this screening mammogram will be mailed directly to the patient.  RECOMMENDATION: Screening mammogram in one year. (Code:SM-B-01Y)  BI-RADS CATEGORY 1:  Negative.   Original Report Authenticated By: Ulyess Blossom, M.D.       Assessment & Plan:   Hypersomnia - no clear meds to attribute side effects of same - reports not rested at night and falls asleep anytime sitting still ?OSA or  other sleep disorder - refer to sleep for further eval and tx  Problem List Items Addressed This Visit   Cervical spondylosis     Chronic neck pain related to same S/p eval and tx at [facility], now working with local neurosurg (stern) ESI x 2 early 2014 ([facility]) - unimproved S/p treatment late 2014 with chiropractor - [place] improved with "laser"  Uses Aleve TID and tramadol prn    Hypertension      BP Readings from Last 3 Encounters:  05/07/13 110/62  04/30/13 112/68  11/11/12 132/80   The current medical regimen is effective;  continue present plan and medications.    Relevant Orders      Basic metabolic panel      Lipid panel   Obesity (BMI 30-39.9)      Wt Readings from Last 3 Encounters:  05/07/13 190 lb 1.9 oz (86.238 kg)  04/30/13 191 lb (86.637 kg)  11/11/12 187 lb 6.4 oz (85.004 kg)   The patient is asked to make an attempt to improve diet and exercise patterns to aid in medical management of this problem.    Relevant Orders      TSH      Lipid panel    Other Visit Diagnoses   Hypersomnia    -  Primary    Relevant Orders       Ambulatory referral to Pulmonology

## 2013-05-07 NOTE — Assessment & Plan Note (Signed)
Chronic neck pain related to same S/p eval and tx at [facility], now working with local neurosurg (stern) ESI x 2 early 2014 ([facility]) - unimproved S/p treatment late 2014 with chiropractor - [place] improved with "laser" Uses Aleve TID and tramadol prn

## 2013-05-07 NOTE — Telephone Encounter (Signed)
Pt called to say she does not want to do a sleep study at this time.

## 2013-05-07 NOTE — Patient Instructions (Signed)
It was good to see you today.  We have reviewed your prior records including labs and tests today  Test(s) ordered today. Return when you are fasting. Your results will be released to [place] (or called to you) after review, usually within 72hours after test completion. If any changes need to be made, you will be notified at that same time.  Medications reviewed and updated, no changes recommended at this time.  we'll make referral for sleep evaluation. Our office will contact you regarding appointment(s) once made.  Work on lifestyle changes as discussed (low fat, low carb, increased protein diet; improved exercise efforts; weight loss) to control sugar, blood pressure and cholesterol levels and/or reduce risk of developing other medical problems. Look into [email or web address].[email or web address] or other type of food journal to assist you in this process.  Please schedule followup in 6 months for annual exam, call sooner if problems.

## 2013-05-07 NOTE — Progress Notes (Signed)
Pre visit review using our clinic review tool, if applicable. No additional management support is needed unless otherwise documented below in the visit note. 

## 2013-05-07 NOTE — Assessment & Plan Note (Signed)
BP Readings from Last 3 Encounters:  05/07/13 110/62  04/30/13 112/68  11/11/12 132/80   The current medical regimen is effective;  continue present plan and medications.

## 2013-05-08 ENCOUNTER — Telehealth: Payer: Self-pay | Admitting: Internal Medicine

## 2013-05-08 NOTE — Telephone Encounter (Signed)
Noted - ok to cancel study at this time thanks

## 2013-05-08 NOTE — Telephone Encounter (Signed)
Relevant patient education assigned to patient using Emmi. ° °

## 2013-05-09 ENCOUNTER — Other Ambulatory Visit: Payer: Self-pay | Admitting: Internal Medicine

## 2013-05-27 ENCOUNTER — Institutional Professional Consult (permissible substitution): Payer: Medicare Other | Admitting: Pulmonary Disease

## 2013-06-09 ENCOUNTER — Other Ambulatory Visit: Payer: Self-pay | Admitting: Internal Medicine

## 2013-06-17 DIAGNOSIS — IMO0002 Reserved for concepts with insufficient information to code with codable children: Secondary | ICD-10-CM | POA: Diagnosis not present

## 2013-06-17 DIAGNOSIS — M5030 Other cervical disc degeneration, unspecified cervical region: Secondary | ICD-10-CM | POA: Diagnosis not present

## 2013-06-17 DIAGNOSIS — M5020 Other cervical disc displacement, unspecified cervical region: Secondary | ICD-10-CM | POA: Diagnosis not present

## 2013-06-17 DIAGNOSIS — M9981 Other biomechanical lesions of cervical region: Secondary | ICD-10-CM | POA: Diagnosis not present

## 2013-06-17 DIAGNOSIS — M5137 Other intervertebral disc degeneration, lumbosacral region: Secondary | ICD-10-CM | POA: Diagnosis not present

## 2013-06-17 DIAGNOSIS — M999 Biomechanical lesion, unspecified: Secondary | ICD-10-CM | POA: Diagnosis not present

## 2013-06-18 DIAGNOSIS — IMO0002 Reserved for concepts with insufficient information to code with codable children: Secondary | ICD-10-CM | POA: Diagnosis not present

## 2013-06-18 DIAGNOSIS — M999 Biomechanical lesion, unspecified: Secondary | ICD-10-CM | POA: Diagnosis not present

## 2013-06-18 DIAGNOSIS — M5137 Other intervertebral disc degeneration, lumbosacral region: Secondary | ICD-10-CM | POA: Diagnosis not present

## 2013-06-18 DIAGNOSIS — M5030 Other cervical disc degeneration, unspecified cervical region: Secondary | ICD-10-CM | POA: Diagnosis not present

## 2013-06-18 DIAGNOSIS — M5020 Other cervical disc displacement, unspecified cervical region: Secondary | ICD-10-CM | POA: Diagnosis not present

## 2013-06-18 DIAGNOSIS — M9981 Other biomechanical lesions of cervical region: Secondary | ICD-10-CM | POA: Diagnosis not present

## 2013-06-25 DIAGNOSIS — M999 Biomechanical lesion, unspecified: Secondary | ICD-10-CM | POA: Diagnosis not present

## 2013-06-25 DIAGNOSIS — M5030 Other cervical disc degeneration, unspecified cervical region: Secondary | ICD-10-CM | POA: Diagnosis not present

## 2013-06-25 DIAGNOSIS — M9981 Other biomechanical lesions of cervical region: Secondary | ICD-10-CM | POA: Diagnosis not present

## 2013-06-25 DIAGNOSIS — M5137 Other intervertebral disc degeneration, lumbosacral region: Secondary | ICD-10-CM | POA: Diagnosis not present

## 2013-06-25 DIAGNOSIS — M5020 Other cervical disc displacement, unspecified cervical region: Secondary | ICD-10-CM | POA: Diagnosis not present

## 2013-06-25 DIAGNOSIS — IMO0002 Reserved for concepts with insufficient information to code with codable children: Secondary | ICD-10-CM | POA: Diagnosis not present

## 2013-07-03 DIAGNOSIS — M5020 Other cervical disc displacement, unspecified cervical region: Secondary | ICD-10-CM | POA: Diagnosis not present

## 2013-07-03 DIAGNOSIS — M5030 Other cervical disc degeneration, unspecified cervical region: Secondary | ICD-10-CM | POA: Diagnosis not present

## 2013-07-03 DIAGNOSIS — IMO0002 Reserved for concepts with insufficient information to code with codable children: Secondary | ICD-10-CM | POA: Diagnosis not present

## 2013-07-03 DIAGNOSIS — M5137 Other intervertebral disc degeneration, lumbosacral region: Secondary | ICD-10-CM | POA: Diagnosis not present

## 2013-07-03 DIAGNOSIS — M999 Biomechanical lesion, unspecified: Secondary | ICD-10-CM | POA: Diagnosis not present

## 2013-07-03 DIAGNOSIS — M9981 Other biomechanical lesions of cervical region: Secondary | ICD-10-CM | POA: Diagnosis not present

## 2013-07-10 ENCOUNTER — Other Ambulatory Visit (INDEPENDENT_AMBULATORY_CARE_PROVIDER_SITE_OTHER): Payer: Medicare Other

## 2013-07-10 DIAGNOSIS — E669 Obesity, unspecified: Secondary | ICD-10-CM | POA: Diagnosis not present

## 2013-07-10 DIAGNOSIS — I10 Essential (primary) hypertension: Secondary | ICD-10-CM

## 2013-07-10 LAB — BASIC METABOLIC PANEL
BUN: 17 mg/dL (ref 6–23)
CO2: 27 mEq/L (ref 19–32)
Calcium: 9.4 mg/dL (ref 8.4–10.5)
Chloride: 102 mEq/L (ref 96–112)
Creatinine, Ser: 1 mg/dL (ref 0.4–1.2)
GFR: 60.75 mL/min (ref >60.00)
Glucose, Bld: 86 mg/dL (ref 70–99)
Potassium: 4.1 mEq/L (ref 3.5–5.1)
Sodium: 138 mEq/L (ref 135–145)

## 2013-07-10 LAB — LIPID PANEL
Cholesterol: 163 mg/dL (ref 0–200)
HDL: 56.7 mg/dL (ref >39.00)
LDL Cholesterol: 89 mg/dL (ref 0–99)
NonHDL: 106.3
Total CHOL/HDL Ratio: 3
Triglycerides: 87 mg/dL (ref 0.0–149.0)
VLDL: 17.4 mg/dL (ref 0.0–40.0)

## 2013-07-10 LAB — TSH: TSH: 1.12 u[IU]/mL (ref 0.35–4.50)

## 2013-07-19 ENCOUNTER — Ambulatory Visit (INDEPENDENT_AMBULATORY_CARE_PROVIDER_SITE_OTHER): Payer: Medicare Other | Admitting: Emergency Medicine

## 2013-07-19 ENCOUNTER — Ambulatory Visit (INDEPENDENT_AMBULATORY_CARE_PROVIDER_SITE_OTHER): Payer: Medicare Other

## 2013-07-19 VITALS — BP 126/78 | HR 83 | Temp 98.7°F | Resp 17 | Ht 62.5 in | Wt 195.0 lb

## 2013-07-19 DIAGNOSIS — M25559 Pain in unspecified hip: Secondary | ICD-10-CM | POA: Diagnosis not present

## 2013-07-19 DIAGNOSIS — M25569 Pain in unspecified knee: Secondary | ICD-10-CM

## 2013-07-19 DIAGNOSIS — I447 Left bundle-branch block, unspecified: Secondary | ICD-10-CM | POA: Diagnosis not present

## 2013-07-19 DIAGNOSIS — R609 Edema, unspecified: Secondary | ICD-10-CM | POA: Diagnosis not present

## 2013-07-19 DIAGNOSIS — M25561 Pain in right knee: Secondary | ICD-10-CM

## 2013-07-19 DIAGNOSIS — M25551 Pain in right hip: Secondary | ICD-10-CM

## 2013-07-19 DIAGNOSIS — R600 Localized edema: Secondary | ICD-10-CM

## 2013-07-19 MED ORDER — TRAMADOL HCL 50 MG PO TABS: 50.0000 mg | ORAL_TABLET | Freq: Three times a day (TID) | ORAL | Status: DC | PRN

## 2013-07-19 NOTE — Progress Notes (Addendum)
Subjective:  This chart was scribed for Arlyss Queen, MD by Mercy Moore, Medial Scribe. This patient was seen in room 3 and the patient's care was started at 12:02 PM.    Patient ID: [name], female    DOB: 13-Dec-1935, 78 y.o.   MRN: [id number]  HPI HPI Comments: [name] is a 78 y.o. female who presents to the [facility] with a right and hip and knee injury. Patient reports falling down three carpeted stairs 07/03/13. She reports turning to place an object she was carrying on a stair, falling on her right hip and hitting her head on the wall. Patient denies immediate hip and knee pain, but states it did not present until days later. Patient suspects that she twisted her knee during the fall. Pain is exacerbated with movement. Patient is ambulatory.   Patient reports distant history of tobacco use.   Patient Active Problem List   Diagnosis Date Noted  . Obesity (BMI 30-39.9) 11/11/2012  . Chronic diarrhea   . Hypertension   . OAB (overactive bladder)   . Depression   . Osteopenia   . Symptoms referable to back 01/07/2012  . Cervical spondylosis 01/07/2012  . Degenerative arthritis of hip 06/01/2011  . Cataract 11/17/2010  . Glaucoma 11/17/2010   Past Medical History  Diagnosis Date  . Hypertension   . Arthritis   . Osteoporosis   . Headache(784.0)   . Depression   . Glaucoma   . Degenerative arthritis of hip     s/p THR 05/2011  . OAB (overactive bladder)    Past Surgical History  Procedure Laterality Date  . Abdominal hysterectomy  2003  . Breast cysts      X2 BENIGN  .  cataracts removed  2013 L  . Total hip arthroplasty  06/01/2011    Procedure: TOTAL HIP ARTHROPLASTY ANTERIOR APPROACH;  Surgeon: Mcarthur Rossetti, MD;  Location: [facility];  Service: Orthopedics;  Laterality: Left;  Left Total Hip Replacement, Direct Anterior Approach  . Breast surgery    . Breast biopsy  1960  . Glaucoma valve insertion Bilateral 2013, 2014    bond  (WS)   No Known Allergies Prior to Admission medications   Medication Sig Start Date End Date Taking? Authorizing Provider  acetaZOLAMIDE (DIAMOX) 250 MG tablet Take 1 three times a week 04/23/12  Yes Historical Provider, MD  buPROPion (WELLBUTRIN SR) 150 MG 12 hr tablet take 1 tablet by mouth twice a day 03/31/13  Yes Rowe Clack, MD  calcium-vitamin D (OSCAL WITH D) 500-200 MG-UNIT per tablet Take 1 tablet by mouth 2 (two) times daily.   Yes Historical Provider, MD  carvedilol (COREG) 12.5 MG tablet take 1 tablet by mouth twice a day 06/09/13  Yes Rowe Clack, MD  cephALEXin (KEFLEX) 500 MG capsule Take 1 capsule (500 mg total) by mouth 4 (four) times daily. 04/30/13  Yes Posey Boyer, MD  cholecalciferol (VITAMIN D) 1000 UNITS tablet Take 1,000 Units by mouth daily.   Yes Historical Provider, MD  colestipol (COLESTID) 1 G tablet Take 2 tablets (2 g total) by mouth 2 (two) times daily. 11/11/12  Yes Rowe Clack, MD  fish oil-omega-3 fatty acids 1000 MG capsule Take 1 g by mouth daily.   Yes Historical Provider, MD  furosemide (LASIX) 40 MG tablet take 1 tablet by mouth once daily 03/05/13  Yes Rowe Clack, MD  lisinopril (PRINIVIL,ZESTRIL) 30 MG tablet take 1 tablet by  mouth once daily 03/05/13  Yes Rowe Clack, MD  Multiple Vitamin (MULITIVITAMIN WITH MINERALS) TABS Take 1 tablet by mouth daily.   Yes Historical Provider, MD   History   Social History  . Marital Status: Divorced    Spouse Name: N/A    Number of Children: N/A  . Years of Education: N/A   Occupational History  . Not on file.   Social History Main Topics  . Smoking status: Former Smoker    Quit date: 05/27/1980  . Smokeless tobacco: Not on file  . Alcohol Use: No  . Drug Use: No  . Sexual Activity: Not on file   Other Topics Concern  . Not on file   Social History Narrative  . No narrative on file      Review of Systems  Constitutional: Negative for fever and chills.    Respiratory: Positive for shortness of breath.   Cardiovascular: Positive for leg swelling. Negative for chest pain.  Musculoskeletal: Positive for arthralgias and joint swelling. Negative for back pain.  Neurological: Negative for weakness, numbness and headaches.  Psychiatric/Behavioral: Negative for confusion.   Patient reports shortness of breath with walking and climbing flights of stairs. Patient endorses that she is able to complete 50-100 feet without difficulty. Patient denies associated chest pain.     Objective:   Physical Exam  Nursing note and vitals reviewed.   CONSTITUTIONAL: Well developed/well nourished HEAD: Normocephalic/atraumatic EYES: EOMI/PERRL ENMT: Mucous membranes moist NECK: supple no meningeal signs SPINE:entire spine nontender CV: S1/S2 noted, no murmurs/rubs/gallops noted; 2/6 systolic murmur left sternal border LUNGS: Lungs are clear to auscultation bilaterally, no apparent distress ABDOMEN: soft, nontender, no rebound or guarding GU: no cva tenderness NEURO: Pt is awake/alert, moves all extremitiesx4 EXTREMITIES: pulses normal, full ROM; lower extremity edema with varicose veins; degenerative changes of her right knee, no bruising, no swelling; patient is able to ambulate without difficulty regarding her right hip SKIN: warm, color normal PSYCH: no abnormalities of mood noted  Filed Vitals:   07/19/13 1055  BP: 126/78  Pulse: 83  Temp: 98.7 F (37.1 C)  Resp: 17   EKG left bundle branch [facility] reading (PRIMARY) by  Dr. Everlene Farrier is no fracture of the right hip. She is status post left hip replacement. There are degenerative changes of the right knee with very narrow joint space. There is significant vascular calcification noted     Assessment & Plan:    The patient suffered a contusion to her hip and knee. She was given some Ultram to have for that pain which she can take with Tylenol. She has a left bundle branch block with extreme shortness of  breath, dyspnea on exertion, and leg edema and referral made to Dr. Mare Ferrari cardiology for his evaluation.

## 2013-07-22 ENCOUNTER — Telehealth: Payer: Self-pay

## 2013-07-22 NOTE — Telephone Encounter (Signed)
Patient called and states that she has a missed call from [place] today but does not know who called her. Please return call and advise. Thank you.

## 2013-07-22 NOTE — Telephone Encounter (Signed)
After reviewing chart, LMVM that I did not see where anyone had tried to call patient.

## 2013-07-27 ENCOUNTER — Other Ambulatory Visit: Payer: Self-pay | Admitting: Internal Medicine

## 2013-07-27 DIAGNOSIS — M25559 Pain in unspecified hip: Secondary | ICD-10-CM | POA: Diagnosis not present

## 2013-07-29 NOTE — Telephone Encounter (Signed)
Faxed script back to [facility]...lmb 

## 2013-08-05 ENCOUNTER — Ambulatory Visit (INDEPENDENT_AMBULATORY_CARE_PROVIDER_SITE_OTHER): Payer: Medicare Other | Admitting: Cardiology

## 2013-08-05 ENCOUNTER — Encounter: Payer: Self-pay | Admitting: Cardiology

## 2013-08-05 VITALS — BP 149/82 | HR 78 | Ht 62.25 in | Wt 195.0 lb

## 2013-08-05 DIAGNOSIS — R0600 Dyspnea, unspecified: Secondary | ICD-10-CM

## 2013-08-05 DIAGNOSIS — R609 Edema, unspecified: Secondary | ICD-10-CM

## 2013-08-05 DIAGNOSIS — R011 Cardiac murmur, unspecified: Secondary | ICD-10-CM | POA: Diagnosis not present

## 2013-08-05 DIAGNOSIS — R0609 Other forms of dyspnea: Secondary | ICD-10-CM

## 2013-08-05 DIAGNOSIS — I447 Left bundle-branch block, unspecified: Secondary | ICD-10-CM | POA: Diagnosis not present

## 2013-08-05 DIAGNOSIS — R0989 Other specified symptoms and signs involving the circulatory and respiratory systems: Secondary | ICD-10-CM

## 2013-08-05 NOTE — Patient Instructions (Signed)
Your physician recommends that you continue on your current medications as directed. Please refer to the Current Medication list given to you today.  Your physician recommends that you schedule a follow-up appointment in: 4-6 week ov/ekg with  Dr. Mare Ferrari or NP/PA  Your physician has requested that you have an echocardiogram. Echocardiography is a painless test that uses sound waves to create images of your heart. It provides your doctor with information about the size and shape of your heart and how well your heart's chambers and valves are working. This procedure takes approximately one hour. There are no restrictions for this procedure.  Your physician has requested that you have a lexiscan myoview. For further information please visit [email or web address]. Please follow instruction sheet, as given.

## 2013-08-05 NOTE — Progress Notes (Addendum)
[name] Date of Birth:  1935-09-27 [place] [address] [place], [place]  [address] [phone]        Fax   [phone]   History of Present Illness: This pleasant 78 year old woman is seen at the request of Dr. Nena Jordan.  She was [facility] seen at [facility] complaining of shortness of breath and an electrocardiogram done on 07/19/13 showed a new left bundle branch block.  Patient was in her usual state of health until 07/03/2013 when she fell suffering an injury to her right knee and right hip.  That was the initial reason that she went to the urgent care Center.  However she was also concerned about increasing shortness of breath.  A previous EKG in 2013 showed normal sinus rhythm and a narrow QRS.  Patient has been experiencing peripheral edema and weight gain.  She has been sleeping on 3 pillows.  She denies any chest pain but has been experiencing shortness of breath. Her family history reveals that her father had a heart attack in his 29s but lived until 89.  He died of COPD.  The patient's mother died of cancer. The patient has a past history of hypercholesterolemia and essential hypertension.  Current Outpatient Prescriptions  Medication Sig Dispense Refill  . buPROPion (WELLBUTRIN SR) 150 MG 12 hr tablet take 1 tablet by mouth twice a day  180 tablet  2  . calcium-vitamin D (OSCAL WITH D) 500-200 MG-UNIT per tablet Take 1 tablet by mouth 2 (two) times daily.      . carvedilol (COREG) 12.5 MG tablet take 1 tablet by mouth twice a day  180 tablet  1  . cephALEXin (KEFLEX) 500 MG capsule Take 1 capsule (500 mg total) by mouth 4 (four) times daily.  21 capsule  0  . cholecalciferol (VITAMIN D) 1000 UNITS tablet Take 1,000 Units by mouth daily.      . colestipol (COLESTID) 1 G tablet Take 2 tablets (2 g total) by mouth 2 (two) times daily.      . fish oil-omega-3 fatty acids 1000 MG capsule Take 1 g by mouth daily.      . furosemide  (LASIX) 40 MG tablet take 1 tablet by mouth once daily  90 tablet  1  . lisinopril (PRINIVIL,ZESTRIL) 30 MG tablet take 1 tablet by mouth once daily  90 tablet  1  . Multiple Vitamin (MULITIVITAMIN WITH MINERALS) TABS Take 1 tablet by mouth daily.      . traMADol (ULTRAM) 50 MG tablet take 1 tablet by mouth twice a day  60 tablet  5   No current facility-administered medications for this visit.    No Known Allergies  Patient Active Problem List   Diagnosis Date Noted  . LBBB (left bundle branch block) 07/19/2013  . Obesity (BMI 30-39.9) 11/11/2012  . Chronic diarrhea   . Hypertension   . OAB (overactive bladder)   . Depression   . Osteopenia   . Symptoms referable to back 01/07/2012  . Cervical spondylosis 01/07/2012  . Degenerative arthritis of hip 06/01/2011  . Cataract 11/17/2010  . Glaucoma 11/17/2010    History  Smoking status  . Former Smoker  . Quit date: 05/27/1980  Smokeless tobacco  . Not on file    History  Alcohol Use No    Family History  Problem Relation Age of Onset  . Ovarian cancer Mother   . Alcohol abuse Father   . Stroke  Brother     Review of Systems: Constitutional: no fever chills diaphoresis or fatigue or change in weight.  Head and neck: no hearing loss, no epistaxis, no photophobia or visual disturbance. Respiratory: No cough, shortness of breath or wheezing. Cardiovascular: No chest pain peripheral edema, palpitations. Gastrointestinal: No abdominal distention, no abdominal pain, no change in bowel habits hematochezia or melena. Genitourinary: No dysuria, no frequency, no urgency, no nocturia. Musculoskeletal:No arthralgias, no back pain, no gait disturbance or myalgias. Neurological: No dizziness, no headaches, no numbness, no seizures, no syncope, no weakness, no tremors. Hematologic: No lymphadenopathy, no easy bruising. Psychiatric: No confusion, no hallucinations, no sleep disturbance.    Physical Exam: Filed Vitals:    08/05/13 1053  BP: 149/82  Pulse: 78   the general appearance reveals a well-developed well-nourished elderly woman in no distress.The head and neck exam reveals pupils equal and reactive.  Extraocular movements are full.  There is no scleral icterus.  The mouth and pharynx are normal.  The neck is supple.  The carotids reveal no bruits.  The jugular venous pressure is normal.  The  thyroid is not enlarged.  There is no lymphadenopathy.  The chest is clear to percussion and auscultation.  There are no rales or rhonchi.  Expansion of the chest is symmetrical.  The precordium is quiet.  The first heart sound is normal.  The second heart sound is physiologically split.  There is a grade 1/6 systolic ejection murmur at the base.  No diastolic murmur.  There is no abnormal lift or heave.  The abdomen is soft and nontender.  The bowel sounds are normal.  The liver and spleen are not enlarged.  There are no abdominal masses.  There are no abdominal bruits.  Extremities reveal good pedal pulses.  There is 1+ ankle edema  There is no cyanosis or clubbing.  Strength is normal and symmetrical in all extremities.  There is no lateralizing weakness.  There are no sensory deficits.  The skin is warm and dry.  There is no rash.  She has a scrape on her right knee from a recent fall.  Her EKG from 07/19/13 was reviewed.  It shows normal sinus rhythm and left bundle branch block   Assessment / Plan: 1. left bundle branch block, new since 2013. 2. exertional dyspnea 3. essential hypertension 4. Hypercholesterolemia 5. family history of premature coronary artery disease 6. exogenous obesity 7. osteoarthritis, status post left total hip arthroplasty in may 2013  Disposition: We will update her chest x-ray We will have her return for a two-dimensional echocardiogram We will have her return for a lexi scan Myoview Many thanks for the opportunity to see this pleasant woman with you.  We will be in touch regarding the  results of the above studies.  No new medications were prescribed today.

## 2013-08-11 ENCOUNTER — Telehealth: Payer: Self-pay | Admitting: *Deleted

## 2013-08-11 NOTE — Telephone Encounter (Signed)
Spoke with patient regarding getting Chest Xray, stated she would go today or tomorrow. Patient is scheduled for echo and myoview later this month.

## 2013-08-17 ENCOUNTER — Ambulatory Visit
Admission: RE | Admit: 2013-08-17 | Discharge: 2013-08-17 | Disposition: A | Discharge status: Home / Self Care | Payer: Medicare Other | Source: Ambulatory Visit | Attending: Cardiology | Admitting: Cardiology

## 2013-08-17 DIAGNOSIS — R0602 Shortness of breath: Secondary | ICD-10-CM | POA: Diagnosis not present

## 2013-08-17 DIAGNOSIS — R0600 Dyspnea, unspecified: Secondary | ICD-10-CM

## 2013-08-17 DIAGNOSIS — R0609 Other forms of dyspnea: Principal | ICD-10-CM

## 2013-08-19 ENCOUNTER — Other Ambulatory Visit (HOSPITAL_COMMUNITY): Payer: Medicare Other

## 2013-08-19 ENCOUNTER — Encounter (HOSPITAL_COMMUNITY): Payer: Medicare Other

## 2013-08-20 ENCOUNTER — Encounter (HOSPITAL_COMMUNITY): Payer: Self-pay | Admitting: Internal Medicine

## 2013-08-27 ENCOUNTER — Ambulatory Visit (HOSPITAL_COMMUNITY): Payer: Medicare Other | Attending: Cardiovascular Disease | Admitting: Radiology

## 2013-08-27 ENCOUNTER — Ambulatory Visit (HOSPITAL_BASED_OUTPATIENT_CLINIC_OR_DEPARTMENT_OTHER): Payer: Medicare Other | Admitting: Radiology

## 2013-08-27 VITALS — BP 134/83 | HR 61 | Ht 62.25 in | Wt 192.0 lb

## 2013-08-27 DIAGNOSIS — R0602 Shortness of breath: Secondary | ICD-10-CM | POA: Diagnosis not present

## 2013-08-27 DIAGNOSIS — I447 Left bundle-branch block, unspecified: Secondary | ICD-10-CM | POA: Diagnosis present

## 2013-08-27 DIAGNOSIS — I059 Rheumatic mitral valve disease, unspecified: Secondary | ICD-10-CM | POA: Insufficient documentation

## 2013-08-27 DIAGNOSIS — R0609 Other forms of dyspnea: Secondary | ICD-10-CM

## 2013-08-27 DIAGNOSIS — R0600 Dyspnea, unspecified: Secondary | ICD-10-CM

## 2013-08-27 DIAGNOSIS — R011 Cardiac murmur, unspecified: Secondary | ICD-10-CM

## 2013-08-27 MED ORDER — TECHNETIUM TC 99M SESTAMIBI GENERIC - CARDIOLITE
11.0000 | Freq: Once | INTRAVENOUS | Status: AC | PRN
Start: 2013-08-27 — End: 2013-08-27
  Administered 2013-08-27: 11 via INTRAVENOUS

## 2013-08-27 MED ORDER — TECHNETIUM TC 99M SESTAMIBI GENERIC - CARDIOLITE
33.0000 | Freq: Once | INTRAVENOUS | Status: AC | PRN
  Administered 2013-08-27: 33 via INTRAVENOUS

## 2013-08-27 MED ORDER — ADENOSINE (DIAGNOSTIC) 3 MG/ML IV SOLN
0.5600 mg/kg | Freq: Once | INTRAVENOUS | Status: AC
  Administered 2013-08-27: 48.9 mg via INTRAVENOUS

## 2013-08-27 NOTE — Progress Notes (Signed)
[facility] [address] [place],  [address] [phone]    Cardiology Nuclear Med Study  Corrie Tomasello is a 78 y.o. female     MRN : [id number]     DOB: 10-11-1935  Procedure Date: 08/27/2013  Nuclear Med Background Indication for Stress Test:  Evaluation for Ischemia and Abnormal EKG History:  No prior Cardiac History Cardiac Risk Factors: Family History - CAD, History of Smoking, Hypertension, LBBB and Lipids  Symptoms:  SOB   Nuclear Pre-Procedure Caffeine/Decaff Intake:  None NPO After: 10:00pm   Lungs:  clear O2 Sat: 96% on room air. IV 0.9% NS with Angio Cath:  22g  IV Site: R Antecubital  IV Started by:  Crissie Figures, RN  Chest Size (in):  44 Cup Size: D  Height: 5' 2.25" (1.581 m)  Weight:  192 lb (87.091 kg)  BMI:  Body mass index is 34.84 kg/(m^2). Tech Comments:  N/A    Nuclear Med Study 1 or 2 day study: 1 day  Stress Test Type:  Adenosine  Reading MD: N/A  Order Authorizing Provider:  [name], MD  Resting Radionuclide: Technetium 64m Sestamibi  Resting Radionuclide Dose: 11.0 mCi   Stress Radionuclide:  Technetium 77m Sestamibi  Stress Radionuclide Dose: 33.0 mCi           Stress Protocol Rest HR: 61 Stress HR: 75  Rest BP: 134/83 Stress BP: 146/76  Exercise Time (min): n/a METS: n/a           Dose of Adenosine (mg):  48.9 mg Dose of Lexiscan: n/a mg  Dose of Atropine (mg): n/a Dose of Dobutamine: n/a mcg/kg/min (at max HR)  Stress Test Technologist: Glade Lloyd, BS-ES  Nuclear Technologist:  Vedia Pereyra, CNMT     Rest Procedure:  Myocardial perfusion imaging was performed at rest 45 minutes following the intravenous administration of Technetium 13m Sestamibi. Rest ECG: NSR-LBBB  Stress Procedure:  The patient received IV adenosine at 140 mcg/kg/min for 4 minutes.  Technetium 16m Sestamibi was injected at the 2 minute mark and quantitative spect images were obtained after a 45 minute delay.   During the infusion of Adenosine the patient complained of feeling hot and stomach upset.  These symptoms resolved in recovery.  Stress ECG: No significant change from baseline ECG  QPS Raw Data Images:  Mild diaphragmatic attenuation.  Normal left ventricular size. Stress Images:  There is decreased uptake in the inferior wall. Rest Images:  Comparison with the stress images reveals no significant change. Subtraction (SDS):  There is a fixed inferior defect that is most consistent with diaphragmatic attenuation. Transient Ischemic Dilatation (Normal <1.22):  1.04 Lung/Heart Ratio (Normal <0.45):  0.25  Quantitative Gated Spect Images QGS EDV:  85 ml QGS ESV:  34 ml  Impression Exercise Capacity:  Adenosine study with no exercise. BP Response:  Normal blood pressure response. Clinical Symptoms:  No significant symptoms noted. ECG Impression:  Baseline:  LBBB.  EKG uninterpretable due to LBBB at rest and stress. Comparison with Prior Nuclear Study: No previous nuclear study performed  Overall Impression:  Low risk stress nuclear study with diaphragmatic attenuation artifact.  LV Ejection Fraction: 60%.  LV Wall Motion:  NL LV Function; NL Wall Motion  Sanda Klein, MD, [facility] [phone] office [phone] pager

## 2013-08-27 NOTE — Progress Notes (Signed)
Echocardiogram performed.  

## 2013-09-07 ENCOUNTER — Other Ambulatory Visit: Payer: Self-pay | Admitting: Internal Medicine

## 2013-09-07 DIAGNOSIS — H409 Unspecified glaucoma: Secondary | ICD-10-CM | POA: Diagnosis not present

## 2013-09-07 DIAGNOSIS — H2510 Age-related nuclear cataract, unspecified eye: Secondary | ICD-10-CM | POA: Diagnosis not present

## 2013-09-07 DIAGNOSIS — H4011X0 Primary open-angle glaucoma, stage unspecified: Secondary | ICD-10-CM | POA: Diagnosis not present

## 2013-09-15 ENCOUNTER — Encounter: Payer: Self-pay | Admitting: Cardiology

## 2013-09-15 ENCOUNTER — Ambulatory Visit (INDEPENDENT_AMBULATORY_CARE_PROVIDER_SITE_OTHER): Payer: Medicare Other | Admitting: Cardiology

## 2013-09-15 VITALS — BP 132/74 | HR 72 | Ht 62.0 in | Wt 188.5 lb

## 2013-09-15 DIAGNOSIS — I447 Left bundle-branch block, unspecified: Secondary | ICD-10-CM | POA: Diagnosis not present

## 2013-09-15 DIAGNOSIS — R0609 Other forms of dyspnea: Secondary | ICD-10-CM | POA: Diagnosis not present

## 2013-09-15 DIAGNOSIS — I5189 Other ill-defined heart diseases: Secondary | ICD-10-CM

## 2013-09-15 DIAGNOSIS — I10 Essential (primary) hypertension: Secondary | ICD-10-CM | POA: Diagnosis not present

## 2013-09-15 DIAGNOSIS — R609 Edema, unspecified: Secondary | ICD-10-CM | POA: Diagnosis not present

## 2013-09-15 DIAGNOSIS — R0989 Other specified symptoms and signs involving the circulatory and respiratory systems: Secondary | ICD-10-CM

## 2013-09-15 DIAGNOSIS — I519 Heart disease, unspecified: Secondary | ICD-10-CM

## 2013-09-15 DIAGNOSIS — R0600 Dyspnea, unspecified: Secondary | ICD-10-CM

## 2013-09-15 NOTE — Assessment & Plan Note (Signed)
The patient is doing well on current diuretic therapy of furosemide 40 mg twice a day.  Her peripheral edema has improved.

## 2013-09-15 NOTE — Patient Instructions (Signed)
Your physician recommends that you continue on your current medications as directed. Please refer to the Current Medication list given to you today.   Your physician wants you to follow-up in: [place] will receive a reminder letter in the mail two months in advance. If you don't receive a letter, please call our office to schedule the follow-up appointment.

## 2013-09-15 NOTE — Assessment & Plan Note (Signed)
She has not been having any symptoms from her left bundle branch block.  No dizziness or syncope

## 2013-09-15 NOTE — Progress Notes (Signed)
[name] Date of Birth:  08/10/1935 [place] [address] [place], [place]  [address] [phone]        Fax   [phone]   History of Present Illness: This pleasant 78 year old woman is seen for a followup office visit.  We initially saw her on 08/05/13 at which point she was complaining of shortness of breath and she had had an EKG done on 07/19/13 which showed a new left bundle branch block.  She had been experiencing some peripheral edema. Her family history reveals her father had a heart attack in his 5s but lived until age 11.  He died of COPD.  The patient's mother died of cancer. The patient has a past history of hypercholesterolemia and essential hypertension. Since last visit the patient had an echocardiogram on 08/27/13 which showed normal left ventricular systolic function with an ejection fraction of 55-60% and there was grade 1 diastolic dysfunction. On 08/28/13 she underwent an adenosine Myoview stress test which showed no ischemia and her ejection fraction was 60% Since last visit she has cut back on salt and on calories.  She has been able to lose 7 pounds and her dyspnea has improved.  Current Outpatient Prescriptions  Medication Sig Dispense Refill  . buPROPion (WELLBUTRIN SR) 150 MG 12 hr tablet take 1 tablet by mouth twice a day  180 tablet  2  . calcium-vitamin D (OSCAL WITH D) 500-200 MG-UNIT per tablet Take 1 tablet by mouth 2 (two) times daily.      . carvedilol (COREG) 12.5 MG tablet take 1 tablet by mouth twice a day  180 tablet  1  . cholecalciferol (VITAMIN D) 1000 UNITS tablet Take 1,000 Units by mouth daily.      . colestipol (COLESTID) 1 G tablet Take 2 tablets (2 g total) by mouth 2 (two) times daily.      . dorzolamide-timolol (COSOPT) 22.3-6.8 MG/ML ophthalmic solution Place 1 drop into both eyes 2 (two) times daily.      . fish oil-omega-3 fatty acids 1000 MG capsule Take 1 g by mouth daily.      . furosemide (LASIX) 40 MG  tablet take 1 tablet by mouth once daily  90 tablet  1  . lisinopril (PRINIVIL,ZESTRIL) 30 MG tablet take 1 tablet by mouth once daily  90 tablet  1  . Multiple Vitamin (MULITIVITAMIN WITH MINERALS) TABS Take 1 tablet by mouth daily.      . traMADol (ULTRAM) 50 MG tablet take 1 tablet by mouth twice a day  60 tablet  5  . cephALEXin (KEFLEX) 500 MG capsule Take 1 capsule (500 mg total) by mouth 4 (four) times daily.  21 capsule  0   No current facility-administered medications for this visit.    No Known Allergies  Patient Active Problem List   Diagnosis Date Noted  . LBBB (left bundle branch block) 07/19/2013  . Obesity (BMI 30-39.9) 11/11/2012  . Chronic diarrhea   . Hypertension   . OAB (overactive bladder)   . Depression   . Osteopenia   . Symptoms referable to back 01/07/2012  . Cervical spondylosis 01/07/2012  . Degenerative arthritis of hip 06/01/2011  . Cataract 11/17/2010  . Glaucoma 11/17/2010    History  Smoking status  . Former Smoker  . Quit date: 05/27/1980  Smokeless tobacco  . Not on file    History  Alcohol Use No    Family History  Problem Relation Age of Onset  .  Ovarian cancer Mother   . Alcohol abuse Father   . Stroke Brother     Review of Systems: Constitutional: no fever chills diaphoresis or fatigue or change in weight.  Head and neck: no hearing loss, no epistaxis, no photophobia or visual disturbance. Respiratory: No cough, shortness of breath or wheezing. Cardiovascular: No chest pain peripheral edema, palpitations. Gastrointestinal: No abdominal distention, no abdominal pain, no change in bowel habits hematochezia or melena. Genitourinary: No dysuria, no frequency, no urgency, no nocturia. Musculoskeletal:No arthralgias, no back pain, no gait disturbance or myalgias. Neurological: No dizziness, no headaches, no numbness, no seizures, no syncope, no weakness, no tremors. Hematologic: No lymphadenopathy, no easy bruising. Psychiatric:  No confusion, no hallucinations, no sleep disturbance.    Physical Exam: Filed Vitals:   09/15/13 0926  BP: 132/74  Pulse: 72   general appearance reveals a well-developed well-nourished woman in no distress.The head and neck exam reveals pupils equal and reactive.  Extraocular movements are full.  There is no scleral icterus.  The mouth and pharynx are normal.  The neck is supple.  The carotids reveal no bruits.  The jugular venous pressure is normal.  The  thyroid is not enlarged.  There is no lymphadenopathy.  The chest is clear to percussion and auscultation.  There are no rales or rhonchi.  Expansion of the chest is symmetrical.  The precordium is quiet.  The first heart sound is normal.  The second heart sound is physiologically split.  There is no murmur gallop rub or click.  There is no abnormal lift or heave.  The abdomen is soft and nontender.  The bowel sounds are normal.  The liver and spleen are not enlarged.  There are no abdominal masses.  There are no abdominal bruits.  Extremities reveal good pedal pulses.  There is no phlebitis or edema.  There is no cyanosis or clubbing.  Strength is normal and symmetrical in all extremities.  There is no lateralizing weakness.  There are no sensory deficits.  The skin is warm and dry.  There is no rash.     Assessment / Plan: 1. hypertensive heart disease 2. diastolic dysfunction 3. left bundle branch block 4. peripheral edema  Disposition: Continue same medication.  Continue with weight loss diet.  Continue close followup with Dr. Nyoka Cowden.  Recheck here in 6 months for office visit and EKG.

## 2013-09-15 NOTE — Assessment & Plan Note (Signed)
Blood pressure has improved since she has lost weight and cut back on salt.  She is on furosemide 40 mg twice a day with good response.

## 2013-09-23 ENCOUNTER — Other Ambulatory Visit: Payer: Self-pay

## 2013-09-23 DIAGNOSIS — Z1231 Encounter for screening mammogram for malignant neoplasm of breast: Secondary | ICD-10-CM

## 2013-09-29 ENCOUNTER — Ambulatory Visit: Payer: Medicare Other

## 2013-10-01 ENCOUNTER — Ambulatory Visit: Payer: Medicare Other

## 2013-10-22 DIAGNOSIS — M7981 Nontraumatic hematoma of soft tissue: Secondary | ICD-10-CM | POA: Diagnosis not present

## 2013-10-22 DIAGNOSIS — R233 Spontaneous ecchymoses: Secondary | ICD-10-CM | POA: Diagnosis not present

## 2013-11-05 ENCOUNTER — Other Ambulatory Visit: Payer: Self-pay

## 2013-11-05 ENCOUNTER — Other Ambulatory Visit: Payer: Self-pay | Admitting: Internal Medicine

## 2013-11-05 DIAGNOSIS — N6320 Unspecified lump in the left breast, unspecified quadrant: Secondary | ICD-10-CM

## 2013-11-05 DIAGNOSIS — I10 Essential (primary) hypertension: Secondary | ICD-10-CM | POA: Diagnosis not present

## 2013-11-05 DIAGNOSIS — M1990 Unspecified osteoarthritis, unspecified site: Secondary | ICD-10-CM | POA: Diagnosis not present

## 2013-11-05 DIAGNOSIS — Z23 Encounter for immunization: Secondary | ICD-10-CM | POA: Diagnosis not present

## 2013-11-05 DIAGNOSIS — Z0000 Encounter for general adult medical examination without abnormal findings: Secondary | ICD-10-CM | POA: Diagnosis not present

## 2013-11-05 DIAGNOSIS — E780 Pure hypercholesterolemia: Secondary | ICD-10-CM | POA: Diagnosis not present

## 2013-11-05 DIAGNOSIS — E039 Hypothyroidism, unspecified: Secondary | ICD-10-CM | POA: Diagnosis not present

## 2013-11-05 DIAGNOSIS — N63 Unspecified lump in breast: Secondary | ICD-10-CM | POA: Diagnosis not present

## 2013-11-11 ENCOUNTER — Ambulatory Visit
Admission: RE | Admit: 2013-11-11 | Discharge: 2013-11-11 | Disposition: A | Discharge status: Home / Self Care | Payer: Medicare Other | Source: Ambulatory Visit | Attending: Internal Medicine | Admitting: Internal Medicine

## 2013-11-11 ENCOUNTER — Encounter (INDEPENDENT_AMBULATORY_CARE_PROVIDER_SITE_OTHER): Payer: Self-pay

## 2013-11-11 DIAGNOSIS — N6320 Unspecified lump in the left breast, unspecified quadrant: Secondary | ICD-10-CM

## 2013-11-11 DIAGNOSIS — N6002 Solitary cyst of left breast: Secondary | ICD-10-CM | POA: Diagnosis not present

## 2013-11-25 DIAGNOSIS — H4011X3 Primary open-angle glaucoma, severe stage: Secondary | ICD-10-CM | POA: Diagnosis not present

## 2013-11-25 DIAGNOSIS — H4011X2 Primary open-angle glaucoma, moderate stage: Secondary | ICD-10-CM | POA: Diagnosis not present

## 2013-11-25 DIAGNOSIS — H2511 Age-related nuclear cataract, right eye: Secondary | ICD-10-CM | POA: Diagnosis not present

## 2013-12-03 DIAGNOSIS — M25561 Pain in right knee: Secondary | ICD-10-CM | POA: Diagnosis not present

## 2013-12-17 DIAGNOSIS — M25561 Pain in right knee: Secondary | ICD-10-CM | POA: Diagnosis not present

## 2013-12-28 DIAGNOSIS — I10 Essential (primary) hypertension: Secondary | ICD-10-CM | POA: Diagnosis not present

## 2013-12-28 DIAGNOSIS — F329 Major depressive disorder, single episode, unspecified: Secondary | ICD-10-CM | POA: Diagnosis not present

## 2014-01-18 DIAGNOSIS — M25561 Pain in right knee: Secondary | ICD-10-CM | POA: Diagnosis not present

## 2014-03-18 ENCOUNTER — Encounter: Payer: Self-pay | Admitting: Cardiology

## 2014-03-18 ENCOUNTER — Ambulatory Visit (INDEPENDENT_AMBULATORY_CARE_PROVIDER_SITE_OTHER): Payer: Medicare Other | Admitting: Cardiology

## 2014-03-18 VITALS — BP 152/80 | HR 63 | Ht 62.0 in | Wt 194.4 lb

## 2014-03-18 DIAGNOSIS — I10 Essential (primary) hypertension: Secondary | ICD-10-CM | POA: Diagnosis not present

## 2014-03-18 DIAGNOSIS — R609 Edema, unspecified: Secondary | ICD-10-CM | POA: Diagnosis not present

## 2014-03-18 DIAGNOSIS — R0609 Other forms of dyspnea: Secondary | ICD-10-CM

## 2014-03-18 DIAGNOSIS — R0600 Dyspnea, unspecified: Secondary | ICD-10-CM

## 2014-03-18 DIAGNOSIS — I447 Left bundle-branch block, unspecified: Secondary | ICD-10-CM

## 2014-03-18 NOTE — Patient Instructions (Addendum)
Your physician recommends that you continue on your current medications as directed. Please refer to the Current Medication list given to you today.  Your physician wants you to follow-up in: [place] will receive a reminder letter in the mail two months in advance. If you don't receive a letter, please call our office to schedule the follow-up appointment.  WORK HARDER ON WEIGHT LOSS   AVOID SALT

## 2014-03-18 NOTE — Progress Notes (Signed)
Cardiology Office Note   Date:  03/18/2014   ID:  [name], DOB [birth year], MRN [id number]  PCP:  Criselda Peaches, MD  Cardiologist:   [name], MD   No chief complaint on file.     History of Present Illness: [name] is a 79 y.o. female who presents for a six-month follow-up office visit.  This pleasant 79 year old woman is seen for a followup office visit. We initially saw her on 08/05/13 at which point she was complaining of shortness of breath and she had had an EKG done on 07/19/13 which showed a new left bundle branch block. She had been experiencing some peripheral edema. Her family history reveals her father had a heart attack in his 69s but lived until age 19. He died of COPD. The patient's mother died of cancer. The patient has a past history of hypercholesterolemia and essential hypertension. The patient had an echocardiogram on 08/27/13 which showed normal left ventricular systolic function with an ejection fraction of 55-60% and there was grade 1 diastolic dysfunction. On 08/28/13 she underwent an adenosine Myoview stress test which showed no ischemia and her ejection fraction was 60% At her last visit she had lost 7 pounds.  On today's visit she has regained 6 pounds.  She has been experiencing some peripheral edema.  She sleeps on 3-4 pillows but does not awaken with paroxysmal nocturnal dyspnea.  She does have nocturia.  She is not a good sleeper.  She wakes up at about 4 AM and reach for a while and then goes back to sleep.  She admits that she has not been taking her Lasix on a regular basis.  She does not take it because she frequently has meetings with friends and it would interfere with her daily activities.  I told her she should try taking it later in the afternoon after her daily activities are finished.  Past Medical History  Diagnosis Date  . Hypertension   . Arthritis   . Osteoporosis   . Headache(784.0)   . Depression   . Glaucoma   .  Degenerative arthritis of hip     s/p THR 05/2011  . OAB (overactive bladder)     Past Surgical History  Procedure Laterality Date  . Abdominal hysterectomy  2003  . Breast cysts      X2 BENIGN  .  cataracts removed  2013 L  . Total hip arthroplasty  06/01/2011    Procedure: TOTAL HIP ARTHROPLASTY ANTERIOR APPROACH;  Surgeon: Mcarthur Rossetti, MD;  Location: [facility];  Service: Orthopedics;  Laterality: Left;  Left Total Hip Replacement, Direct Anterior Approach  . Breast surgery    . Breast biopsy  1960  . Glaucoma valve insertion Bilateral 2013, 2014    bond ([facility])     Current Outpatient Prescriptions  Medication Sig Dispense Refill  . buPROPion (WELLBUTRIN SR) 150 MG 12 hr tablet take 1 tablet by mouth twice a day 180 tablet 2  . calcium-vitamin D (OSCAL WITH D) 500-200 MG-UNIT per tablet Take 1 tablet by mouth 2 (two) times daily.    . carvedilol (COREG) 12.5 MG tablet take 1 tablet by mouth twice a day 180 tablet 1  . cholecalciferol (VITAMIN D) 1000 UNITS tablet Take 1,000 Units by mouth daily.    . colestipol (COLESTID) 1 G tablet Take 2 tablets (2 g total) by mouth 2 (two) times daily.    . dorzolamide-timolol (COSOPT) 22.3-6.8 MG/ML ophthalmic solution Place 1 drop into  both eyes 2 (two) times daily.    . fish oil-omega-3 fatty acids 1000 MG capsule Take 1 g by mouth daily.    . furosemide (LASIX) 40 MG tablet take 1 tablet by mouth once daily 90 tablet 1  . lisinopril (PRINIVIL,ZESTRIL) 30 MG tablet take 1 tablet by mouth once daily 90 tablet 1  . Multiple Vitamin (MULITIVITAMIN WITH MINERALS) TABS Take 1 tablet by mouth daily.    . traMADol (ULTRAM) 50 MG tablet take 1 tablet by mouth twice a day 60 tablet 5   No current facility-administered medications for this visit.    Allergies:   Review of patient's allergies indicates no known allergies.    Social History:  The patient  reports that she quit smoking about 33 years ago. She does not have any smokeless tobacco  history on file. She reports that she does not drink alcohol or use illicit drugs.  Family History:  The patient's family history includes Alcohol abuse in her father; Ovarian cancer in her mother; Stroke in her brother.    ROS:  Please see the history of present illness.   Otherwise, review of systems are positive for none.   All other systems are reviewed and negative.    PHYSICAL EXAM: VS:  BP 152/80 mmHg  Pulse 63  Ht 5\' 2"  (1.575 m)  Wt 194 lb 6.4 oz (88.179 kg)  BMI 35.55 kg/m2 , BMI Body mass index is 35.55 kg/(m^2). GEN: Well nourished, well developed, in no acute distress HEENT: normal Neck: no JVD, carotid bruits, or masses Cardiac: RRR; no murmurs, rubs, or gallops, and there is 2+ pretibial and pedal edema today. Respiratory:  clear to auscultation bilaterally, normal work of breathing GI: soft, nontender, nondistended, + BS MS: no deformity or atrophy Skin: warm and dry, no rash Neuro:  Strength and sensation are intact Psych: euthymic mood, full affect   EKG:  EKG is ordered today. The ekg ordered today demonstrates sinus rhythm with left bundle branch block.  Since 07/19/13, no significant change   Recent Labs: 07/10/2013: BUN 17; Creatinine 1.0; Potassium 4.1; Sodium 138; TSH 1.12    Lipid Panel    Component Value Date/Time   CHOL 163 07/10/2013 1229   TRIG 87.0 07/10/2013 1229   HDL 56.70 07/10/2013 1229   CHOLHDL 3 07/10/2013 1229   VLDL 17.4 07/10/2013 1229   LDLCALC 89 07/10/2013 1229      Wt Readings from Last 3 Encounters:  03/18/14 194 lb 6.4 oz (88.179 kg)  09/15/13 188 lb 8 oz (85.503 kg)  08/27/13 192 lb (87.091 kg)        ASSESSMENT AND PLAN: 1. hypertensive heart disease.  Blood pressure is slightly high today but she is fluid overloaded secondary to not taking her Lasix as prescribed. 2. diastolic dysfunction contributing to her exertional dyspnea, worse since she has not been taking her Lasix 3. left bundle branch block 4.  peripheral edema    Current medicines are reviewed at length with the patient today.  The patient does not have concerns regarding medicines.  The following changes have been made:  no change  Labs/ tests ordered today include: None   Orders Placed This Encounter  Procedures  . EKG 12-Lead   I have encouraged her to start back into a regular exercise program.  In the past she has worked out with Jonette Mate and she intends to restart that again.  She has had some hesitancy because of recent problems with her right  knee.  Dr. Ninfa Linden is her orthopedist and has aspirated serous fluid from her right knee joint on several occasions. The importance of low-salt diet was reemphasized with her again today  Disposition:   FU with Dr. Mare Ferrari in 6 months for office visit   Signed, [name], MD  03/18/2014 3:39 PM    [address], [place], [place]  [address] Phone: [phone]; Fax: [phone]

## 2014-03-24 DIAGNOSIS — M25561 Pain in right knee: Secondary | ICD-10-CM | POA: Diagnosis not present

## 2014-03-29 DIAGNOSIS — H2511 Age-related nuclear cataract, right eye: Secondary | ICD-10-CM | POA: Diagnosis not present

## 2014-03-29 DIAGNOSIS — H4011X3 Primary open-angle glaucoma, severe stage: Secondary | ICD-10-CM | POA: Diagnosis not present

## 2014-03-29 DIAGNOSIS — H4011X2 Primary open-angle glaucoma, moderate stage: Secondary | ICD-10-CM | POA: Diagnosis not present

## 2014-04-15 DIAGNOSIS — M25561 Pain in right knee: Secondary | ICD-10-CM | POA: Diagnosis not present

## 2014-05-11 DIAGNOSIS — I10 Essential (primary) hypertension: Secondary | ICD-10-CM | POA: Diagnosis not present

## 2014-05-11 DIAGNOSIS — F329 Major depressive disorder, single episode, unspecified: Secondary | ICD-10-CM | POA: Diagnosis not present

## 2014-05-21 DIAGNOSIS — M25561 Pain in right knee: Secondary | ICD-10-CM | POA: Diagnosis not present

## 2014-07-09 DIAGNOSIS — M1711 Unilateral primary osteoarthritis, right knee: Secondary | ICD-10-CM | POA: Diagnosis not present

## 2014-07-27 DIAGNOSIS — I10 Essential (primary) hypertension: Secondary | ICD-10-CM | POA: Diagnosis not present

## 2014-08-09 DIAGNOSIS — H4011X2 Primary open-angle glaucoma, moderate stage: Secondary | ICD-10-CM | POA: Diagnosis not present

## 2014-08-09 DIAGNOSIS — H4011X3 Primary open-angle glaucoma, severe stage: Secondary | ICD-10-CM | POA: Diagnosis not present

## 2014-08-09 DIAGNOSIS — H2511 Age-related nuclear cataract, right eye: Secondary | ICD-10-CM | POA: Diagnosis not present

## 2014-08-19 DIAGNOSIS — M1711 Unilateral primary osteoarthritis, right knee: Secondary | ICD-10-CM | POA: Diagnosis not present

## 2014-08-26 DIAGNOSIS — M1711 Unilateral primary osteoarthritis, right knee: Secondary | ICD-10-CM | POA: Diagnosis not present

## 2014-09-01 DIAGNOSIS — M1711 Unilateral primary osteoarthritis, right knee: Secondary | ICD-10-CM | POA: Diagnosis not present

## 2014-10-28 DIAGNOSIS — M1711 Unilateral primary osteoarthritis, right knee: Secondary | ICD-10-CM | POA: Diagnosis not present

## 2014-12-07 DIAGNOSIS — Z0000 Encounter for general adult medical examination without abnormal findings: Secondary | ICD-10-CM | POA: Diagnosis not present

## 2014-12-07 DIAGNOSIS — Z23 Encounter for immunization: Secondary | ICD-10-CM | POA: Diagnosis not present

## 2014-12-07 DIAGNOSIS — E039 Hypothyroidism, unspecified: Secondary | ICD-10-CM | POA: Diagnosis not present

## 2014-12-07 DIAGNOSIS — M1990 Unspecified osteoarthritis, unspecified site: Secondary | ICD-10-CM | POA: Diagnosis not present

## 2014-12-07 DIAGNOSIS — K635 Polyp of colon: Secondary | ICD-10-CM | POA: Diagnosis not present

## 2014-12-07 DIAGNOSIS — I10 Essential (primary) hypertension: Secondary | ICD-10-CM | POA: Diagnosis not present

## 2014-12-09 ENCOUNTER — Ambulatory Visit: Payer: Medicare Other | Admitting: Podiatry

## 2014-12-10 DIAGNOSIS — H401123 Primary open-angle glaucoma, left eye, severe stage: Secondary | ICD-10-CM | POA: Diagnosis not present

## 2014-12-15 DIAGNOSIS — M1711 Unilateral primary osteoarthritis, right knee: Secondary | ICD-10-CM | POA: Diagnosis not present

## 2014-12-16 DIAGNOSIS — I10 Essential (primary) hypertension: Secondary | ICD-10-CM | POA: Diagnosis not present

## 2015-01-27 ENCOUNTER — Other Ambulatory Visit: Payer: Self-pay

## 2015-01-27 DIAGNOSIS — Z1231 Encounter for screening mammogram for malignant neoplasm of breast: Secondary | ICD-10-CM

## 2015-02-15 ENCOUNTER — Ambulatory Visit: Payer: PRIVATE HEALTH INSURANCE

## 2015-02-16 ENCOUNTER — Ambulatory Visit: Payer: Medicare Other | Admitting: Podiatry

## 2015-02-17 ENCOUNTER — Ambulatory Visit: Payer: Medicare Other | Admitting: Podiatry

## 2015-02-21 ENCOUNTER — Ambulatory Visit: Payer: PRIVATE HEALTH INSURANCE

## 2015-02-23 DIAGNOSIS — H401123 Primary open-angle glaucoma, left eye, severe stage: Secondary | ICD-10-CM | POA: Diagnosis not present

## 2015-02-23 DIAGNOSIS — H401112 Primary open-angle glaucoma, right eye, moderate stage: Secondary | ICD-10-CM | POA: Diagnosis not present

## 2015-02-27 ENCOUNTER — Ambulatory Visit: Payer: Self-pay | Admitting: Orthopedic Surgery

## 2015-02-27 NOTE — H&P (Signed)
[name] DOB: [birth year] Divorced / Language: [name] / Race: White Female Date of Admission:  03/14/2015 CC:  Right Knee Pain History of Present Illness The patient is a 80 year old female who comes in for a preoperative History and Physical. The patient is scheduled for a right total knee arthroplasty to be performed by Dr. Dione Plover. Aluisio, MD at [facility] on 03-14-2015. The patient reports right knee symptoms including: pain which began 1 year(s) ago without any known injury. Prior to being seen, the patient was previously evaluated by Dr. Rush Farmer. Past treatment for this problem has included intra-articular injection of corticosteroids (2x, aspiration/cortisone injection). Symptoms are reported to be located in the right knee and include knee pain (that is getting progressively worse), swelling, stiffness and instability (occasionally). She reports that she has been seen for this for the past year or two. She has been seeing Dr. Ninfa Linden. She also saw his PA. She reports that she has been given cortisone injections as well as had fluid aspirated from the knee. Most recent cortisone injection is about four months ago. She said that it helped her about a month. Most of her pain is associated with weightbearing, but she is getting some pain at rest. Occasionally, she has some left knee pain, but it is very insignificant compared to the right knee. She is not having groin pain. No numbness or tingling in the legs. No pain that radiates down from her back. No previous surgeries on the knees. No previous major injuries. Her radiographs are reviewed. She has bone on bone arthritis in the lateral compartments with patellofemoral arthritis also. The knee pain has gotten progressively worse over time. Cortisone and viscosupplements did not help. At this point, the most predictable means of improving her pain and functions may be total knee arthroplasty. She is at a stage where she would like to do  this. She is admitted for total knee replacement.  They have been treated conservatively in the past for the above stated problem and despite conservative measures, they continue to have progressive pain and severe functional limitations and dysfunction. They have failed non-operative management including home exercise, medications, and injections. It is felt that they would benefit from undergoing total joint replacement. Risks and benefits of the procedure have been discussed with the patient and they elect to proceed with surgery. There are no active contraindications to surgery such as ongoing infection or rapidly progressive neurological disease.  Problem List/Past Medical  Primary osteoarthritis of right knee (M17.11)  Left Bundle Branch Block  Overactive bladder (N32.81)  Peripheral Edema  Anxiety Disorder  Chronic Pain  Osteoarthritis  Osteoporosis  High blood pressure  Glaucoma  Cataract  Depression  Allergies  No Known Drug Allergies  Family History Hypertension  father and brother  Social History Alcohol use  current drinker; drinks wine; only occasionally per week Children  1 Current work status  retired Engineer, agricultural (Currently)  no Drug/Alcohol Rehab (Previously)  no Exercise  Exercises weekly; does running / walking Illicit drug use  no Living situation  live alone Marital status  divorced Number of flights of stairs before winded  1 Tobacco / smoke exposure  no Tobacco use  former smoker; smoke(d) 1 pack(s) per day  Medication History Lisinopril (30MG  Tablet, Oral) Active. Furosemide (40MG  Tablet, Oral) Active. Carvedilol (12.5MG  Tablet, Oral) Active. Restasis (0.05% Emulsion, Ophthalmic) Active. Timolol Maleate (0.5% Solution, Ophthalmic) Active. BuPROPion HCl ER (SR) (200MG  Tablet ER 12HR, Oral) Active. [employer] (  Oral) Active. Aspirin (81MG  Tablet Chewable, Oral) Active.  Past Surgical  History Hysterectomy  complete (non-cancerous) Cataract Surgery  left Breast Biopsy  left Mammoplasty; Reduction  bilateral Colectomy  complete   Review of Systems General Not Present- Chills, Fatigue, Fever, Memory Loss, Night Sweats, Weight Gain and Weight Loss. Skin Not Present- Eczema, Hives, Itching, Lesions and Rash. HEENT Not Present- Dentures, Double Vision, Headache, Hearing Loss, Tinnitus and Visual Loss. Respiratory Not Present- Allergies, Chronic Cough, Coughing up blood, Shortness of breath at rest and Shortness of breath with exertion. Cardiovascular Not Present- Chest Pain, Difficulty Breathing Lying Down, Murmur, Palpitations, Racing/skipping heartbeats and Swelling. Gastrointestinal Not Present- Abdominal Pain, Bloody Stool, Constipation, Diarrhea, Difficulty Swallowing, Heartburn, Jaundice, Loss of appetitie, Nausea and Vomiting. Female Genitourinary Not Present- Blood in Urine, Discharge, Flank Pain, Incontinence, Painful Urination, Urgency, Urinary frequency, Urinary Retention, Urinating at Night and Weak urinary stream. Musculoskeletal Present- Joint Pain. Not Present- Back Pain, Joint Swelling, Morning Stiffness, Muscle Pain, Muscle Weakness and Spasms. Neurological Not Present- Blackout spells, Difficulty with balance, Dizziness, Paralysis, Tremor and Weakness. Psychiatric Not Present- Insomnia.  Vitals Weight: 175 lb Height: 60in Weight was reported by patient. Height was reported by patient. Body Surface Area: 1.76 m Body Mass Index: 34.18 kg/m  BP: 126/62 (Sitting, Right Arm, Standard)  Physical Exam General Mental Status -Alert, cooperative and good historian. General Appearance-pleasant, Not in acute distress. Orientation-Oriented X3. Build & Nutrition-Well nourished and Well developed.  Head and Neck Head-normocephalic, atraumatic . Neck Global Assessment - supple, no bruit auscultated on the right, no bruit auscultated on the  left.  Eye Pupil - Bilateral-Regular and Round. Motion - Bilateral-EOMI.  Chest and Lung Exam Auscultation Breath sounds - clear at anterior chest wall and clear at posterior chest wall. Adventitious sounds - No Adventitious sounds.  Cardiovascular Auscultation Rhythm - Regular rate and rhythm. Heart Sounds - S1 WNL and S2 WNL. Murmurs & Other Heart Sounds - Auscultation of the heart reveals - No Murmurs.  Abdomen Palpation/Percussion Tenderness - Abdomen is non-tender to palpation. Rigidity (guarding) - Abdomen is soft. Auscultation Auscultation of the abdomen reveals - Bowel sounds normal.  Female Genitourinary Note: Not done, not pertinent to present illness   Musculoskeletal Note: On exam, she is alert and oriented, in no apparent distress. Her right knee shows no swelling. Range of motion of the right knee is about 5 to 125. She is very tender throughout the knee. There is no instability.  RADIOGRAPHS Her radiographs are reviewed. She has bone on bone arthritis in the lateral compartments with patellofemoral arthritis also.  Assessment & Plan  Primary osteoarthritis of right knee (M17.11)  Note:Surgical Plans: Right Total Knee Replacement  Disposition: Wants to look into [facility] (where she went following a hip replacement).  PCP: Dr. Nyoka Cowden - Patient has been seen preoperatively and felt to be stable for surgery. Patient given a verbal approval to undergo surgery.  IV TXA  Anesthesia Issues: None  Signed electronically by Joelene Millin, III PA-C

## 2015-03-01 ENCOUNTER — Ambulatory Visit: Payer: Self-pay | Admitting: Orthopedic Surgery

## 2015-03-01 NOTE — Progress Notes (Signed)
Preoperative surgical orders have been place into the Epic hospital system for [facility] on 03/01/2015, 8:59 PM  by Mickel Crow for surgery on 03/14/15.  Preop Total Knee orders including Experal, IV Tylenol, and IV Decadron as long as there are no contraindications to the above medications. Arlee Muslim, PA-C

## 2015-03-02 NOTE — Patient Instructions (Addendum)
[name]  03/02/2015   Your procedure is scheduled on: 03-14-15  Report to [facility] Main  Entrance take [facility]  elevators to 3rd floor to  [name] at 1030  AM.  Call this number if you have problems the morning of surgery [phone]   Remember: ONLY 1 PERSON MAY GO WITH YOU TO SHORT STAY TO GET  READY MORNING OF [name].  Do not eat food or drink liquids :After Midnight.     Take these medicines the morning of surgery with A SIP OF WATER: Bupropion. Carvedilol.Tramadol.  Use/ bring Eye drops.  DO NOT TAKE ANY DIABETIC MEDICATIONS DAY OF YOUR SURGERY                               You may not have any metal on your body including hair pins and              piercings  Do not wear jewelry, make-up, lotions, powders or perfumes, deodorant             Do not wear nail polish.  Do not shave  48 hours prior to surgery.              Men may shave face and neck.   Do not bring valuables to the hospital. [name].  Contacts, dentures or bridgework may not be worn into surgery.  Leave suitcase in the car. After surgery it may be brought to your room.     Patients discharged the day of surgery will not be allowed to drive home.  Name and phone number of your driver:  Special Instructions: N/A              Please read over the following fact sheets you were given: _____________________________________________________________________             [facility] - Preparing for Surgery Before surgery, you can play an important role.  Because skin is not sterile, your skin needs to be as free of germs as possible.  You can reduce the number of germs on your skin by washing with CHG (chlorahexidine gluconate) soap before surgery.  CHG is an antiseptic cleaner which kills germs and bonds with the skin to continue killing germs even after washing. Please DO NOT use if you have an allergy to CHG or antibacterial  soaps.  If your skin becomes reddened/irritated stop using the CHG and inform your nurse when you arrive at Short Stay. Do not shave (including legs and underarms) for at least 48 hours prior to the first CHG shower.  You may shave your face/neck. Please follow these instructions carefully:  1.  Shower with CHG Soap the night before surgery and the  morning of Surgery.  2.  If you choose to wash your hair, wash your hair first as usual with your  normal  shampoo.  3.  After you shampoo, rinse your hair and body thoroughly to remove the  shampoo.                           4.  Use CHG as you would any other liquid soap.  You can apply chg directly  to the skin and wash                       Gently with a scrungie or clean washcloth.  5.  Apply the CHG Soap to your body ONLY FROM THE NECK DOWN.   Do not use on face/ open                           Wound or open sores. Avoid contact with eyes, ears mouth and genitals (private parts).                       Wash face,  Genitals (private parts) with your normal soap.             6.  Wash thoroughly, paying special attention to the area where your surgery  will be performed.  7.  Thoroughly rinse your body with warm water from the neck down.  8.  DO NOT shower/wash with your normal soap after using and rinsing off  the CHG Soap.                9.  Pat yourself dry with a clean towel.            10.  Wear clean pajamas.            11.  Place clean sheets on your bed the night of your first shower and do not  sleep with pets. Day of Surgery : Do not apply any lotions/deodorants the morning of surgery.  Please wear clean clothes to the hospital/surgery center.  FAILURE TO FOLLOW THESE INSTRUCTIONS MAY RESULT IN THE CANCELLATION OF YOUR SURGERY PATIENT SIGNATURE_________________________________  NURSE SIGNATURE__________________________________  ________________________________________________________________________   [name]  An  incentive spirometer is a tool that can help keep your lungs clear and active. This tool measures how well you are filling your lungs with each breath. Taking long deep breaths may help reverse or decrease the chance of developing breathing (pulmonary) problems (especially infection) following:  A long period of time when you are unable to move or be active. BEFORE THE PROCEDURE   If the spirometer includes an indicator to show your best effort, your nurse or respiratory therapist will set it to a desired goal.  If possible, sit up straight or lean slightly forward. Try not to slouch.  Hold the incentive spirometer in an upright position. INSTRUCTIONS FOR USE   Sit on the edge of your bed if possible, or sit up as far as you can in bed or on a chair.  Hold the incentive spirometer in an upright position.  Breathe out normally.  Place the mouthpiece in your mouth and seal your lips tightly around it.  Breathe in slowly and as deeply as possible, raising the piston or the ball toward the top of the column.  Hold your breath for 3-5 seconds or for as long as possible. Allow the piston or ball to fall to the bottom of the column.  Remove the mouthpiece from your mouth and breathe out normally.  Rest for a few seconds and repeat Steps 1 through 7 at least 10 times every 1-2 hours when you are awake. Take your time and take a few normal breaths between deep breaths.  The spirometer may include an indicator to show your best effort. Use the indicator as a goal to work toward during each repetition.  After each  set of 10 deep breaths, practice coughing to be sure your lungs are clear. If you have an incision (the cut made at the time of surgery), support your incision when coughing by placing a pillow or rolled up towels firmly against it. Once you are able to get out of bed, walk around indoors and cough well. You may stop using the incentive spirometer when instructed by your caregiver.   RISKS AND COMPLICATIONS  Take your time so you do not get dizzy or light-headed.  If you are in pain, you may need to take or ask for pain medication before doing incentive spirometry. It is harder to take a deep breath if you are having pain. AFTER USE  Rest and breathe slowly and easily.  It can be helpful to keep track of a log of your progress. Your caregiver can provide you with a simple table to help with this. If you are using the spirometer at home, follow these instructions: [place] IF:   You are having difficultly using the spirometer.  You have trouble using the spirometer as often as instructed.  Your pain medication is not giving enough relief while using the spirometer.  You develop fever of 100.5 F (38.1 C) or higher. SEEK IMMEDIATE MEDICAL CARE IF:   You cough up bloody sputum that had not been present before.  You develop fever of 102 F (38.9 C) or greater.  You develop worsening pain at or near the incision site. MAKE SURE YOU:   Understand these instructions.  Will watch your condition.  Will get help right away if you are not doing well or get worse. Document Released: 05/28/2006 Document Revised: 04/09/2011 Document Reviewed: 07/29/2006 Exit[employer] Patient Information 2014 ExitCare, [place].   ________________________________________________________________________  WHAT IS A BLOOD TRANSFUSION? Blood Transfusion Information  A transfusion is the replacement of blood or some of its parts. Blood is made up of multiple cells which provide different functions.  Red blood cells carry oxygen and are used for blood loss replacement.  White blood cells fight against infection.  Platelets control bleeding.  Plasma helps clot blood.  Other blood products are available for specialized needs, such as hemophilia or other clotting disorders. BEFORE THE TRANSFUSION  Who gives blood for transfusions?   Healthy volunteers who are fully evaluated  to make sure their blood is safe. This is blood bank blood. Transfusion therapy is the safest it has ever been in the practice of medicine. Before blood is taken from a donor, a complete history is taken to make sure that person has no history of diseases nor engages in risky social behavior (examples are intravenous drug use or sexual activity with multiple partners). The donor's travel history is screened to minimize risk of transmitting infections, such as malaria. The donated blood is tested for signs of infectious diseases, such as HIV and hepatitis. The blood is then tested to be sure it is compatible with you in order to minimize the chance of a transfusion reaction. If you or a relative donates blood, this is often done in anticipation of surgery and is not appropriate for emergency situations. It takes many days to process the donated blood. RISKS AND COMPLICATIONS Although transfusion therapy is very safe and saves many lives, the main dangers of transfusion include:   Getting an infectious disease.  Developing a transfusion reaction. This is an allergic reaction to something in the blood you were given. Every precaution is taken to prevent this. The decision to have  a blood transfusion has been considered carefully by your caregiver before blood is given. Blood is not given unless the benefits outweigh the risks. AFTER THE TRANSFUSION  Right after receiving a blood transfusion, you will usually feel much better and more energetic. This is especially true if your red blood cells have gotten low (anemic). The transfusion raises the level of the red blood cells which carry oxygen, and this usually causes an energy increase.  The nurse administering the transfusion will monitor you carefully for complications. HOME CARE INSTRUCTIONS  No special instructions are needed after a transfusion. You may find your energy is better. Speak with your caregiver about any limitations on activity for  underlying diseases you may have. SEEK MEDICAL CARE IF:   Your condition is not improving after your transfusion.  You develop redness or irritation at the intravenous (IV) site. SEEK IMMEDIATE MEDICAL CARE IF:  Any of the following symptoms occur over the next 12 hours:  Shaking chills.  You have a temperature by mouth above 102 F (38.9 C), not controlled by medicine.  Chest, back, or muscle pain.  People around you feel you are not acting correctly or are confused.  Shortness of breath or difficulty breathing.  Dizziness and fainting.  You get a rash or develop hives.  You have a decrease in urine output.  Your urine turns a dark color or changes to pink, red, or brown. Any of the following symptoms occur over the next 10 days:  You have a temperature by mouth above 102 F (38.9 C), not controlled by medicine.  Shortness of breath.  Weakness after normal activity.  The white part of the eye turns yellow (jaundice).  You have a decrease in the amount of urine or are urinating less often.  Your urine turns a dark color or changes to pink, red, or brown. Document Released: 01/13/2000 Document Revised: 04/09/2011 Document Reviewed: 09/01/2007 [facility] Patient Information 2014 [place], [place].  _______________________________________________________________________

## 2015-03-03 ENCOUNTER — Encounter (HOSPITAL_COMMUNITY)
Admission: RE | Admit: 2015-03-03 | Discharge: 2015-03-03 | Disposition: A | Discharge status: Home / Self Care | Payer: Medicare Other | Source: Ambulatory Visit | Attending: Internal Medicine | Admitting: Internal Medicine

## 2015-03-08 ENCOUNTER — Inpatient Hospital Stay (HOSPITAL_COMMUNITY)
Admission: RE | Admit: 2015-03-08 | Discharge: 2015-03-08 | Disposition: A | Discharge status: Home / Self Care | Payer: Medicare Other | Source: Ambulatory Visit

## 2015-03-08 NOTE — Patient Instructions (Signed)
[name]  03/08/2015   Your procedure is scheduled on: 03-14-15   Report to [facility] Main  Entrance take [facility]  elevators to 3rd floor to  [place] at  1030 AM.  Call this number if you have problems the morning of surgery [phone]   Remember: ONLY 1 PERSON MAY GO WITH YOU TO SHORT STAY TO GET  READY MORNING OF [place].  Do not eat food or drink liquids :After Midnight.     Take these medicines the morning of surgery with A SIP OF WATER: Bupropion. Carvedilol. Tramadol. Use/bring eye drops. DO NOT TAKE ANY DIABETIC MEDICATIONS DAY OF YOUR SURGERY                               You may not have any metal on your body including hair pins and              piercings  Do not wear jewelry, make-up, lotions, powders or perfumes, deodorant             Do not wear nail polish.  Do not shave  48 hours prior to surgery.              Men may shave face and neck.   Do not bring valuables to the hospital. [place].  Contacts, dentures or bridgework may not be worn into surgery.  Leave suitcase in the car. After surgery it may be brought to your room.     Patients discharged the day of surgery will not be allowed to drive home.  Name and phone number of your driver:  Special Instructions: N/A              Please read over the following fact sheets you were given: _____________________________________________________________________             [facility] - Preparing for Surgery Before surgery, you can play an important role.  Because skin is not sterile, your skin needs to be as free of germs as possible.  You can reduce the number of germs on your skin by washing with CHG (chlorahexidine gluconate) soap before surgery.  CHG is an antiseptic cleaner which kills germs and bonds with the skin to continue killing germs even after washing. Please DO NOT use if you have an allergy to CHG or antibacterial  soaps.  If your skin becomes reddened/irritated stop using the CHG and inform your nurse when you arrive at Short Stay. Do not shave (including legs and underarms) for at least 48 hours prior to the first CHG shower.  You may shave your face/neck. Please follow these instructions carefully:  1.  Shower with CHG Soap the night before surgery and the  morning of Surgery.  2.  If you choose to wash your hair, wash your hair first as usual with your  normal  shampoo.  3.  After you shampoo, rinse your hair and body thoroughly to remove the  shampoo.                           4.  Use CHG as you would any other liquid soap.  You can apply chg directly  to  the skin and wash                       Gently with a scrungie or clean washcloth.  5.  Apply the CHG Soap to your body ONLY FROM THE NECK DOWN.   Do not use on face/ open                           Wound or open sores. Avoid contact with eyes, ears mouth and genitals (private parts).                       Wash face,  Genitals (private parts) with your normal soap.             6.  Wash thoroughly, paying special attention to the area where your surgery  will be performed.  7.  Thoroughly rinse your body with warm water from the neck down.  8.  DO NOT shower/wash with your normal soap after using and rinsing off  the CHG Soap.                9.  Pat yourself dry with a clean towel.            10.  Wear clean pajamas.            11.  Place clean sheets on your bed the night of your first shower and do not  sleep with pets. Day of Surgery : Do not apply any lotions/deodorants the morning of surgery.  Please wear clean clothes to the hospital/surgery center.  FAILURE TO FOLLOW THESE INSTRUCTIONS MAY RESULT IN THE CANCELLATION OF YOUR SURGERY PATIENT SIGNATURE_________________________________  NURSE SIGNATURE__________________________________  ________________________________________________________________________   [name]  An  incentive spirometer is a tool that can help keep your lungs clear and active. This tool measures how well you are filling your lungs with each breath. Taking long deep breaths may help reverse or decrease the chance of developing breathing (pulmonary) problems (especially infection) following:  A long period of time when you are unable to move or be active. BEFORE THE PROCEDURE   If the spirometer includes an indicator to show your best effort, your nurse or respiratory therapist will set it to a desired goal.  If possible, sit up straight or lean slightly forward. Try not to slouch.  Hold the incentive spirometer in an upright position. INSTRUCTIONS FOR USE   Sit on the edge of your bed if possible, or sit up as far as you can in bed or on a chair.  Hold the incentive spirometer in an upright position.  Breathe out normally.  Place the mouthpiece in your mouth and seal your lips tightly around it.  Breathe in slowly and as deeply as possible, raising the piston or the ball toward the top of the column.  Hold your breath for 3-5 seconds or for as long as possible. Allow the piston or ball to fall to the bottom of the column.  Remove the mouthpiece from your mouth and breathe out normally.  Rest for a few seconds and repeat Steps 1 through 7 at least 10 times every 1-2 hours when you are awake. Take your time and take a few normal breaths between deep breaths.  The spirometer may include an indicator to show your best effort. Use the indicator as a goal to work toward during each repetition.  After each set  of 10 deep breaths, practice coughing to be sure your lungs are clear. If you have an incision (the cut made at the time of surgery), support your incision when coughing by placing a pillow or rolled up towels firmly against it. Once you are able to get out of bed, walk around indoors and cough well. You may stop using the incentive spirometer when instructed by your caregiver.   RISKS AND COMPLICATIONS  Take your time so you do not get dizzy or light-headed.  If you are in pain, you may need to take or ask for pain medication before doing incentive spirometry. It is harder to take a deep breath if you are having pain. AFTER USE  Rest and breathe slowly and easily.  It can be helpful to keep track of a log of your progress. Your caregiver can provide you with a simple table to help with this. If you are using the spirometer at home, follow these instructions: [place] IF:   You are having difficultly using the spirometer.  You have trouble using the spirometer as often as instructed.  Your pain medication is not giving enough relief while using the spirometer.  You develop fever of 100.5 F (38.1 C) or higher. SEEK IMMEDIATE MEDICAL CARE IF:   You cough up bloody sputum that had not been present before.  You develop fever of 102 F (38.9 C) or greater.  You develop worsening pain at or near the incision site. MAKE SURE YOU:   Understand these instructions.  Will watch your condition.  Will get help right away if you are not doing well or get worse. Document Released: 05/28/2006 Document Revised: 04/09/2011 Document Reviewed: 07/29/2006 Exit[employer] Patient Information 2014 [employer], [place].   ________________________________________________________________________  WHAT IS A BLOOD TRANSFUSION? Blood Transfusion Information  A transfusion is the replacement of blood or some of its parts. Blood is made up of multiple cells which provide different functions.  Red blood cells carry oxygen and are used for blood loss replacement.  White blood cells fight against infection.  Platelets control bleeding.  Plasma helps clot blood.  Other blood products are available for specialized needs, such as hemophilia or other clotting disorders. BEFORE THE TRANSFUSION  Who gives blood for transfusions?   Healthy volunteers who are fully evaluated  to make sure their blood is safe. This is blood bank blood. Transfusion therapy is the safest it has ever been in the practice of medicine. Before blood is taken from a donor, a complete history is taken to make sure that person has no history of diseases nor engages in risky social behavior (examples are intravenous drug use or sexual activity with multiple partners). The donor's travel history is screened to minimize risk of transmitting infections, such as malaria. The donated blood is tested for signs of infectious diseases, such as HIV and hepatitis. The blood is then tested to be sure it is compatible with you in order to minimize the chance of a transfusion reaction. If you or a relative donates blood, this is often done in anticipation of surgery and is not appropriate for emergency situations. It takes many days to process the donated blood. RISKS AND COMPLICATIONS Although transfusion therapy is very safe and saves many lives, the main dangers of transfusion include:   Getting an infectious disease.  Developing a transfusion reaction. This is an allergic reaction to something in the blood you were given. Every precaution is taken to prevent this. The decision to have a  blood transfusion has been considered carefully by your caregiver before blood is given. Blood is not given unless the benefits outweigh the risks. AFTER THE TRANSFUSION  Right after receiving a blood transfusion, you will usually feel much better and more energetic. This is especially true if your red blood cells have gotten low (anemic). The transfusion raises the level of the red blood cells which carry oxygen, and this usually causes an energy increase.  The nurse administering the transfusion will monitor you carefully for complications. HOME CARE INSTRUCTIONS  No special instructions are needed after a transfusion. You may find your energy is better. Speak with your caregiver about any limitations on activity for  underlying diseases you may have. SEEK MEDICAL CARE IF:   Your condition is not improving after your transfusion.  You develop redness or irritation at the intravenous (IV) site. SEEK IMMEDIATE MEDICAL CARE IF:  Any of the following symptoms occur over the next 12 hours:  Shaking chills.  You have a temperature by mouth above 102 F (38.9 C), not controlled by medicine.  Chest, back, or muscle pain.  People around you feel you are not acting correctly or are confused.  Shortness of breath or difficulty breathing.  Dizziness and fainting.  You get a rash or develop hives.  You have a decrease in urine output.  Your urine turns a dark color or changes to pink, red, or brown. Any of the following symptoms occur over the next 10 days:  You have a temperature by mouth above 102 F (38.9 C), not controlled by medicine.  Shortness of breath.  Weakness after normal activity.  The white part of the eye turns yellow (jaundice).  You have a decrease in the amount of urine or are urinating less often.  Your urine turns a dark color or changes to pink, red, or brown. Document Released: 01/13/2000 Document Revised: 04/09/2011 Document Reviewed: 09/01/2007 [facility] Patient Information 2014 [place], [place].  _______________________________________________________________________

## 2015-03-09 ENCOUNTER — Encounter (HOSPITAL_COMMUNITY)
Admission: RE | Admit: 2015-03-09 | Discharge: 2015-03-09 | Disposition: A | Discharge status: Home / Self Care | Payer: Medicare Other | Source: Ambulatory Visit | Attending: Orthopedic Surgery | Admitting: Orthopedic Surgery

## 2015-03-09 ENCOUNTER — Encounter (HOSPITAL_COMMUNITY): Payer: Self-pay

## 2015-03-09 ENCOUNTER — Other Ambulatory Visit: Payer: Self-pay

## 2015-03-09 DIAGNOSIS — R001 Bradycardia, unspecified: Secondary | ICD-10-CM | POA: Diagnosis not present

## 2015-03-09 DIAGNOSIS — I10 Essential (primary) hypertension: Secondary | ICD-10-CM | POA: Diagnosis not present

## 2015-03-09 DIAGNOSIS — Z01812 Encounter for preprocedural laboratory examination: Secondary | ICD-10-CM | POA: Diagnosis not present

## 2015-03-09 DIAGNOSIS — Z0183 Encounter for blood typing: Secondary | ICD-10-CM | POA: Diagnosis not present

## 2015-03-09 DIAGNOSIS — M1711 Unilateral primary osteoarthritis, right knee: Secondary | ICD-10-CM | POA: Insufficient documentation

## 2015-03-09 DIAGNOSIS — Z01818 Encounter for other preprocedural examination: Secondary | ICD-10-CM | POA: Diagnosis not present

## 2015-03-09 LAB — COMPREHENSIVE METABOLIC PANEL
ALT: 22 U/L (ref 14–54)
AST: 23 U/L (ref 15–41)
Albumin: 4 g/dL (ref 3.5–5.0)
Alkaline Phosphatase: 95 U/L (ref 38–126)
Anion gap: 7 (ref 5–15)
BUN: 21 mg/dL — ABNORMAL HIGH (ref 6–20)
CO2: 28 mmol/L (ref 22–32)
Calcium: 9.7 mg/dL (ref 8.9–10.3)
Chloride: 102 mmol/L (ref 101–111)
Creatinine, Ser: 1.13 mg/dL — ABNORMAL HIGH (ref 0.44–1.00)
GFR calc Af Amer: 53 mL/min — ABNORMAL LOW (ref >60)
GFR calc non Af Amer: 46 mL/min — ABNORMAL LOW (ref >60)
Glucose, Bld: 90 mg/dL (ref 65–99)
Potassium: 4.1 mmol/L (ref 3.5–5.1)
Sodium: 137 mmol/L (ref 135–145)
Total Bilirubin: 0.4 mg/dL (ref 0.3–1.2)
Total Protein: 7.1 g/dL (ref 6.5–8.1)

## 2015-03-09 LAB — CBC
HCT: 40.7 % (ref 36.0–46.0)
Hemoglobin: 13.7 g/dL (ref 12.0–15.0)
MCH: 32 pg (ref 26.0–34.0)
MCHC: 33.7 g/dL (ref 30.0–36.0)
MCV: 95.1 fL (ref 78.0–100.0)
Platelets: 211 10*3/uL (ref 150–400)
RBC: 4.28 MIL/uL (ref 3.87–5.11)
RDW: 13.4 % (ref 11.5–15.5)
WBC: 4.7 10*3/uL (ref 4.0–10.5)

## 2015-03-09 LAB — PROTIME-INR
INR: 1.16 (ref 0.00–1.49)
Prothrombin Time: 14.5 seconds (ref 11.6–15.2)

## 2015-03-09 LAB — SURGICAL PCR SCREEN
MRSA, PCR: NEGATIVE
Staphylococcus aureus: POSITIVE — AB

## 2015-03-09 LAB — APTT: aPTT: 55 seconds — ABNORMAL HIGH (ref 24–37)

## 2015-03-09 NOTE — Pre-Procedure Instructions (Addendum)
EKG 03-18-14 Epic. Stress 7'15 ,Echo 7'15 epic. 03-11-15 1345 Clean catch urine specimen submitted today. Pt. States visited with Dr. Mare Ferrari, cardiology- gave Clearance for surgery -note in Epic. 03-11-15 1515 fax note sent Dr. Wynelle Link [phone]  per Epic to note urinalysis result.

## 2015-03-09 NOTE — Patient Instructions (Signed)
Sadiyah Theilen  03/09/2015   Your procedure is scheduled on: 03-14-15  Report to [facility] Main  Entrance take [facility]  elevators to 3rd floor to  [place] at   1030 AM.  Call this number if you have problems the morning of surgery [phone]   Remember: ONLY 1 PERSON MAY GO WITH YOU TO SHORT STAY TO GET  READY MORNING OF [place].  Do not eat food or drink liquids :After Midnight.     Take these medicines the morning of surgery with A SIP OF WATER: Bupropion. Carvedilol.. Tramadol-if need. Use/bring eye drops. DO NOT TAKE ANY DIABETIC MEDICATIONS DAY OF YOUR SURGERY                               You may not have any metal on your body including hair pins and              piercings  Do not wear jewelry, make-up, lotions, powders or perfumes, deodorant             Do not wear nail polish.  Do not shave  48 hours prior to surgery.              Men may shave face and neck.   Do not bring valuables to the hospital. [place].  Contacts, dentures or bridgework may not be worn into surgery.  Leave suitcase in the car. After surgery it may be brought to your room.     Patients discharged the day of surgery will not be allowed to drive home.  Name and phone number of your driver: son- Brittlyn Romani  [phone]  Special Instructions: N/A              Please read over the following fact sheets you were given: _____________________________________________________________________             [facility] - Preparing for Surgery Before surgery, you can play an important role.  Because skin is not sterile, your skin needs to be as free of germs as possible.  You can reduce the number of germs on your skin by washing with CHG (chlorahexidine gluconate) soap before surgery.  CHG is an antiseptic cleaner which kills germs and bonds with the skin to continue killing germs even after washing. Please DO NOT use if you  have an allergy to CHG or antibacterial soaps.  If your skin becomes reddened/irritated stop using the CHG and inform your nurse when you arrive at Short Stay. Do not shave (including legs and underarms) for at least 48 hours prior to the first CHG shower.  You may shave your face/neck. Please follow these instructions carefully:  1.  Shower with CHG Soap the night before surgery and the  morning of Surgery.  2.  If you choose to wash your hair, wash your hair first as usual with your  normal  shampoo.  3.  After you shampoo, rinse your hair and body thoroughly to remove the  shampoo.                           4.  Use CHG as you would any other liquid soap.  You  can apply chg directly  to the skin and wash                       Gently with a scrungie or clean washcloth.  5.  Apply the CHG Soap to your body ONLY FROM THE NECK DOWN.   Do not use on face/ open                           Wound or open sores. Avoid contact with eyes, ears mouth and genitals (private parts).                       Wash face,  Genitals (private parts) with your normal soap.             6.  Wash thoroughly, paying special attention to the area where your surgery  will be performed.  7.  Thoroughly rinse your body with warm water from the neck down.  8.  DO NOT shower/wash with your normal soap after using and rinsing off  the CHG Soap.                9.  Pat yourself dry with a clean towel.            10.  Wear clean pajamas.            11.  Place clean sheets on your bed the night of your first shower and do not  sleep with pets. Day of Surgery : Do not apply any lotions/deodorants the morning of surgery.  Please wear clean clothes to the hospital/surgery center.  FAILURE TO FOLLOW THESE INSTRUCTIONS MAY RESULT IN THE CANCELLATION OF YOUR SURGERY PATIENT SIGNATURE_________________________________  NURSE  SIGNATURE__________________________________  ________________________________________________________________________   [name]  An incentive spirometer is a tool that can help keep your lungs clear and active. This tool measures how well you are filling your lungs with each breath. Taking long deep breaths may help reverse or decrease the chance of developing breathing (pulmonary) problems (especially infection) following:  A long period of time when you are unable to move or be active. BEFORE THE PROCEDURE   If the spirometer includes an indicator to show your best effort, your nurse or respiratory therapist will set it to a desired goal.  If possible, sit up straight or lean slightly forward. Try not to slouch.  Hold the incentive spirometer in an upright position. INSTRUCTIONS FOR USE   Sit on the edge of your bed if possible, or sit up as far as you can in bed or on a chair.  Hold the incentive spirometer in an upright position.  Breathe out normally.  Place the mouthpiece in your mouth and seal your lips tightly around it.  Breathe in slowly and as deeply as possible, raising the piston or the ball toward the top of the column.  Hold your breath for 3-5 seconds or for as long as possible. Allow the piston or ball to fall to the bottom of the column.  Remove the mouthpiece from your mouth and breathe out normally.  Rest for a few seconds and repeat Steps 1 through 7 at least 10 times every 1-2 hours when you are awake. Take your time and take a few normal breaths between deep breaths.  The spirometer may include an indicator to show your best effort. Use the indicator as a goal to work toward during  each repetition.  After each set of 10 deep breaths, practice coughing to be sure your lungs are clear. If you have an incision (the cut made at the time of surgery), support your incision when coughing by placing a pillow or rolled up towels firmly against it. Once  you are able to get out of bed, walk around indoors and cough well. You may stop using the incentive spirometer when instructed by your caregiver.  RISKS AND COMPLICATIONS  Take your time so you do not get dizzy or light-headed.  If you are in pain, you may need to take or ask for pain medication before doing incentive spirometry. It is harder to take a deep breath if you are having pain. AFTER USE  Rest and breathe slowly and easily.  It can be helpful to keep track of a log of your progress. Your caregiver can provide you with a simple table to help with this. If you are using the spirometer at home, follow these instructions: [place] IF:   You are having difficultly using the spirometer.  You have trouble using the spirometer as often as instructed.  Your pain medication is not giving enough relief while using the spirometer.  You develop fever of 100.5 F (38.1 C) or higher. SEEK IMMEDIATE MEDICAL CARE IF:   You cough up bloody sputum that had not been present before.  You develop fever of 102 F (38.9 C) or greater.  You develop worsening pain at or near the incision site. MAKE SURE YOU:   Understand these instructions.  Will watch your condition.  Will get help right away if you are not doing well or get worse. Document Released: 05/28/2006 Document Revised: 04/09/2011 Document Reviewed: 07/29/2006 ExitCare Patient Information 2014 ExitCare, [place].   ________________________________________________________________________  WHAT IS A BLOOD TRANSFUSION? Blood Transfusion Information  A transfusion is the replacement of blood or some of its parts. Blood is made up of multiple cells which provide different functions.  Red blood cells carry oxygen and are used for blood loss replacement.  White blood cells fight against infection.  Platelets control bleeding.  Plasma helps clot blood.  Other blood products are available for specialized needs, such as  hemophilia or other clotting disorders. BEFORE THE TRANSFUSION  Who gives blood for transfusions?   Healthy volunteers who are fully evaluated to make sure their blood is safe. This is blood bank blood. Transfusion therapy is the safest it has ever been in the practice of medicine. Before blood is taken from a donor, a complete history is taken to make sure that person has no history of diseases nor engages in risky social behavior (examples are intravenous drug use or sexual activity with multiple partners). The donor's travel history is screened to minimize risk of transmitting infections, such as malaria. The donated blood is tested for signs of infectious diseases, such as HIV and hepatitis. The blood is then tested to be sure it is compatible with you in order to minimize the chance of a transfusion reaction. If you or a relative donates blood, this is often done in anticipation of surgery and is not appropriate for emergency situations. It takes many days to process the donated blood. RISKS AND COMPLICATIONS Although transfusion therapy is very safe and saves many lives, the main dangers of transfusion include:   Getting an infectious disease.  Developing a transfusion reaction. This is an allergic reaction to something in the blood you were given. Every precaution is taken to prevent  this. The decision to have a blood transfusion has been considered carefully by your caregiver before blood is given. Blood is not given unless the benefits outweigh the risks. AFTER THE TRANSFUSION  Right after receiving a blood transfusion, you will usually feel much better and more energetic. This is especially true if your red blood cells have gotten low (anemic). The transfusion raises the level of the red blood cells which carry oxygen, and this usually causes an energy increase.  The nurse administering the transfusion will monitor you carefully for complications. HOME CARE INSTRUCTIONS  No special  instructions are needed after a transfusion. You may find your energy is better. Speak with your caregiver about any limitations on activity for underlying diseases you may have. SEEK MEDICAL CARE IF:   Your condition is not improving after your transfusion.  You develop redness or irritation at the intravenous (IV) site. SEEK IMMEDIATE MEDICAL CARE IF:  Any of the following symptoms occur over the next 12 hours:  Shaking chills.  You have a temperature by mouth above 102 F (38.9 C), not controlled by medicine.  Chest, back, or muscle pain.  People around you feel you are not acting correctly or are confused.  Shortness of breath or difficulty breathing.  Dizziness and fainting.  You get a rash or develop hives.  You have a decrease in urine output.  Your urine turns a dark color or changes to pink, red, or brown. Any of the following symptoms occur over the next 10 days:  You have a temperature by mouth above 102 F (38.9 C), not controlled by medicine.  Shortness of breath.  Weakness after normal activity.  The white part of the eye turns yellow (jaundice).  You have a decrease in the amount of urine or are urinating less often.  Your urine turns a dark color or changes to pink, red, or brown. Document Released: 01/13/2000 Document Revised: 04/09/2011 Document Reviewed: 09/01/2007 [facility] Patient Information 2014 [place], [place].  _______________________________________________________________________

## 2015-03-10 ENCOUNTER — Telehealth: Payer: Self-pay | Admitting: *Deleted

## 2015-03-10 NOTE — Telephone Encounter (Signed)
F/u  Pt returning RN phone call- offered appt w/ Dr Mare Ferrari for tomorrow 2/10- pt refused. Please call back and discuss.

## 2015-03-10 NOTE — Telephone Encounter (Signed)
Scheduled appointment for patient to be seen tomorrow.  

## 2015-03-10 NOTE — Progress Notes (Addendum)
03-10-15 0935 Pt. Unable to obtain clean catch urine specimen after waiting 2 hours post PAT appointment, and drinking multiple cups of water-had another MD appointment. Pt. Asked to return to PAT department today for collection of urine specimen. Pt. Brought in urine specimen collected from home-this specimen was discarded, will need recollection AM of surgery preop- either voided specimen or catheter specimen. Pt. Left before I could tell her specimen provided-could not be used. 03-10-15 1030 Positive Staph aureus By PCR- will get treatment on arrival to Short Stay AM of with Betadine ointment.[name]

## 2015-03-10 NOTE — Telephone Encounter (Signed)
Dr. Mare Ferrari received a surgical clearance from [facility] patient yesterday and today to schedule an appointment, left message to call back both days

## 2015-03-11 ENCOUNTER — Encounter: Payer: Self-pay | Admitting: Cardiology

## 2015-03-11 ENCOUNTER — Ambulatory Visit (INDEPENDENT_AMBULATORY_CARE_PROVIDER_SITE_OTHER): Payer: Medicare Other | Admitting: Cardiology

## 2015-03-11 VITALS — BP 116/66 | HR 61 | Ht 60.5 in | Wt 187.8 lb

## 2015-03-11 DIAGNOSIS — I447 Left bundle-branch block, unspecified: Secondary | ICD-10-CM | POA: Diagnosis not present

## 2015-03-11 DIAGNOSIS — I10 Essential (primary) hypertension: Secondary | ICD-10-CM | POA: Diagnosis not present

## 2015-03-11 DIAGNOSIS — R0600 Dyspnea, unspecified: Secondary | ICD-10-CM

## 2015-03-11 DIAGNOSIS — I519 Heart disease, unspecified: Secondary | ICD-10-CM

## 2015-03-11 DIAGNOSIS — I5189 Other ill-defined heart diseases: Secondary | ICD-10-CM

## 2015-03-11 DIAGNOSIS — R0609 Other forms of dyspnea: Secondary | ICD-10-CM | POA: Diagnosis not present

## 2015-03-11 LAB — URINALYSIS, ROUTINE W REFLEX MICROSCOPIC
Bilirubin Urine: NEGATIVE
Glucose, UA: NEGATIVE mg/dL
Hgb urine dipstick: NEGATIVE
Ketones, ur: NEGATIVE mg/dL
Nitrite: NEGATIVE
Protein, ur: NEGATIVE mg/dL
Specific Gravity, Urine: 1.008 (ref 1.005–1.030)
pH: 7 (ref 5.0–8.0)

## 2015-03-11 LAB — URINE MICROSCOPIC-ADD ON

## 2015-03-11 NOTE — Progress Notes (Signed)
03-11-15 1345 Pt was able to submit a Clean catch urine sample today preop-results pending. W.Lindalou Soltis,RN

## 2015-03-11 NOTE — Patient Instructions (Signed)
Medication Instructions Your physician recommends that you continue on your current medications as directed. Please refer to the Current Medication list given to you today.  Labwork: NONE  Testing/Procedures: NONE  Follow-Up: AS NEEDED   If you need a refill on your cardiac medications before your next appointment, please call your pharmacy.

## 2015-03-11 NOTE — Progress Notes (Signed)
03-11-15 1515 Urinalysis results viewable in Epic-please note-specimen collected today.[name]

## 2015-03-11 NOTE — Progress Notes (Signed)
Cardiology Office Note   Date:  03/11/2015   ID:  Muskaan Wammack, DOB 05-27-1935, MRN [id number]  PCP:  Criselda Peaches, MD  Cardiologist: [name] MD  No chief complaint on file.     History of Present Illness: [name] is a 80 y.o. female who presents for Preoperative cardiology clearance.  She is scheduled for right knee replacement on Monday February 13. She has a history of known left bundle branch block. We initially saw her on 08/05/13 at which point she was complaining of shortness of breath and she had had an EKG done on 07/19/13 which showed a new left bundle branch block. She had been experiencing some peripheral edema. Her family history reveals her father had a heart attack in his 67s but lived until age 35. He died of COPD. The patient's mother died of cancer. The patient has a past history of hypercholesterolemia and essential hypertension. The patient had an echocardiogram on 08/27/13 which showed normal left ventricular systolic function with an ejection fraction of 55-60% and there was grade 1 diastolic dysfunction. On 08/28/13 she underwent an adenosine Myoview stress test which showed no ischemia and her ejection fraction was 60% She has been feeling well since last visit.  She had a complete physical with her PCP Dr. Conni Slipper last month and everything checked out fine.  She has lost 7 pounds since we last saw her here in our office.  She has not been experiencing any chest pain or shortness of breath.  She has had ongoing pain in her right knee which led to plans for surgery.  Past Medical History  Diagnosis Date  . Hypertension   . Arthritis   . Osteoporosis   . Headache(784.0)   . Depression   . Glaucoma   . Degenerative arthritis of hip     s/p THR 05/2011  . OAB (overactive bladder)     Past Surgical History  Procedure Laterality Date  . Abdominal hysterectomy  2003  . Breast cysts      X2 BENIGN  .  cataracts removed  2013 L  . Total  hip arthroplasty  06/01/2011    Procedure: TOTAL HIP ARTHROPLASTY ANTERIOR APPROACH;  Surgeon: Mcarthur Rossetti, MD;  Location: [facility] ORS;  Service: Orthopedics;  Laterality: Left;  Left Total Hip Replacement, Direct Anterior Approach  . Breast surgery    . Breast biopsy  1960  . Glaucoma valve insertion Bilateral 2013, 2014    bond ([facility])     Current Outpatient Prescriptions  Medication Sig Dispense Refill  . buPROPion (WELLBUTRIN SR) 150 MG 12 hr tablet take 1 tablet by mouth twice a day 180 tablet 2  . buPROPion (WELLBUTRIN SR) 200 MG 12 hr tablet Take 200 mg by mouth 2 (two) times daily.  1  . carvedilol (COREG) 12.5 MG tablet take 1 tablet by mouth twice a day 180 tablet 1  . colestipol (COLESTID) 1 G tablet Take 2 tablets (2 g total) by mouth 2 (two) times daily.    . furosemide (LASIX) 40 MG tablet take 1 tablet by mouth once daily 90 tablet 1  . lisinopril (PRINIVIL,ZESTRIL) 30 MG tablet take 1 tablet by mouth once daily 90 tablet 1  . Multiple Vitamin (MULITIVITAMIN WITH MINERALS) TABS Take 1 tablet by mouth daily.    . naproxen sodium (ANAPROX) 220 MG tablet Take 440 mg by mouth 2 (two) times daily as needed (pain).    . RESTASIS 0.05 % ophthalmic emulsion  Place 1 drop into both eyes daily.  1  . timolol (TIMOPTIC) 0.5 % ophthalmic solution Place 1 drop into both eyes 2 (two) times daily.  0  . traMADol (ULTRAM) 50 MG tablet take 1 tablet by mouth twice a day 60 tablet 5   No current facility-administered medications for this visit.    Allergies:   Review of patient's allergies indicates no known allergies.    Social History:  The patient  reports that she quit smoking about 34 years ago. She has never used smokeless tobacco. She reports that she does not drink alcohol or use illicit drugs.   Family History:  The patient's family history includes Alcohol abuse in her father; Ovarian cancer in her mother; Stroke in her brother.    ROS:  Please see the history of present  illness.   Otherwise, review of systems are positive for none.   All other systems are reviewed and negative.    PHYSICAL EXAM: VS:  BP 116/66 mmHg  Pulse 61  Ht 5' 0.5" (1.537 m)  Wt 187 lb 12.8 oz (85.186 kg)  BMI 36.06 kg/m2  SpO2 98% , BMI Body mass index is 36.06 kg/(m^2). GEN: Well nourished, well developed, in no acute distress HEENT: normal Neck: no JVD, carotid bruits, or masses Cardiac: RRR; no murmurs, rubs, or gallops,There is trace edema  Respiratory:  clear to auscultation bilaterally, normal work of breathing GI: soft, nontender, nondistended, + BS MS: no deformity or atrophy Skin: warm and dry, no rash Neuro:  Strength and sensation are intact Psych: euthymic mood, full affect   EKG:  EKG is not ordered today.    Recent Labs: 03/09/2015: ALT 22; BUN 21*; Creatinine, Ser 1.13*; Hemoglobin 13.7; Platelets 211; Potassium 4.1; Sodium 137    Lipid Panel    Component Value Date/Time   CHOL 163 07/10/2013 1229   TRIG 87.0 07/10/2013 1229   HDL 56.70 07/10/2013 1229   CHOLHDL 3 07/10/2013 1229   VLDL 17.4 07/10/2013 1229   LDLCALC 89 07/10/2013 1229      Wt Readings from Last 3 Encounters:  03/11/15 187 lb 12.8 oz (85.186 kg)  03/09/15 185 lb (83.915 kg)  03/18/14 194 lb 6.4 oz (88.179 kg)        ASSESSMENT AND PLAN:  1. hypertensive heart disease. Blood pressure is slightly high today but she is fluid overloaded secondary to not taking her Lasix as prescribed. 2. diastolic dysfunction contributing to her exertional dyspnea, worse since she has not been taking her Lasix 3. left bundle branch block 4. peripheral edema   Current medicines are reviewed at length with the patient today.  The patient does not have concerns regarding medicines.  The following changes have been made:  no change  Labs/ tests ordered today include:  No orders of the defined types were placed in this encounter.     Disposition:   The patient is doing well.  She is  cleared for orthopedic surgery from a cardiology standpoint and we filled out those forms today.  She will continue current medication.  She will continue close follow-up with her PCP and she will return to cardiology on a as-needed basis following my retirement.  Berna Spare MD 03/11/2015 11:02 AM    [facility] [address], [place], [place]  [address] Phone: [phone]; Fax: [phone]

## 2015-03-14 ENCOUNTER — Encounter (HOSPITAL_COMMUNITY): Payer: Self-pay | Admitting: *Deleted

## 2015-03-14 ENCOUNTER — Inpatient Hospital Stay (HOSPITAL_COMMUNITY)
Admission: RE | Admit: 2015-03-14 | Discharge: 2015-03-17 | DRG: 470 | Disposition: A | Discharge status: Skilled Nursing Facility | Payer: Medicare Other | Source: Ambulatory Visit | Attending: Orthopedic Surgery | Admitting: Orthopedic Surgery

## 2015-03-14 ENCOUNTER — Inpatient Hospital Stay (HOSPITAL_COMMUNITY): Payer: Medicare Other | Admitting: Anesthesiology

## 2015-03-14 ENCOUNTER — Encounter (HOSPITAL_COMMUNITY)
Admission: RE | Disposition: A | Discharge status: Skilled Nursing Facility | Payer: Self-pay | Source: Ambulatory Visit | Attending: Orthopedic Surgery

## 2015-03-14 DIAGNOSIS — M6281 Muscle weakness (generalized): Secondary | ICD-10-CM | POA: Diagnosis not present

## 2015-03-14 DIAGNOSIS — I447 Left bundle-branch block, unspecified: Secondary | ICD-10-CM | POA: Diagnosis not present

## 2015-03-14 DIAGNOSIS — I5030 Unspecified diastolic (congestive) heart failure: Secondary | ICD-10-CM | POA: Diagnosis not present

## 2015-03-14 DIAGNOSIS — F419 Anxiety disorder, unspecified: Secondary | ICD-10-CM | POA: Diagnosis present

## 2015-03-14 DIAGNOSIS — Z87891 Personal history of nicotine dependence: Secondary | ICD-10-CM

## 2015-03-14 DIAGNOSIS — Z7982 Long term (current) use of aspirin: Secondary | ICD-10-CM | POA: Diagnosis not present

## 2015-03-14 DIAGNOSIS — Z96651 Presence of right artificial knee joint: Secondary | ICD-10-CM | POA: Diagnosis not present

## 2015-03-14 DIAGNOSIS — F329 Major depressive disorder, single episode, unspecified: Secondary | ICD-10-CM | POA: Diagnosis present

## 2015-03-14 DIAGNOSIS — M25569 Pain in unspecified knee: Secondary | ICD-10-CM | POA: Diagnosis not present

## 2015-03-14 DIAGNOSIS — M1710 Unilateral primary osteoarthritis, unspecified knee: Secondary | ICD-10-CM | POA: Diagnosis present

## 2015-03-14 DIAGNOSIS — G8929 Other chronic pain: Secondary | ICD-10-CM | POA: Diagnosis present

## 2015-03-14 DIAGNOSIS — Z79899 Other long term (current) drug therapy: Secondary | ICD-10-CM | POA: Diagnosis not present

## 2015-03-14 DIAGNOSIS — Z01812 Encounter for preprocedural laboratory examination: Secondary | ICD-10-CM | POA: Diagnosis not present

## 2015-03-14 DIAGNOSIS — M810 Age-related osteoporosis without current pathological fracture: Secondary | ICD-10-CM | POA: Diagnosis present

## 2015-03-14 DIAGNOSIS — I10 Essential (primary) hypertension: Secondary | ICD-10-CM | POA: Diagnosis not present

## 2015-03-14 DIAGNOSIS — M1711 Unilateral primary osteoarthritis, right knee: Secondary | ICD-10-CM

## 2015-03-14 DIAGNOSIS — R2681 Unsteadiness on feet: Secondary | ICD-10-CM | POA: Diagnosis not present

## 2015-03-14 DIAGNOSIS — M179 Osteoarthritis of knee, unspecified: Secondary | ICD-10-CM | POA: Diagnosis present

## 2015-03-14 DIAGNOSIS — M25561 Pain in right knee: Secondary | ICD-10-CM | POA: Diagnosis not present

## 2015-03-14 DIAGNOSIS — H409 Unspecified glaucoma: Secondary | ICD-10-CM | POA: Diagnosis present

## 2015-03-14 DIAGNOSIS — Z96649 Presence of unspecified artificial hip joint: Secondary | ICD-10-CM | POA: Diagnosis present

## 2015-03-14 DIAGNOSIS — R278 Other lack of coordination: Secondary | ICD-10-CM | POA: Diagnosis not present

## 2015-03-14 DIAGNOSIS — R262 Difficulty in walking, not elsewhere classified: Secondary | ICD-10-CM | POA: Diagnosis not present

## 2015-03-14 DIAGNOSIS — Z4789 Encounter for other orthopedic aftercare: Secondary | ICD-10-CM | POA: Diagnosis not present

## 2015-03-14 DIAGNOSIS — M1991 Primary osteoarthritis, unspecified site: Secondary | ICD-10-CM | POA: Diagnosis not present

## 2015-03-14 HISTORY — PX: TOTAL KNEE ARTHROPLASTY: SHX125

## 2015-03-14 LAB — TYPE AND SCREEN
ABO/RH(D): AB POS
Antibody Screen: NEGATIVE

## 2015-03-14 SURGERY — ARTHROPLASTY, KNEE, TOTAL
Anesthesia: Spinal | Site: Knee | Laterality: Right

## 2015-03-14 MED ORDER — ONDANSETRON HCL 4 MG/2ML IJ SOLN: 4.0000 mg | Freq: Four times a day (QID) | INTRAMUSCULAR | Status: DC | PRN

## 2015-03-14 MED ORDER — OXYCODONE HCL 5 MG PO TABS
5.0000 mg | ORAL_TABLET | ORAL | Status: DC | PRN
  Administered 2015-03-14: 5 mg via ORAL
  Administered 2015-03-14: 10 mg via ORAL
  Administered 2015-03-15: 5 mg via ORAL
  Administered 2015-03-15 (×3): 10 mg via ORAL
  Administered 2015-03-16 (×2): 5 mg via ORAL
  Administered 2015-03-16 (×2): 10 mg via ORAL
  Administered 2015-03-16 – 2015-03-17 (×2): 5 mg via ORAL
  Filled 2015-03-14 (×2): qty 2
  Filled 2015-03-14: qty 1
  Filled 2015-03-14: qty 2
  Filled 2015-03-14: qty 1
  Filled 2015-03-14: qty 2
  Filled 2015-03-14: qty 1
  Filled 2015-03-14: qty 2
  Filled 2015-03-14: qty 1
  Filled 2015-03-14: qty 2
  Filled 2015-03-14 (×2): qty 1

## 2015-03-14 MED ORDER — BUPIVACAINE IN DEXTROSE 0.75-8.25 % IT SOLN
INTRATHECAL | Status: DC | PRN
  Administered 2015-03-14: 1.8 mL via INTRATHECAL

## 2015-03-14 MED ORDER — BUPROPION HCL ER (SR) 150 MG PO TB12: 150.0000 mg | ORAL_TABLET | Freq: Two times a day (BID) | ORAL | Status: DC

## 2015-03-14 MED ORDER — DEXAMETHASONE SODIUM PHOSPHATE 10 MG/ML IJ SOLN
10.0000 mg | Freq: Once | INTRAMUSCULAR | Status: AC
  Administered 2015-03-15: 10 mg via INTRAVENOUS
  Filled 2015-03-14: qty 1

## 2015-03-14 MED ORDER — POLYETHYLENE GLYCOL 3350 17 G PO PACK
17.0000 g | PACK | Freq: Every day | ORAL | Status: DC | PRN
  Administered 2015-03-16: 17 g via ORAL
  Filled 2015-03-14: qty 1

## 2015-03-14 MED ORDER — PHENYLEPHRINE HCL 10 MG/ML IJ SOLN
10.0000 mg | INTRAVENOUS | Status: DC | PRN
  Administered 2015-03-14: 40 ug/min via INTRAVENOUS

## 2015-03-14 MED ORDER — ACETAMINOPHEN 10 MG/ML IV SOLN
INTRAVENOUS | Status: AC
  Filled 2015-03-14: qty 100

## 2015-03-14 MED ORDER — CEFAZOLIN SODIUM-DEXTROSE 2-3 GM-% IV SOLR
2.0000 g | INTRAVENOUS | Status: AC
  Administered 2015-03-14: 2 g via INTRAVENOUS

## 2015-03-14 MED ORDER — TIMOLOL MALEATE 0.5 % OP SOLN
1.0000 [drp] | Freq: Two times a day (BID) | OPHTHALMIC | Status: DC
  Administered 2015-03-14 – 2015-03-17 (×6): 1 [drp] via OPHTHALMIC
  Filled 2015-03-14: qty 5

## 2015-03-14 MED ORDER — BISACODYL 10 MG RE SUPP: 10.0000 mg | Freq: Every day | RECTAL | Status: DC | PRN

## 2015-03-14 MED ORDER — CHLORHEXIDINE GLUCONATE 4 % EX LIQD: 60.0000 mL | Freq: Once | CUTANEOUS | Status: DC

## 2015-03-14 MED ORDER — CEFAZOLIN SODIUM-DEXTROSE 2-3 GM-% IV SOLR
2.0000 g | Freq: Four times a day (QID) | INTRAVENOUS | Status: AC
  Administered 2015-03-14 – 2015-03-15 (×2): 2 g via INTRAVENOUS
  Filled 2015-03-14 (×2): qty 50

## 2015-03-14 MED ORDER — ONDANSETRON HCL 4 MG PO TABS: 4.0000 mg | ORAL_TABLET | Freq: Four times a day (QID) | ORAL | Status: DC | PRN

## 2015-03-14 MED ORDER — TRANEXAMIC ACID 1000 MG/10ML IV SOLN
1000.0000 mg | Freq: Once | INTRAVENOUS | Status: AC
  Administered 2015-03-14: 1000 mg via INTRAVENOUS
  Filled 2015-03-14: qty 10

## 2015-03-14 MED ORDER — SODIUM CHLORIDE 0.9 % IV SOLN
INTRAVENOUS | Status: DC
  Administered 2015-03-14: 18:00:00 via INTRAVENOUS

## 2015-03-14 MED ORDER — DEXTROSE 5 % IV SOLN
500.0000 mg | Freq: Four times a day (QID) | INTRAVENOUS | Status: DC | PRN
  Administered 2015-03-14: 500 mg via INTRAVENOUS
  Filled 2015-03-14 (×2): qty 5

## 2015-03-14 MED ORDER — ACETAMINOPHEN 10 MG/ML IV SOLN
1000.0000 mg | Freq: Once | INTRAVENOUS | Status: AC
  Administered 2015-03-14: 1000 mg via INTRAVENOUS

## 2015-03-14 MED ORDER — HYDROMORPHONE HCL 1 MG/ML IJ SOLN: 0.2500 mg | INTRAMUSCULAR | Status: DC | PRN

## 2015-03-14 MED ORDER — BUPIVACAINE LIPOSOME 1.3 % IJ SUSP
INTRAMUSCULAR | Status: DC | PRN
  Administered 2015-03-14: 20 mL

## 2015-03-14 MED ORDER — LACTATED RINGERS IV SOLN: INTRAVENOUS | Status: DC

## 2015-03-14 MED ORDER — PROPOFOL 10 MG/ML IV BOLUS
INTRAVENOUS | Status: AC
  Filled 2015-03-14: qty 60

## 2015-03-14 MED ORDER — LACTATED RINGERS IV SOLN
INTRAVENOUS | Status: DC | PRN
  Administered 2015-03-14 (×2): via INTRAVENOUS

## 2015-03-14 MED ORDER — METHOCARBAMOL 500 MG PO TABS
500.0000 mg | ORAL_TABLET | Freq: Four times a day (QID) | ORAL | Status: DC | PRN
  Administered 2015-03-15 – 2015-03-16 (×7): 500 mg via ORAL
  Filled 2015-03-14 (×7): qty 1

## 2015-03-14 MED ORDER — PHENYLEPHRINE HCL 10 MG/ML IJ SOLN
INTRAMUSCULAR | Status: AC
  Filled 2015-03-14: qty 2

## 2015-03-14 MED ORDER — SODIUM CHLORIDE 0.9 % IV SOLN
1000.0000 mg | INTRAVENOUS | Status: AC
  Administered 2015-03-14: 1000 mg via INTRAVENOUS
  Filled 2015-03-14: qty 10

## 2015-03-14 MED ORDER — DEXAMETHASONE SODIUM PHOSPHATE 10 MG/ML IJ SOLN
INTRAMUSCULAR | Status: AC
  Filled 2015-03-14: qty 1

## 2015-03-14 MED ORDER — SODIUM CHLORIDE 0.9 % IJ SOLN
INTRAMUSCULAR | Status: AC
  Filled 2015-03-14: qty 50

## 2015-03-14 MED ORDER — DIPHENHYDRAMINE HCL 12.5 MG/5ML PO ELIX: 12.5000 mg | ORAL_SOLUTION | ORAL | Status: DC | PRN

## 2015-03-14 MED ORDER — LACTATED RINGERS IV SOLN
INTRAVENOUS | Status: DC
  Administered 2015-03-14: 1000 mL via INTRAVENOUS

## 2015-03-14 MED ORDER — SODIUM CHLORIDE 0.9 % IJ SOLN
INTRAMUSCULAR | Status: DC | PRN
  Administered 2015-03-14: 30 mL

## 2015-03-14 MED ORDER — METOCLOPRAMIDE HCL 10 MG PO TABS: 5.0000 mg | ORAL_TABLET | Freq: Three times a day (TID) | ORAL | Status: DC | PRN

## 2015-03-14 MED ORDER — FUROSEMIDE 40 MG PO TABS
40.0000 mg | ORAL_TABLET | Freq: Every day | ORAL | Status: DC
  Administered 2015-03-15 – 2015-03-16 (×2): 40 mg via ORAL
  Filled 2015-03-14 (×3): qty 1

## 2015-03-14 MED ORDER — METOCLOPRAMIDE HCL 5 MG/ML IJ SOLN: 5.0000 mg | Freq: Three times a day (TID) | INTRAMUSCULAR | Status: DC | PRN

## 2015-03-14 MED ORDER — RIVAROXABAN 10 MG PO TABS
10.0000 mg | ORAL_TABLET | Freq: Every day | ORAL | Status: DC
  Administered 2015-03-15 – 2015-03-17 (×3): 10 mg via ORAL
  Filled 2015-03-14 (×4): qty 1

## 2015-03-14 MED ORDER — BUPROPION HCL ER (SR) 100 MG PO TB12
200.0000 mg | ORAL_TABLET | Freq: Two times a day (BID) | ORAL | Status: DC
  Administered 2015-03-14 – 2015-03-17 (×6): 200 mg via ORAL
  Filled 2015-03-14 (×7): qty 2

## 2015-03-14 MED ORDER — SODIUM CHLORIDE 0.9 % IR SOLN
Status: DC | PRN
  Administered 2015-03-14: 1000 mL

## 2015-03-14 MED ORDER — MENTHOL 3 MG MT LOZG: 1.0000 | LOZENGE | OROMUCOSAL | Status: DC | PRN

## 2015-03-14 MED ORDER — BUPIVACAINE HCL 0.25 % IJ SOLN
INTRAMUSCULAR | Status: DC | PRN
  Administered 2015-03-14: 20 mL

## 2015-03-14 MED ORDER — ACETAMINOPHEN 500 MG PO TABS
1000.0000 mg | ORAL_TABLET | Freq: Four times a day (QID) | ORAL | Status: AC
  Administered 2015-03-14 – 2015-03-15 (×4): 1000 mg via ORAL
  Filled 2015-03-14 (×5): qty 2

## 2015-03-14 MED ORDER — DEXAMETHASONE SODIUM PHOSPHATE 10 MG/ML IJ SOLN
10.0000 mg | Freq: Once | INTRAMUSCULAR | Status: AC
  Administered 2015-03-14: 10 mg via INTRAVENOUS

## 2015-03-14 MED ORDER — CEFAZOLIN SODIUM-DEXTROSE 2-3 GM-% IV SOLR
INTRAVENOUS | Status: AC
  Filled 2015-03-14: qty 50

## 2015-03-14 MED ORDER — BUPIVACAINE LIPOSOME 1.3 % IJ SUSP
20.0000 mL | Freq: Once | INTRAMUSCULAR | Status: DC
  Filled 2015-03-14: qty 20

## 2015-03-14 MED ORDER — FLEET ENEMA 7-19 GM/118ML RE ENEM: 1.0000 | ENEMA | Freq: Once | RECTAL | Status: DC | PRN

## 2015-03-14 MED ORDER — CARVEDILOL 12.5 MG PO TABS
12.5000 mg | ORAL_TABLET | Freq: Two times a day (BID) | ORAL | Status: DC
  Administered 2015-03-14 – 2015-03-17 (×6): 12.5 mg via ORAL
  Filled 2015-03-14 (×7): qty 1

## 2015-03-14 MED ORDER — FENTANYL CITRATE (PF) 100 MCG/2ML IJ SOLN
INTRAMUSCULAR | Status: DC | PRN
  Administered 2015-03-14: 50 ug via INTRAVENOUS

## 2015-03-14 MED ORDER — PHENOL 1.4 % MT LIQD
1.0000 | OROMUCOSAL | Status: DC | PRN
  Filled 2015-03-14: qty 177

## 2015-03-14 MED ORDER — ACETAMINOPHEN 650 MG RE SUPP: 650.0000 mg | Freq: Four times a day (QID) | RECTAL | Status: DC | PRN

## 2015-03-14 MED ORDER — ACETAMINOPHEN 325 MG PO TABS: 650.0000 mg | ORAL_TABLET | Freq: Four times a day (QID) | ORAL | Status: DC | PRN

## 2015-03-14 MED ORDER — PROPOFOL 500 MG/50ML IV EMUL
INTRAVENOUS | Status: DC | PRN
  Administered 2015-03-14: 50 ug/kg/min via INTRAVENOUS

## 2015-03-14 MED ORDER — DOCUSATE SODIUM 100 MG PO CAPS
100.0000 mg | ORAL_CAPSULE | Freq: Two times a day (BID) | ORAL | Status: DC
  Administered 2015-03-14 – 2015-03-17 (×6): 100 mg via ORAL

## 2015-03-14 MED ORDER — TRAMADOL HCL 50 MG PO TABS: 50.0000 mg | ORAL_TABLET | Freq: Four times a day (QID) | ORAL | Status: DC | PRN

## 2015-03-14 MED ORDER — LACTATED RINGERS IV SOLN
INTRAVENOUS | Status: DC
Start: 2015-03-14 — End: 2015-03-14

## 2015-03-14 MED ORDER — CYCLOSPORINE 0.05 % OP EMUL
1.0000 [drp] | Freq: Two times a day (BID) | OPHTHALMIC | Status: DC
  Administered 2015-03-14 – 2015-03-17 (×4): 1 [drp] via OPHTHALMIC
  Filled 2015-03-14 (×7): qty 1

## 2015-03-14 MED ORDER — MORPHINE SULFATE (PF) 2 MG/ML IV SOLN
1.0000 mg | INTRAVENOUS | Status: DC | PRN
  Administered 2015-03-14: 1 mg via INTRAVENOUS
  Filled 2015-03-14: qty 1

## 2015-03-14 MED ORDER — PROPOFOL 10 MG/ML IV BOLUS
INTRAVENOUS | Status: DC | PRN
  Administered 2015-03-14: 20 mg via INTRAVENOUS

## 2015-03-14 MED ORDER — BUPIVACAINE HCL (PF) 0.25 % IJ SOLN
INTRAMUSCULAR | Status: AC
  Filled 2015-03-14: qty 30

## 2015-03-14 MED ORDER — FENTANYL CITRATE (PF) 100 MCG/2ML IJ SOLN
INTRAMUSCULAR | Status: AC
Start: 2015-03-14 — End: 2015-03-14
  Filled 2015-03-14: qty 2

## 2015-03-14 MED ORDER — SODIUM CHLORIDE 0.9 % IV SOLN: INTRAVENOUS | Status: DC

## 2015-03-14 SURGICAL SUPPLY — 50 items
BAG DECANTER FOR FLEXI CONT (MISCELLANEOUS) ×2 IMPLANT
BAG ZIPLOCK 12X15 (MISCELLANEOUS) ×2 IMPLANT
BANDAGE ACE 6X5 VEL STRL LF (GAUZE/BANDAGES/DRESSINGS) IMPLANT
BANDAGE ELASTIC 6 VELCRO ST LF (GAUZE/BANDAGES/DRESSINGS) ×2 IMPLANT
BLADE SAG 18X100X1.27 (BLADE) ×2 IMPLANT
BLADE SAW SGTL 11.0X1.19X90.0M (BLADE) ×2 IMPLANT
BOWL SMART MIX CTS (DISPOSABLE) ×2 IMPLANT
CAP KNEE TOTAL 3 SIGMA ×2 IMPLANT
CEMENT HV SMART SET (Cement) ×4 IMPLANT
CLOTH BEACON ORANGE TIMEOUT ST (SAFETY) ×2 IMPLANT
CUFF TOURN SGL QUICK 34 (TOURNIQUET CUFF) ×1
CUFF TRNQT CYL 34X4X40X1 (TOURNIQUET CUFF) ×1 IMPLANT
DECANTER SPIKE VIAL GLASS SM (MISCELLANEOUS) IMPLANT
DRAPE U-SHAPE 47X51 STRL (DRAPES) ×2 IMPLANT
DRSG ADAPTIC 3X8 NADH LF (GAUZE/BANDAGES/DRESSINGS) ×2 IMPLANT
DRSG PAD ABDOMINAL 8X10 ST (GAUZE/BANDAGES/DRESSINGS) IMPLANT
DURAPREP 26ML APPLICATOR (WOUND CARE) ×2 IMPLANT
ELECT REM PT RETURN 9FT ADLT (ELECTROSURGICAL) ×2
ELECTRODE REM PT RTRN 9FT ADLT (ELECTROSURGICAL) ×1 IMPLANT
EVACUATOR 1/8 PVC DRAIN (DRAIN) ×2 IMPLANT
GAUZE SPONGE 4X4 12PLY STRL (GAUZE/BANDAGES/DRESSINGS) ×2 IMPLANT
GLOVE BIO SURGEON STRL SZ7.5 (GLOVE) IMPLANT
GLOVE BIO SURGEON STRL SZ8 (GLOVE) ×2 IMPLANT
GLOVE BIOGEL PI IND STRL 6.5 (GLOVE) IMPLANT
GLOVE BIOGEL PI IND STRL 8 (GLOVE) ×1 IMPLANT
GLOVE BIOGEL PI INDICATOR 6.5 (GLOVE)
GLOVE BIOGEL PI INDICATOR 8 (GLOVE) ×1
GLOVE SURG SS PI 6.5 STRL IVOR (GLOVE) IMPLANT
GOWN STRL REUS W/TWL LRG LVL3 (GOWN DISPOSABLE) ×2 IMPLANT
GOWN STRL REUS W/TWL XL LVL3 (GOWN DISPOSABLE) IMPLANT
HANDPIECE INTERPULSE COAX TIP (DISPOSABLE) ×1
IMMOBILIZER KNEE 20 (SOFTGOODS) ×2 IMPLANT
IMMOBILIZER KNEE 20 THIGH 36 (SOFTGOODS) IMPLANT
MANIFOLD NEPTUNE II (INSTRUMENTS) ×2 IMPLANT
NS IRRIG 1000ML POUR BTL (IV SOLUTION) ×2 IMPLANT
PACK TOTAL KNEE CUSTOM (KITS) ×2 IMPLANT
PADDING CAST COTTON 6X4 STRL (CAST SUPPLIES) ×2 IMPLANT
POSITIONER SURGICAL ARM (MISCELLANEOUS) ×2 IMPLANT
SET HNDPC FAN SPRY TIP SCT (DISPOSABLE) ×1 IMPLANT
STRIP CLOSURE SKIN 1/2X4 (GAUZE/BANDAGES/DRESSINGS) ×2 IMPLANT
SUT MNCRL AB 4-0 PS2 18 (SUTURE) ×2 IMPLANT
SUT VIC AB 2-0 CT1 27 (SUTURE) ×3
SUT VIC AB 2-0 CT1 TAPERPNT 27 (SUTURE) ×3 IMPLANT
SUT VLOC 180 0 24IN GS25 (SUTURE) ×2 IMPLANT
SYR 50ML LL SCALE MARK (SYRINGE) ×2 IMPLANT
TRAY FOLEY W/METER SILVER 14FR (SET/KITS/TRAYS/PACK) ×2 IMPLANT
TRAY FOLEY W/METER SILVER 16FR (SET/KITS/TRAYS/PACK) ×2 IMPLANT
WATER STERILE IRR 1500ML POUR (IV SOLUTION) ×2 IMPLANT
WRAP KNEE MAXI GEL POST OP (GAUZE/BANDAGES/DRESSINGS) ×2 IMPLANT
YANKAUER SUCT BULB TIP 10FT TU (MISCELLANEOUS) ×2 IMPLANT

## 2015-03-14 NOTE — Transfer of Care (Signed)
Immediate Anesthesia Transfer of Care Note  Patient: [name]  Procedure(s) Performed: Procedure(s): TOTAL KNEE ARTHROPLASTY (Right)  Patient Location: PACU  Anesthesia Type:Spinal  Level of Consciousness:  sedated, patient cooperative and responds to stimulation  Airway & Oxygen Therapy:Patient Spontanous Breathing and Patient connected to face mask oxgen  Post-op Assessment:  Report given to PACU RN and Post -op Vital signs reviewed and stable  Post vital signs:  Reviewed and stable  Last Vitals:  Filed Vitals:   03/14/15 1122  BP: 130/71  Pulse: 64  Temp: 36.6 C  Resp: 16    Complications: No apparent anesthesia complications, [id number] level on exam, denied pain on assessment.

## 2015-03-14 NOTE — Anesthesia Preprocedure Evaluation (Addendum)
Anesthesia Evaluation  Patient identified by MRN, date of birth, ID band Patient awake    Reviewed: Allergy & Precautions, H&P , NPO status , Patient's Chart, lab work & pertinent test results  Airway Mallampati: II  TM Distance: >3 FB Neck ROM: Full    Dental  (+) Caps, Dental Advisory Given, Chipped All upper front are capped and left upper front one has a chip in it:   Pulmonary neg pulmonary ROS, former smoker,    Pulmonary exam normal breath sounds clear to auscultation       Cardiovascular hypertension, Pt. on medications and Pt. on home beta blockers Normal cardiovascular exam Rhythm:Regular Rate:Normal   Diastolic dysfunction   Neuro/Psych  Headaches, PSYCHIATRIC DISORDERS Depression Glaucoma. Back problems. Cervical spine problems negative psych ROS   GI/Hepatic negative GI ROS, Neg liver ROS,   Endo/Other  negative endocrine ROS  Renal/GU negative Renal ROS  negative genitourinary   Musculoskeletal negative musculoskeletal ROS (+)   Abdominal   Peds negative pediatric ROS (+)  Hematology negative hematology ROS (+)   Anesthesia Other Findings   Reproductive/Obstetrics negative OB ROS                           Anesthesia Physical Anesthesia Plan  ASA: III  Anesthesia Plan: Spinal   Post-op Pain Management:    Induction:   Airway Management Planned:   Additional Equipment:   Intra-op Plan:   Post-operative Plan:   Informed Consent:   Plan Discussed with:   Anesthesia Plan Comments:         Anesthesia Quick Evaluation

## 2015-03-14 NOTE — Anesthesia Procedure Notes (Addendum)
Spinal Patient location during procedure: OR Start time: 03/14/2015 12:51 PM End time: 03/14/2015 12:58 PM Reason for block: at surgeon's request Staffing Resident/CRNA: Anne Fu Performed by: resident/CRNA  Preanesthetic Checklist Completed: patient identified, site marked, surgical consent, pre-op evaluation, timeout performed, IV checked, risks and benefits discussed, monitors and equipment checked and at surgeon's request Spinal Block Patient position: sitting Prep: Betadine Approach: right paramedian Location: L2-3 Injection technique: single-shot Needle Needle type: Spinocan  Needle gauge: 22 G Needle length: 9 cm Assessment Sensory level: T6 Additional Notes Expiration date of kit checked and confirmed. Patient tolerated procedure well, without complications. X 1 attempt with noted clear CSF return. Loss of motor and sensory on exam post injection.

## 2015-03-14 NOTE — H&P (View-Only) (Signed)
[name] DOB: 1935-08-08 Divorced / Language: [name] / Race: White Female Date of Admission:  03/14/2015 CC:  Right Knee Pain History of Present Illness The patient is a 80 year old female who comes in for a preoperative History and Physical. The patient is scheduled for a right total knee arthroplasty to be performed by Dr. Dione Plover. Aluisio, MD at [facility] on 03-14-2015. The patient reports right knee symptoms including: pain which began 1 year(s) ago without any known injury. Prior to being seen, the patient was previously evaluated by Dr. Rush Farmer. Past treatment for this problem has included intra-articular injection of corticosteroids (2x, aspiration/cortisone injection). Symptoms are reported to be located in the right knee and include knee pain (that is getting progressively worse), swelling, stiffness and instability (occasionally). She reports that she has been seen for this for the past year or two. She has been seeing Dr. Ninfa Linden. She also saw his PA. She reports that she has been given cortisone injections as well as had fluid aspirated from the knee. Most recent cortisone injection is about four months ago. She said that it helped her about a month. Most of her pain is associated with weightbearing, but she is getting some pain at rest. Occasionally, she has some left knee pain, but it is very insignificant compared to the right knee. She is not having groin pain. No numbness or tingling in the legs. No pain that radiates down from her back. No previous surgeries on the knees. No previous major injuries. Her radiographs are reviewed. She has bone on bone arthritis in the lateral compartments with patellofemoral arthritis also. The knee pain has gotten progressively worse over time. Cortisone and viscosupplements did not help. At this point, the most predictable means of improving her pain and functions may be total knee arthroplasty. She is at a stage where she would like to do  this. She is admitted for total knee replacement.  They have been treated conservatively in the past for the above stated problem and despite conservative measures, they continue to have progressive pain and severe functional limitations and dysfunction. They have failed non-operative management including home exercise, medications, and injections. It is felt that they would benefit from undergoing total joint replacement. Risks and benefits of the procedure have been discussed with the patient and they elect to proceed with surgery. There are no active contraindications to surgery such as ongoing infection or rapidly progressive neurological disease.  Problem List/Past Medical  Primary osteoarthritis of right knee (M17.11)  Left Bundle Branch Block  Overactive bladder (N32.81)  Peripheral Edema  Anxiety Disorder  Chronic Pain  Osteoarthritis  Osteoporosis  High blood pressure  Glaucoma  Cataract  Depression  Allergies  No Known Drug Allergies  Family History Hypertension  father and brother  Social History Alcohol use  current drinker; drinks wine; only occasionally per week Children  1 Current work status  retired Engineer, agricultural (Currently)  no Drug/Alcohol Rehab (Previously)  no Exercise  Exercises weekly; does running / walking Illicit drug use  no Living situation  live alone Marital status  divorced Number of flights of stairs before winded  1 Tobacco / smoke exposure  no Tobacco use  former smoker; smoke(d) 1 pack(s) per day  Medication History Lisinopril (30MG  Tablet, Oral) Active. Furosemide (40MG  Tablet, Oral) Active. Carvedilol (12.5MG  Tablet, Oral) Active. Restasis (0.05% Emulsion, Ophthalmic) Active. Timolol Maleate (0.5% Solution, Ophthalmic) Active. BuPROPion HCl ER (SR) (200MG  Tablet ER 12HR, Oral) Active. [employer] (  Oral) Active. Aspirin (81MG  Tablet Chewable, Oral) Active.  Past Surgical  History Hysterectomy  complete (non-cancerous) Cataract Surgery  left Breast Biopsy  left Mammoplasty; Reduction  bilateral Colectomy  complete   Review of Systems General Not Present- Chills, Fatigue, Fever, Memory Loss, Night Sweats, Weight Gain and Weight Loss. Skin Not Present- Eczema, Hives, Itching, Lesions and Rash. HEENT Not Present- Dentures, Double Vision, Headache, Hearing Loss, Tinnitus and Visual Loss. Respiratory Not Present- Allergies, Chronic Cough, Coughing up blood, Shortness of breath at rest and Shortness of breath with exertion. Cardiovascular Not Present- Chest Pain, Difficulty Breathing Lying Down, Murmur, Palpitations, Racing/skipping heartbeats and Swelling. Gastrointestinal Not Present- Abdominal Pain, Bloody Stool, Constipation, Diarrhea, Difficulty Swallowing, Heartburn, Jaundice, Loss of appetitie, Nausea and Vomiting. Female Genitourinary Not Present- Blood in Urine, Discharge, Flank Pain, Incontinence, Painful Urination, Urgency, Urinary frequency, Urinary Retention, Urinating at Night and Weak urinary stream. Musculoskeletal Present- Joint Pain. Not Present- Back Pain, Joint Swelling, Morning Stiffness, Muscle Pain, Muscle Weakness and Spasms. Neurological Not Present- Blackout spells, Difficulty with balance, Dizziness, Paralysis, Tremor and Weakness. Psychiatric Not Present- Insomnia.  Vitals Weight: 175 lb Height: 60in Weight was reported by patient. Height was reported by patient. Body Surface Area: 1.76 m Body Mass Index: 34.18 kg/m  BP: 126/62 (Sitting, Right Arm, Standard)  Physical Exam General Mental Status -Alert, cooperative and good historian. General Appearance-pleasant, Not in acute distress. Orientation-Oriented X3. Build & Nutrition-Well nourished and Well developed.  Head and Neck Head-normocephalic, atraumatic . Neck Global Assessment - supple, no bruit auscultated on the right, no bruit auscultated on the  left.  Eye Pupil - Bilateral-Regular and Round. Motion - Bilateral-EOMI.  Chest and Lung Exam Auscultation Breath sounds - clear at anterior chest wall and clear at posterior chest wall. Adventitious sounds - No Adventitious sounds.  Cardiovascular Auscultation Rhythm - Regular rate and rhythm. Heart Sounds - S1 WNL and S2 WNL. Murmurs & Other Heart Sounds - Auscultation of the heart reveals - No Murmurs.  Abdomen Palpation/Percussion Tenderness - Abdomen is non-tender to palpation. Rigidity (guarding) - Abdomen is soft. Auscultation Auscultation of the abdomen reveals - Bowel sounds normal.  Female Genitourinary Note: Not done, not pertinent to present illness   Musculoskeletal Note: On exam, she is alert and oriented, in no apparent distress. Her right knee shows no swelling. Range of motion of the right knee is about 5 to 125. She is very tender throughout the knee. There is no instability.  RADIOGRAPHS Her radiographs are reviewed. She has bone on bone arthritis in the lateral compartments with patellofemoral arthritis also.  Assessment & Plan  Primary osteoarthritis of right knee (M17.11)  Note:Surgical Plans: Right Total Knee Replacement  Disposition: Wants to look into [facility] (where she went following a hip replacement).  PCP: Dr. Nyoka Cowden - Patient has been seen preoperatively and felt to be stable for surgery. Patient given a verbal approval to undergo surgery.  IV TXA  Anesthesia Issues: None  Signed electronically by Joelene Millin, III PA-C

## 2015-03-14 NOTE — Progress Notes (Signed)
Utilization review completed.  

## 2015-03-14 NOTE — Interval H&P Note (Signed)
History and Physical Interval Note:  03/14/2015 12:13 PM  [name]  has presented today for surgery, with the diagnosis of OA RIGHT KNEE  The various methods of treatment have been discussed with the patient and family. After consideration of risks, benefits and other options for treatment, the patient has consented to  Procedure(s): TOTAL KNEE ARTHROPLASTY (Right) as a surgical intervention .  The patient's history has been reviewed, patient examined, no change in status, stable for surgery.  I have reviewed the patient's chart and labs.  Questions were answered to the patient's satisfaction.     Gearlean Alf

## 2015-03-14 NOTE — Op Note (Signed)
Pre-operative diagnosis- Osteoarthritis  Right knee(s)  Post-operative diagnosis- Osteoarthritis Right knee(s)  Procedure-  Right  Total Knee Arthroplasty  Surgeon- Dione Plover. Tevis Dunavan, MD  Assistant- Arlee Muslim, PA-C   Anesthesia-  Spinal  EBL-* No blood loss amount entered *   Drains Hemovac  Tourniquet time-  Total Tourniquet Time Documented: Thigh (Right) - 33 minutes Total: Thigh (Right) - 33 minutes     Complications- None  Condition-PACU - hemodynamically stable.   Brief Clinical Note  [name] is a 80 y.o. year old female with end stage OA of her right knee with progressively worsening pain and dysfunction. She has constant pain, with activity and at rest and significant functional deficits with difficulties even with ADLs. She has had extensive non-op management including analgesics, injections of cortisone and viscosupplements, and home exercise program, but remains in significant pain with significant dysfunction.Radiographs show bone on bone arthritis lateral and patellofemoral. She presents now for right Total Knee Arthroplasty.    Procedure in detail---   The patient is brought into the operating room and positioned supine on the operating table. After successful administration of  Spinal,   a tourniquet is placed high on the  Left thigh(s) and the lower extremity is prepped and draped in the usual sterile fashion. Time out is performed by the operating team and then the  Left lower extremity is wrapped in Esmarch, knee flexed and the tourniquet inflated to 300 mmHg.       A midline incision is made with a ten blade through the subcutaneous tissue to the level of the extensor mechanism. A fresh blade is used to make a medial parapatellar arthrotomy. Soft tissue over the proximal medial tibia is subperiosteally elevated to the joint line with a knife and into the semimembranosus bursa with a Cobb elevator. Soft tissue over the proximal lateral tibia is elevated with  attention being paid to avoiding the patellar tendon on the tibial tubercle. The patella is everted, knee flexed 90 degrees and the ACL and PCL are removed. Findings are bone on bone lateral and patellofemoral.        The drill is used to create a starting hole in the distal femur and the canal is thoroughly irrigated with sterile saline to remove the fatty contents. The 5 degree Left  valgus alignment guide is placed into the femoral canal and the distal femoral cutting block is pinned to remove 10 mm off the distal femur. Resection is made with an oscillating saw.      The tibia is subluxed forward and the menisci are removed. The extramedullary alignment guide is placed referencing proximally at the medial aspect of the tibial tubercle and distally along the second metatarsal axis and tibial crest. The block is pinned to remove 4mm off the more deficient lateral  side. Resection is made with an oscillating saw. Size 3is the most appropriate size for the tibia and the proximal tibia is prepared with the modular drill and keel punch for that size.      The femoral sizing guide is placed and size 3 is most appropriate. Rotation is marked off the epicondylar axis and confirmed by creating a rectangular flexion gap at 90 degrees. The size 3 cutting block is pinned in this rotation and the anterior, posterior and chamfer cuts are made with the oscillating saw. The intercondylar block is then placed and that cut is made.      Trial size 3 tibial component, trial size 3 posterior stabilized femur  and a 12.5  mm posterior stabilized rotating platform insert trial is placed. Full extension is achieved with excellent varus/valgus and anterior/posterior balance throughout full range of motion. The patella is everted and thickness measured to be 22  mm. Free hand resection is taken to 12 mm, a 35 template is placed, lug holes are drilled, trial patella is placed, and it tracks normally. Osteophytes are removed off the  posterior femur with the trial in place. All trials are removed and the cut bone surfaces prepared with pulsatile lavage. Cement is mixed and once ready for implantation, the size 3 tibial implant, size  3 posterior stabilized femoral component, and the size 35 patella are cemented in place and the patella is held with the clamp. The trial insert is placed and the knee held in full extension. The Exparel (20 ml mixed with 30 ml saline) and .25% Bupivicaine, are injected into the extensor mechanism, posterior capsule, medial and lateral gutters and subcutaneous tissues.  All extruded cement is removed and once the cement is hard the permanent 12.5 mm posterior stabilized rotating platform insert is placed into the tibial tray.      The wound is copiously irrigated with saline solution and the extensor mechanism closed over a hemovac drain with #1 V-loc suture. The tourniquet is released for a total tourniquet time of 33  minutes. Flexion against gravity is 140 degrees and the patella tracks normally. Subcutaneous tissue is closed with 2.0 vicryl and subcuticular with running 4.0 Monocryl. The incision is cleaned and dried and steri-strips and a bulky sterile dressing are applied. The limb is placed into a knee immobilizer and the patient is awakened and transported to recovery in stable condition.      Please note that a surgical assistant was a medical necessity for this procedure in order to perform it in a safe and expeditious manner. Surgical assistant was necessary to retract the ligaments and vital neurovascular structures to prevent injury to them and also necessary for proper positioning of the limb to allow for anatomic placement of the prosthesis.   Dione Plover Makylah Bossard, MD    03/14/2015, 1:55 PM

## 2015-03-14 NOTE — Anesthesia Postprocedure Evaluation (Signed)
Anesthesia Post Note  Patient: [name]  Procedure(s) Performed: Procedure(s) (LRB): TOTAL KNEE ARTHROPLASTY (Right)  Patient location during evaluation: PACU Anesthesia Type: Spinal Level of consciousness: awake and alert Pain management: pain level controlled Vital Signs Assessment: post-procedure vital signs reviewed and stable Respiratory status: spontaneous breathing, nonlabored ventilation, respiratory function stable and patient connected to nasal cannula oxygen Cardiovascular status: blood pressure returned to baseline and stable Postop Assessment: no signs of nausea or vomiting Anesthetic complications: no    Last Vitals:  Filed Vitals:   03/14/15 1428 03/14/15 1430  BP: 117/64 137/65  Pulse: 55 54  Temp: 36.3 C   Resp: 15 15    Last Pain:  Filed Vitals:   03/14/15 1504  PainSc: 0-No pain    LLE Motor Response: No movement due to regional block (03/14/15 1500) LLE Sensation: No sensation (absent) (03/14/15 1500) RLE Motor Response: No movement due to regional block (03/14/15 1500) RLE Sensation: No sensation (absent) (03/14/15 1500) L Sensory Level: T12-Inguinal (groin) region (03/14/15 1500) R Sensory Level: T12-Inguinal (groin) region (03/14/15 1500)  Bao Coreas L

## 2015-03-15 LAB — CBC
HCT: 35.4 % — ABNORMAL LOW (ref 36.0–46.0)
Hemoglobin: 12 g/dL (ref 12.0–15.0)
MCH: 32.2 pg (ref 26.0–34.0)
MCHC: 33.9 g/dL (ref 30.0–36.0)
MCV: 94.9 fL (ref 78.0–100.0)
Platelets: 171 10*3/uL (ref 150–400)
RBC: 3.73 MIL/uL — ABNORMAL LOW (ref 3.87–5.11)
RDW: 13.1 % (ref 11.5–15.5)
WBC: 7.2 10*3/uL (ref 4.0–10.5)

## 2015-03-15 LAB — BASIC METABOLIC PANEL
Anion gap: 8 (ref 5–15)
BUN: 20 mg/dL (ref 6–20)
CO2: 22 mmol/L (ref 22–32)
Calcium: 8.6 mg/dL — ABNORMAL LOW (ref 8.9–10.3)
Chloride: 106 mmol/L (ref 101–111)
Creatinine, Ser: 1 mg/dL (ref 0.44–1.00)
GFR calc Af Amer: >60 mL/min (ref >60)
GFR calc non Af Amer: 53 mL/min — ABNORMAL LOW (ref >60)
Glucose, Bld: 179 mg/dL — ABNORMAL HIGH (ref 65–99)
Potassium: 4.1 mmol/L (ref 3.5–5.1)
Sodium: 136 mmol/L (ref 135–145)

## 2015-03-15 IMAGING — CR DG CHEST 2V
2 series · 2 of 2 positions shown · non-contrast
Comparison: 05/28/2011

CLINICAL DATA: Dyspnea on exertion.  Short of breath.

EXAM:
CHEST  2 VIEW

[view not recorded (1 of 2)]
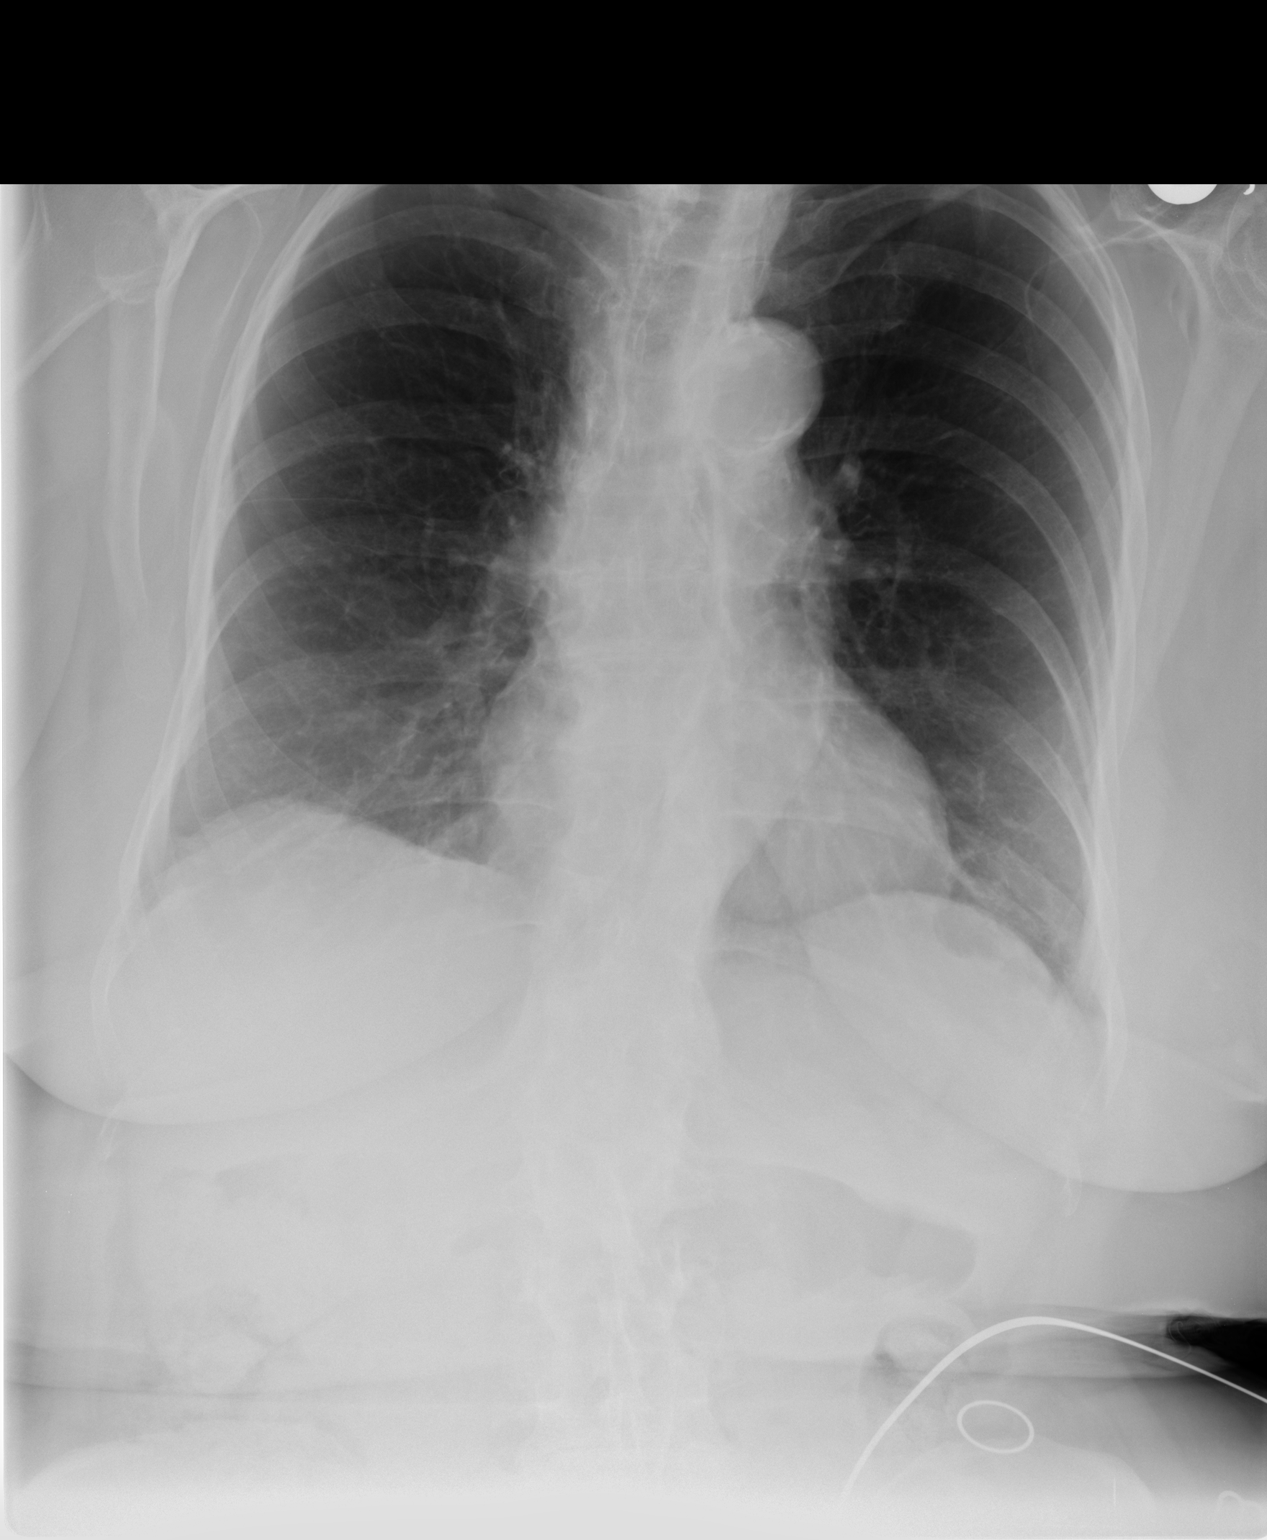

[view not recorded (2 of 2)]
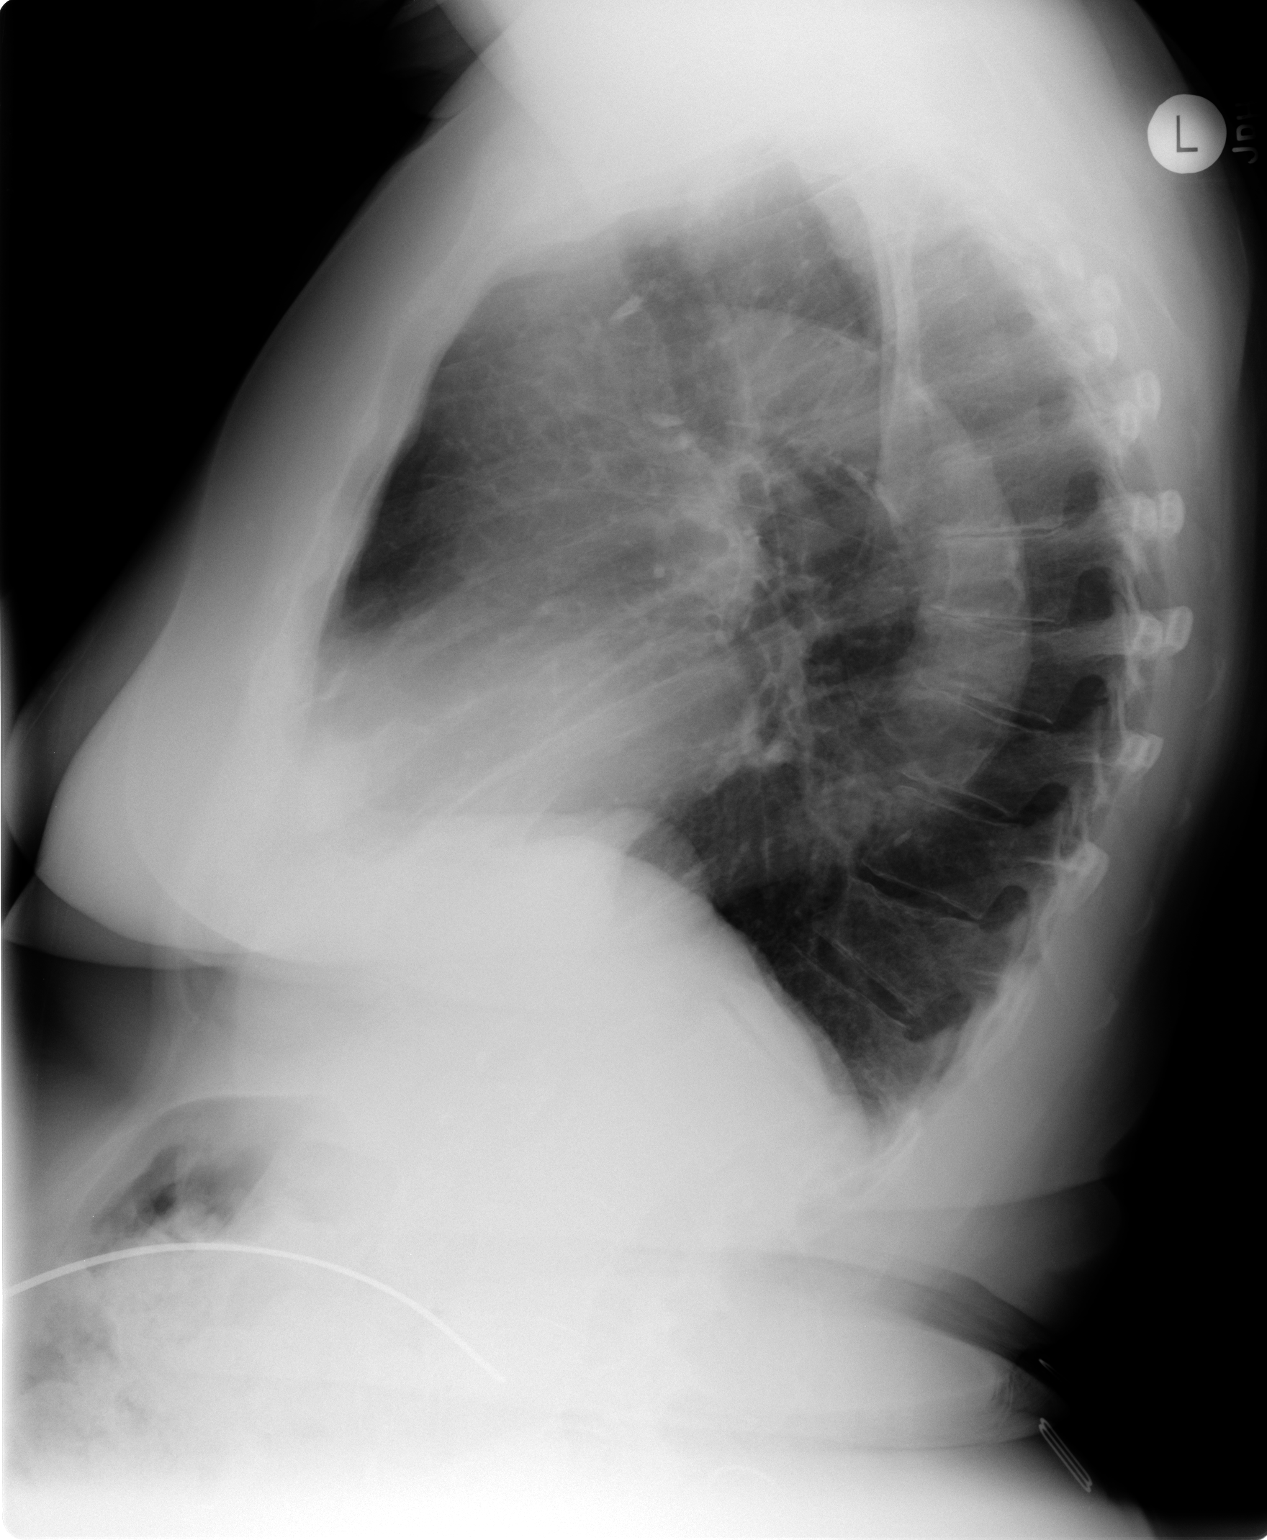

[2 of 2 positions shown; findings below may reference images not displayed]

FINDINGS: Heart size is normal. The aorta shows calcification and unfolding.
The lungs are clear. The vascularity is normal. No effusions. There
is partial compression of a lower thoracic vertebral body, not
present in [id number].
IMPRESSION: No active cardiopulmonary disease.  Aortic atherosclerosis.

## 2015-03-15 MED ORDER — DIPHENHYDRAMINE HCL 12.5 MG/5ML PO ELIX: 12.5000 mg | ORAL_SOLUTION | ORAL | Status: DC | PRN

## 2015-03-15 MED ORDER — METOCLOPRAMIDE HCL 5 MG PO TABS: 5.0000 mg | ORAL_TABLET | Freq: Three times a day (TID) | ORAL | Status: DC | PRN

## 2015-03-15 MED ORDER — TRAMADOL HCL 50 MG PO TABS: 50.0000 mg | ORAL_TABLET | Freq: Four times a day (QID) | ORAL | Status: DC | PRN

## 2015-03-15 MED ORDER — ONDANSETRON HCL 4 MG PO TABS: 4.0000 mg | ORAL_TABLET | Freq: Four times a day (QID) | ORAL | Status: DC | PRN

## 2015-03-15 MED ORDER — ACETAMINOPHEN 325 MG PO TABS: 650.0000 mg | ORAL_TABLET | Freq: Four times a day (QID) | ORAL | Status: DC | PRN

## 2015-03-15 MED ORDER — BISACODYL 10 MG RE SUPP: 10.0000 mg | Freq: Every day | RECTAL | Status: DC | PRN

## 2015-03-15 MED ORDER — METHOCARBAMOL 500 MG PO TABS: 500.0000 mg | ORAL_TABLET | Freq: Four times a day (QID) | ORAL | Status: DC | PRN

## 2015-03-15 MED ORDER — RIVAROXABAN 10 MG PO TABS: 10.0000 mg | ORAL_TABLET | Freq: Every day | ORAL | Status: DC

## 2015-03-15 MED ORDER — FLEET ENEMA 7-19 GM/118ML RE ENEM: 1.0000 | ENEMA | Freq: Once | RECTAL | Status: DC | PRN

## 2015-03-15 MED ORDER — POLYETHYLENE GLYCOL 3350 17 G PO PACK: 17.0000 g | PACK | Freq: Every day | ORAL | Status: DC | PRN

## 2015-03-15 MED ORDER — OXYCODONE HCL 5 MG PO TABS: 5.0000 mg | ORAL_TABLET | ORAL | Status: DC | PRN

## 2015-03-15 MED ORDER — DOCUSATE SODIUM 100 MG PO CAPS: 100.0000 mg | ORAL_CAPSULE | Freq: Two times a day (BID) | ORAL | Status: DC

## 2015-03-15 NOTE — Discharge Summary (Signed)
Physician Discharge Summary   Patient ID: [name] MRN: [id number] DOB/AGE: [birth year] 80 y.o.  Admit date: 03/14/2015 Discharge date: 03/17/2015   Primary Diagnosis:  Osteoarthritis Right knee(s) Admission Diagnoses:  Past Medical History  Diagnosis Date  . Hypertension   . Arthritis   . Osteoporosis   . Headache(784.0)   . Depression   . Glaucoma   . Degenerative arthritis of hip     s/p THR 05/2011  . OAB (overactive bladder)    Discharge Diagnoses:   Principal Problem:   OA (osteoarthritis) of knee  Estimated body mass index is 31.12 kg/(m^2) as calculated from the following:   Height as of this encounter: '5\' 5"'  (1.651 m).   Weight as of this encounter: 84.823 kg (187 lb).  Procedure:  Procedure(s) (LRB): TOTAL KNEE ARTHROPLASTY (Right)   Consults: None  HPI: [name] is a 80 y.o. year old female with end stage OA of her right knee with progressively worsening pain and dysfunction. She has constant pain, with activity and at rest and significant functional deficits with difficulties even with ADLs. She has had extensive non-op management including analgesics, injections of cortisone and viscosupplements, and home exercise program, but remains in significant pain with significant dysfunction.Radiographs show bone on bone arthritis lateral and patellofemoral. She presents now for right Total Knee Arthroplasty.   Laboratory Data: Admission on 03/14/2015  Component Date Value Ref Range Status  . WBC 03/15/2015 7.2  4.0 - 10.5 K/uL Final  . RBC 03/15/2015 3.73* 3.87 - 5.11 MIL/uL Final  . Hemoglobin 03/15/2015 12.0  12.0 - 15.0 g/dL Final  . HCT 03/15/2015 35.4* 36.0 - 46.0 % Final  . MCV 03/15/2015 94.9  78.0 - 100.0 fL Final  . MCH 03/15/2015 32.2  26.0 - 34.0 pg Final  . MCHC 03/15/2015 33.9  30.0 - 36.0 g/dL Final  . RDW 03/15/2015 13.1  11.5 - 15.5 % Final  . Platelets 03/15/2015 171  150 - 400 K/uL Final  . Sodium 03/15/2015 136  135 - 145 mmol/L Final  .  Potassium 03/15/2015 4.1  3.5 - 5.1 mmol/L Final  . Chloride 03/15/2015 106  101 - 111 mmol/L Final  . CO2 03/15/2015 22  22 - 32 mmol/L Final  . Glucose, Bld 03/15/2015 179* 65 - 99 mg/dL Final  . BUN 03/15/2015 20  6 - 20 mg/dL Final  . Creatinine, Ser 03/15/2015 1.00  0.44 - 1.00 mg/dL Final  . Calcium 03/15/2015 8.6* 8.9 - 10.3 mg/dL Final  . GFR calc non Af Amer 03/15/2015 53* >60 mL/min Final  . GFR calc Af Amer 03/15/2015 >60  >60 mL/min Final   Comment: (NOTE) The eGFR has been calculated using the CKD EPI equation. This calculation has not been validated in all clinical situations. eGFR's persistently <60 mL/min signify possible Chronic Kidney Disease.   . Anion gap 03/15/2015 8  5 - 15 Final  . WBC 03/16/2015 8.7  4.0 - 10.5 K/uL Final  . RBC 03/16/2015 3.51* 3.87 - 5.11 MIL/uL Final  . Hemoglobin 03/16/2015 11.3* 12.0 - 15.0 g/dL Final  . HCT 03/16/2015 33.6* 36.0 - 46.0 % Final  . MCV 03/16/2015 95.7  78.0 - 100.0 fL Final  . MCH 03/16/2015 32.2  26.0 - 34.0 pg Final  . MCHC 03/16/2015 33.6  30.0 - 36.0 g/dL Final  . RDW 03/16/2015 13.5  11.5 - 15.5 % Final  . Platelets 03/16/2015 184  150 - 400 K/uL Final  . Sodium 03/16/2015 137  135 -  145 mmol/L Final  . Potassium 03/16/2015 4.2  3.5 - 5.1 mmol/L Final  . Chloride 03/16/2015 107  101 - 111 mmol/L Final  . CO2 03/16/2015 24  22 - 32 mmol/L Final  . Glucose, Bld 03/16/2015 152* 65 - 99 mg/dL Final  . BUN 03/16/2015 16  6 - 20 mg/dL Final  . Creatinine, Ser 03/16/2015 0.71  0.44 - 1.00 mg/dL Final  . Calcium 03/16/2015 8.6* 8.9 - 10.3 mg/dL Final  . GFR calc non Af Amer 03/16/2015 >60  >60 mL/min Final  . GFR calc Af Amer 03/16/2015 >60  >60 mL/min Final   Comment: (NOTE) The eGFR has been calculated using the CKD EPI equation. This calculation has not been validated in all clinical situations. eGFR's persistently <60 mL/min signify possible Chronic Kidney Disease.   . Anion gap 03/16/2015 6  5 - 15 Final  . WBC  03/17/2015 7.0  4.0 - 10.5 K/uL Final  . RBC 03/17/2015 3.77* 3.87 - 5.11 MIL/uL Final  . Hemoglobin 03/17/2015 12.3  12.0 - 15.0 g/dL Final  . HCT 03/17/2015 36.6  36.0 - 46.0 % Final  . MCV 03/17/2015 97.1  78.0 - 100.0 fL Final  . MCH 03/17/2015 32.6  26.0 - 34.0 pg Final  . MCHC 03/17/2015 33.6  30.0 - 36.0 g/dL Final  . RDW 03/17/2015 13.9  11.5 - 15.5 % Final  . Platelets 03/17/2015 195  150 - 400 K/uL Final  Hospital Outpatient Visit on 03/09/2015  Component Date Value Ref Range Status  . MRSA, PCR 03/09/2015 NEGATIVE  NEGATIVE Final  . Staphylococcus aureus 03/09/2015 POSITIVE* NEGATIVE Final   Comment:        The Xpert SA Assay (FDA approved for NASAL specimens in patients over 35 years of age), is one component of a comprehensive surveillance program.  Test performance has been validated by [facility] for patients greater than or equal to 59 year old. It is not intended to diagnose infection nor to guide or monitor treatment.   [name] aPTT 03/09/2015 55* 24 - 37 seconds Final   Comment:        IF BASELINE aPTT IS ELEVATED, SUGGEST PATIENT RISK ASSESSMENT BE USED TO DETERMINE APPROPRIATE ANTICOAGULANT THERAPY.   . WBC 03/09/2015 4.7  4.0 - 10.5 K/uL Final  . RBC 03/09/2015 4.28  3.87 - 5.11 MIL/uL Final  . Hemoglobin 03/09/2015 13.7  12.0 - 15.0 g/dL Final  . HCT 03/09/2015 40.7  36.0 - 46.0 % Final  . MCV 03/09/2015 95.1  78.0 - 100.0 fL Final  . MCH 03/09/2015 32.0  26.0 - 34.0 pg Final  . MCHC 03/09/2015 33.7  30.0 - 36.0 g/dL Final  . RDW 03/09/2015 13.4  11.5 - 15.5 % Final  . Platelets 03/09/2015 211  150 - 400 K/uL Final  . Sodium 03/09/2015 137  135 - 145 mmol/L Final  . Potassium 03/09/2015 4.1  3.5 - 5.1 mmol/L Final  . Chloride 03/09/2015 102  101 - 111 mmol/L Final  . CO2 03/09/2015 28  22 - 32 mmol/L Final  . Glucose, Bld 03/09/2015 90  65 - 99 mg/dL Final  . BUN 03/09/2015 21* 6 - 20 mg/dL Final  . Creatinine, Ser 03/09/2015 1.13* 0.44 - 1.00 mg/dL  Final  . Calcium 03/09/2015 9.7  8.9 - 10.3 mg/dL Final  . Total Protein 03/09/2015 7.1  6.5 - 8.1 g/dL Final  . Albumin 03/09/2015 4.0  3.5 - 5.0 g/dL Final  . AST 03/09/2015 23  15 - 41 U/L Final  . ALT 03/09/2015 22  14 - 54 U/L Final  . Alkaline Phosphatase 03/09/2015 95  38 - 126 U/L Final  . Total Bilirubin 03/09/2015 0.4  0.3 - 1.2 mg/dL Final  . GFR calc non Af Amer 03/09/2015 46* >60 mL/min Final  . GFR calc Af Amer 03/09/2015 53* >60 mL/min Final   Comment: (NOTE) The eGFR has been calculated using the CKD EPI equation. This calculation has not been validated in all clinical situations. eGFR's persistently <60 mL/min signify possible Chronic Kidney Disease.   . Anion gap 03/09/2015 7  5 - 15 Final  . Prothrombin Time 03/09/2015 14.5  11.6 - 15.2 seconds Final  . INR 03/09/2015 1.16  0.00 - 1.49 Final  . ABO/RH(D) 03/09/2015 AB POS   Final  . Antibody Screen 03/09/2015 NEG   Final  . Sample Expiration 03/09/2015 03/17/2015   Final  . Extend sample reason 03/09/2015 NO TRANSFUSIONS OR PREGNANCY IN THE PAST 3 MONTHS   Final  . Color, Urine 03/11/2015 YELLOW  YELLOW Final  . APPearance 03/11/2015 CLEAR  CLEAR Final  . Specific Gravity, Urine 03/11/2015 1.008  1.005 - 1.030 Final  . pH 03/11/2015 7.0  5.0 - 8.0 Final  . Glucose, UA 03/11/2015 NEGATIVE  NEGATIVE mg/dL Final  . Hgb urine dipstick 03/11/2015 NEGATIVE  NEGATIVE Final  . Bilirubin Urine 03/11/2015 NEGATIVE  NEGATIVE Final  . Ketones, ur 03/11/2015 NEGATIVE  NEGATIVE mg/dL Final  . Protein, ur 03/11/2015 NEGATIVE  NEGATIVE mg/dL Final  . Nitrite 03/11/2015 NEGATIVE  NEGATIVE Final  . Leukocytes, UA 03/11/2015 MODERATE* NEGATIVE Final  . Squamous Epithelial / LPF 03/11/2015 0-5* NONE SEEN Final  . WBC, UA 03/11/2015 6-30  0 - 5 WBC/hpf Final  . RBC / HPF 03/11/2015 0-5  0 - 5 RBC/hpf Final  . Bacteria, UA 03/11/2015 RARE* NONE SEEN Final  . Casts 03/11/2015 HYALINE CASTS* NEGATIVE Final     X-Rays:No results  found.  EKG: Orders placed or performed in visit on 03/09/15  . EKG 12-Lead     Hospital Course: Eldena Dede is a 80 y.o. who was admitted to [facility]. They were brought to the operating room on 03/14/2015 and underwent Procedure(s): TOTAL KNEE ARTHROPLASTY.  Patient tolerated the procedure well and was later transferred to the recovery room and then to the orthopaedic floor for postoperative care.  They were given PO and IV analgesics for pain control following their surgery.  They were given 24 hours of postoperative antibiotics of  Anti-infectives    Start     Dose/Rate Route Frequency Ordered Stop   03/14/15 2000  ceFAZolin (ANCEF) IVPB 2 g/50 mL premix     2 g 100 mL/hr over 30 Minutes Intravenous Every 6 hours 03/14/15 1623 03/15/15 0237   03/14/15 1118  ceFAZolin (ANCEF) IVPB 2 g/50 mL premix     2 g 100 mL/hr over 30 Minutes Intravenous On call to O.R. 03/14/15 1118 03/14/15 1314     and started on DVT prophylaxis in the form of Xarelto.   PT and OT were ordered for total joint protocol.  Discharge planning consulted to help with postop disposition and equipment needs.  Patient had a decent night on the evening of surgery.  They started to get up OOB with therapy on day one. Hemovac drain was pulled without difficulty.  HGB was noted to be 12.0 on POD 1.  Continued to work with therapy into day two.  Dressing  was changed on day two and the incision was healing well.  Patient was seen in rounds on POD 3 and had progressed with therapy, improved from a postop standpoint and was ready to transfer to [facility].  Discharge to SNF - [facility] Follow up - in 1 week, next Thursday 03/24/2015 Activity - WBAT Disposition - Skilled nursing facility Condition Upon Discharge - Good D/C Meds - See DC Summary DVT Prophylaxis - Xarelto      Discharge Instructions    Call MD / Call 911    Complete by:  As directed   If you experience chest pain or shortness  of breath, CALL 911 and be transported to the hospital emergency room.  If you develope a fever above 101 F, pus (white drainage) or increased drainage or redness at the wound, or calf pain, call your surgeon's office.     Change dressing    Complete by:  As directed   Change dressing daily with sterile 4 x 4 inch gauze dressing and apply TED hose. Do not submerge the incision under water.     Constipation Prevention    Complete by:  As directed   Drink plenty of fluids.  Prune juice may be helpful.  You may use a stool softener, such as Colace (over the counter) 100 mg twice a day.  Use MiraLax (over the counter) for constipation as needed.     Diet - low sodium heart healthy    Complete by:  As directed      Discharge instructions    Complete by:  As directed   Pick up stool softner and laxative for home use following surgery while on pain medications. Do not submerge incision under water. Please use good hand washing techniques while changing dressing each day. May shower starting three days after surgery. Please use a clean towel to pat the incision dry following showers. Continue to use ice for pain and swelling after surgery. Do not use any lotions or creams on the incision until instructed by your surgeon.  Take Xarelto for two and a half more weeks, then discontinue Xarelto. Once the patient has completed the blood thinner regimen, then take a Baby 81 mg Aspirin daily for three more weeks.  Postoperative Constipation Protocol  Constipation - defined medically as fewer than three stools per week and severe constipation as less than one stool per week.  One of the most common issues patients have following surgery is constipation.  Even if you have a regular bowel pattern at home, your normal regimen is likely to be disrupted due to multiple reasons following surgery.  Combination of anesthesia, postoperative narcotics, change in appetite and fluid intake all can affect your bowels.   In order to avoid complications following surgery, here are some recommendations in order to help you during your recovery period.  Colace (docusate) - Pick up an over-the-counter form of Colace or another stool softener and take twice a day as long as you are requiring postoperative pain medications.  Take with a full glass of water daily.  If you experience loose stools or diarrhea, hold the colace until you stool forms back up.  If your symptoms do not get better within 1 week or if they get worse, check with your doctor.  Dulcolax (bisacodyl) - Pick up over-the-counter and take as directed by the product packaging as needed to assist with the movement of your bowels.  Take with a full glass of  water.  Use this product as needed if not relieved by Colace only.   MiraLax (polyethylene glycol) - Pick up over-the-counter to have on hand.  MiraLax is a solution that will increase the amount of water in your bowels to assist with bowel movements.  Take as directed and can mix with a glass of water, juice, soda, coffee, or tea.  Take if you go more than two days without a movement. Do not use MiraLax more than once per day. Call your doctor if you are still constipated or irregular after using this medication for 7 days in a row.  If you continue to have problems with postoperative constipation, please contact the office for further assistance and recommendations.  If you experience "the worst abdominal pain ever" or develop nausea or vomiting, please contact the office immediatly for further recommendations for treatment.  When discharged from the skilled rehab facility, please have the facility set up the patient's [place] prior to being released.  Please make sure this gets set up prior to release in order to avoid any lapse of therapy following the rehab stay.  Also provide the patient with their medications at time of release from the facility to include their pain medication, the  muscle relaxants, and their blood thinner medication.  If the patient is still at the rehab facility at time of follow up appointment, please also assist the patient in arranging follow up appointment in our office and any transportation needs. ICE to the affected knee or hip every three hours for 30 minutes at a time and then as needed for pain and swelling.     Do not put a pillow under the knee. Place it under the heel.    Complete by:  As directed      Do not sit on low chairs, stoools or toilet seats, as it may be difficult to get up from low surfaces    Complete by:  As directed      Driving restrictions    Complete by:  As directed   No driving until released by the physician.     Increase activity slowly as tolerated    Complete by:  As directed      Lifting restrictions    Complete by:  As directed   No lifting until released by the physician.     Patient may shower    Complete by:  As directed   You may shower without a dressing once there is no drainage.  Do not wash over the wound.  If drainage remains, do not shower until drainage stops.     TED hose    Complete by:  As directed   Use stockings (TED hose) for 3 weeks on both leg(s).  You may remove them at night for sleeping.     Weight bearing as tolerated    Complete by:  As directed   Laterality:  right  Extremity:  Lower            Medication List    STOP taking these medications        multivitamin with minerals Tabs tablet     naproxen sodium 220 MG tablet  Commonly known as:  ANAPROX      TAKE these medications        acetaminophen 325 MG tablet  Commonly known as:  TYLENOL  Take 2 tablets (650 mg total) by mouth every 6 (six) hours as needed for mild pain (or  Fever >/= 101).     bisacodyl 10 MG suppository  Commonly known as:  DULCOLAX  Place 1 suppository (10 mg total) rectally daily as needed for moderate constipation.     buPROPion 150 MG 12 hr tablet  Commonly known as:  WELLBUTRIN SR  take  1 tablet by mouth twice a day     buPROPion 200 MG 12 hr tablet  Commonly known as:  WELLBUTRIN SR  Take 200 mg by mouth 2 (two) times daily.     carvedilol 12.5 MG tablet  Commonly known as:  COREG  take 1 tablet by mouth twice a day     colestipol 1 g tablet  Commonly known as:  COLESTID  Take 2 tablets (2 g total) by mouth 2 (two) times daily.     diphenhydrAMINE 12.5 MG/5ML elixir  Commonly known as:  BENADRYL  Take 5-10 mLs (12.5-25 mg total) by mouth every 4 (four) hours as needed for itching.     docusate sodium 100 MG capsule  Commonly known as:  COLACE  Take 1 capsule (100 mg total) by mouth 2 (two) times daily.     furosemide 40 MG tablet  Commonly known as:  LASIX  take 1 tablet by mouth once daily     lisinopril 30 MG tablet  Commonly known as:  PRINIVIL,ZESTRIL  take 1 tablet by mouth once daily     methocarbamol 500 MG tablet  Commonly known as:  ROBAXIN  Take 1 tablet (500 mg total) by mouth every 6 (six) hours as needed for muscle spasms.     metoCLOPramide 5 MG tablet  Commonly known as:  REGLAN  Take 1 tablet (5 mg total) by mouth every 8 (eight) hours as needed for nausea (if ondansetron (ZOFRAN) ineffective.).     ondansetron 4 MG tablet  Commonly known as:  ZOFRAN  Take 1 tablet (4 mg total) by mouth every 6 (six) hours as needed for nausea.     oxyCODONE 5 MG immediate release tablet  Commonly known as:  Oxy IR/ROXICODONE  Take 1-2 tablets (5-10 mg total) by mouth every 3 (three) hours as needed for moderate pain or severe pain.     polyethylene glycol packet  Commonly known as:  MIRALAX / GLYCOLAX  Take 17 g by mouth daily as needed for mild constipation.     RESTASIS 0.05 % ophthalmic emulsion  Generic drug:  cycloSPORINE  Place 1 drop into both eyes daily.     rivaroxaban 10 MG Tabs tablet  Commonly known as:  XARELTO  Take 1 tablet (10 mg total) by mouth daily with breakfast. Take Xarelto for two and a half more weeks, then discontinue  Xarelto. Once the patient has completed the blood thinner regimen, then take a Baby 81 mg Aspirin daily for three more weeks.     sodium phosphate 7-19 GM/118ML Enem  Place 133 mLs (1 enema total) rectally once as needed for severe constipation.     timolol 0.5 % ophthalmic solution  Commonly known as:  TIMOPTIC  Place 1 drop into both eyes 2 (two) times daily.     traMADol 50 MG tablet  Commonly known as:  ULTRAM  Take 1-2 tablets (50-100 mg total) by mouth every 6 (six) hours as needed (mild pain).       Follow-up Information    Follow up with Gearlean Alf, MD. Schedule an appointment as soon as possible for a visit on 03/24/2015.   Specialty:  Orthopedic Surgery  Why:  Call office ASAP at [phone] to setup appointment on Thursday 03/24/2015 with Dr. Wynelle Link.   Contact information:   [address] [place] [address] [phone]       Signed: Arlee Muslim, PA-C Orthopaedic Surgery 03/17/2015, 7:43 AM

## 2015-03-15 NOTE — Care Management Note (Signed)
Case Management Note  Patient Details  Name: [name] MRN: [id number] Date of Birth: [birth year]  Subjective/Objective:                  TOTAL KNEE ARTHROPLASTY (Right) Action/Plan: Discharge planning Expected Discharge Date:  03/16/15               Expected Discharge Plan:  [place]  In-House Referral:     Discharge planning Services  CM Consult  Post Acute Care Choice:    Choice offered to:     DME Arranged:    DME Agency:     HH Arranged:    [place] Agency:     Status of Service:  Completed, signed off  Medicare Important Message Given:    Date Medicare IM Given:    Medicare IM give by:    Date Additional Medicare IM Given:    Additional Medicare Important Message give by:     If discussed at [place] of Stay Meetings, dates discussed:    Additional Comments: CM notes plan is for SNF; CSW arranging.  No other CM needs were communicated. Dellie Catholic, RN 03/15/2015, 1:32 PM

## 2015-03-15 NOTE — Discharge Instructions (Signed)
° °Dr. Frank Aluisio °Total Joint Specialist °[facility] °[address] °[place], [place] [address] °[phone] ° °TOTAL KNEE REPLACEMENT POSTOPERATIVE DIRECTIONS ° °Knee Rehabilitation, Guidelines Following Surgery  °Results after knee surgery are often greatly improved when you follow the exercise, range of motion and muscle strengthening exercises prescribed by your doctor. Safety measures are also important to protect the knee from further injury. Any time any of these exercises cause you to have increased pain or swelling in your knee joint, decrease the amount until you are comfortable again and slowly increase them. If you have problems or questions, call your caregiver or physical therapist for advice.  ° °HOME CARE INSTRUCTIONS  °Remove items at home which could result in a fall. This includes throw rugs or furniture in walking pathways.  °· ICE to the affected knee every three hours for 30 minutes at a time and then as needed for pain and swelling.  Continue to use ice on the knee for pain and swelling from surgery. You may notice swelling that will progress down to the foot and ankle.  This is normal after surgery.  Elevate the leg when you are not up walking on it.   °· Continue to use the breathing machine which will help keep your temperature down.  It is common for your temperature to cycle up and down following surgery, especially at night when you are not up moving around and exerting yourself.  The breathing machine keeps your lungs expanded and your temperature down. °· Do not place pillow under knee, focus on keeping the knee straight while resting ° °DIET °You may resume your previous home diet once your are discharged from the hospital. ° °DRESSING / WOUND CARE / SHOWERING °You may shower 3 days after surgery, but keep the wounds dry during showering.  You may use an occlusive plastic wrap (Press'n Seal for example), NO SOAKING/SUBMERGING IN THE BATHTUB.  If the  bandage gets wet, change with a clean dry gauze.  If the incision gets wet, pat the wound dry with a clean towel. °You may start showering once you are discharged home but do not submerge the incision under water. Just pat the incision dry and apply a dry gauze dressing on daily. °Change the surgical dressing daily and reapply a dry dressing each time. ° °ACTIVITY °Walk with your walker as instructed. °Use walker as long as suggested by your caregivers. °Avoid periods of inactivity such as sitting longer than an hour when not asleep. This helps prevent blood clots.  °You may resume a sexual relationship in one month or when given the OK by your doctor.  °You may return to work once you are cleared by your doctor.  °Do not drive a car for 6 weeks or until released by you surgeon.  °Do not drive while taking narcotics. ° °WEIGHT BEARING °Weight bearing as tolerated with assist device (walker, cane, etc) as directed, use it as long as suggested by your surgeon or therapist, typically at least 4-6 weeks. ° °POSTOPERATIVE CONSTIPATION PROTOCOL °Constipation - defined medically as fewer than three stools per week and severe constipation as less than one stool per week. ° °One of the most common issues patients have following surgery is constipation.  Even if you have a regular bowel pattern at home, your normal regimen is likely to be disrupted due to multiple reasons following surgery.  Combination of anesthesia, postoperative narcotics, change in appetite and fluid intake all can affect your bowels.    In order to avoid complications following surgery, here are some recommendations in order to help you during your recovery period. ° °Colace (docusate) - Pick up an over-the-counter form of Colace or another stool softener and take twice a day as long as you are requiring postoperative pain medications.  Take with a full glass of water daily.  If you experience loose stools or diarrhea, hold the colace until you stool forms  back up.  If your symptoms do not get better within 1 week or if they get worse, check with your doctor. ° °Dulcolax (bisacodyl) - Pick up over-the-counter and take as directed by the product packaging as needed to assist with the movement of your bowels.  Take with a full glass of water.  Use this product as needed if not relieved by Colace only.  ° °MiraLax (polyethylene glycol) - Pick up over-the-counter to have on hand.  MiraLax is a solution that will increase the amount of water in your bowels to assist with bowel movements.  Take as directed and can mix with a glass of water, juice, soda, coffee, or tea.  Take if you go more than two days without a movement. °Do not use MiraLax more than once per day. Call your doctor if you are still constipated or irregular after using this medication for 7 days in a row. ° °If you continue to have problems with postoperative constipation, please contact the office for further assistance and recommendations.  If you experience "the worst abdominal pain ever" or develop nausea or vomiting, please contact the office immediatly for further recommendations for treatment. ° °ITCHING ° If you experience itching with your medications, try taking only a single pain pill, or even half a pain pill at a time.  You can also use Benadryl over the counter for itching or also to help with sleep.  ° °TED HOSE STOCKINGS °Wear the elastic stockings on both legs for three weeks following surgery during the day but you may remove then at night for sleeping. ° °MEDICATIONS °See your medication summary on the “After Visit Summary” that the nursing staff will review with you prior to discharge.  You may have some home medications which will be placed on hold until you complete the course of blood thinner medication.  It is important for you to complete the blood thinner medication as prescribed by your surgeon.  Continue your approved medications as instructed at time of  discharge. ° °PRECAUTIONS °If you experience chest pain or shortness of breath - call 911 immediately for transfer to the hospital emergency department.  °If you develop a fever greater that 101 F, purulent drainage from wound, increased redness or drainage from wound, foul odor from the wound/dressing, or calf pain - CONTACT YOUR SURGEON.   °                                                °FOLLOW-UP APPOINTMENTS °Make sure you keep all of your appointments after your operation with your surgeon and caregivers. You should call the office at the above phone number and make an appointment for approximately two weeks after the date of your surgery or on the date instructed by your surgeon outlined in the "After Visit Summary". ° ° °RANGE OF MOTION AND STRENGTHENING EXERCISES  °Rehabilitation of the knee is important following a knee injury or   an operation. After just a few days of immobilization, the muscles of the thigh which control the knee become weakened and shrink (atrophy). Knee exercises are designed to build up the tone and strength of the thigh muscles and to improve knee motion. Often times heat used for twenty to thirty minutes before working out will loosen up your tissues and help with improving the range of motion but do not use heat for the first two weeks following surgery. These exercises can be done on a training (exercise) mat, on the floor, on a table or on a bed. Use what ever works the best and is most comfortable for you Knee exercises include:  °Leg Lifts - While your knee is still immobilized in a splint or cast, you can do straight leg raises. Lift the leg to 60 degrees, hold for 3 sec, and slowly lower the leg. Repeat 10-20 times 2-3 times daily. Perform this exercise against resistance later as your knee gets better.  °Quad and Hamstring Sets - Tighten up the muscle on the front of the thigh (Quad) and hold for 5-10 sec. Repeat this 10-20 times hourly. Hamstring sets are done by pushing the  foot backward against an object and holding for 5-10 sec. Repeat as with quad sets.  °· Leg Slides: Lying on your back, slowly slide your foot toward your buttocks, bending your knee up off the floor (only go as far as is comfortable). Then slowly slide your foot back down until your leg is flat on the floor again. °· Angel Wings: Lying on your back spread your legs to the side as far apart as you can without causing discomfort.  °A rehabilitation program following serious knee injuries can speed recovery and prevent re-injury in the future due to weakened muscles. Contact your doctor or a physical therapist for more information on knee rehabilitation.  ° °IF YOU ARE TRANSFERRED TO A SKILLED REHAB FACILITY °If the patient is transferred to a skilled rehab facility following release from the hospital, a list of the current medications will be sent to the facility for the patient to continue.  When discharged from the skilled rehab facility, please have the facility set up the patient's Home Health Physical Therapy prior to being released. Also, the skilled facility will be responsible for providing the patient with their medications at time of release from the facility to include their pain medication, the muscle relaxants, and their blood thinner medication. If the patient is still at the rehab facility at time of the two week follow up appointment, the skilled rehab facility will also need to assist the patient in arranging follow up appointment in our office and any transportation needs. ° °MAKE SURE YOU:  °Understand these instructions.  °Get help right away if you are not doing well or get worse.  ° ° °Pick up stool softner and laxative for home use following surgery while on pain medications. °Do not submerge incision under water. °Please use good hand washing techniques while changing dressing each day. °May shower starting three days after surgery. °Please use a clean towel to pat the incision dry following  showers. °Continue to use ice for pain and swelling after surgery. °Do not use any lotions or creams on the incision until instructed by your surgeon. ° °Take Xarelto for two and a half more weeks, then discontinue Xarelto. °Once the patient has completed the blood thinner regimen, then take a Baby 81 mg Aspirin daily for three more weeks. ° ° °Information   Information on my medicine - XARELTO® (Rivaroxaban) ° °This medication education was reviewed with me or my healthcare representative as part of my discharge preparation.  The pharmacist that spoke with me during my hospital stay was:  Green, Terri L, RPH ° °Why was Xarelto® prescribed for you? °Xarelto® was prescribed for you to reduce the risk of blood clots forming after orthopedic surgery. The medical term for these abnormal blood clots is venous thromboembolism (VTE). ° °What do you need to know about xarelto® ? °Take your Xarelto® ONCE DAILY at the same time every day. °You may take it either with or without food. ° °If you have difficulty swallowing the tablet whole, you may crush it and mix in applesauce just prior to taking your dose. ° °Take Xarelto® exactly as prescribed by your doctor and DO NOT stop taking Xarelto® without talking to the doctor who prescribed the medication.  Stopping without other VTE prevention medication to take the place of Xarelto® may increase your risk of developing a clot. ° °After discharge, you should have regular check-up appointments with your healthcare provider that is prescribing your Xarelto®.   ° °What do you do if you miss a dose? °If you miss a dose, take it as soon as you remember on the same day then continue your regularly scheduled once daily regimen the next day. Do not take two doses of Xarelto® on the same day.  ° °Important Safety Information °A possible side effect of Xarelto® is bleeding. You should call your healthcare provider right away if you experience any of the following: °? Bleeding from an injury or your  nose that does not stop. °? Unusual colored urine (red or dark brown) or unusual colored stools (red or black). °? Unusual bruising for unknown reasons. °? A serious fall or if you hit your head (even if there is no bleeding). ° °Some medicines may interact with Xarelto® and might increase your risk of bleeding while on Xarelto®. To help avoid this, consult your healthcare provider or pharmacist prior to using any new prescription or non-prescription medications, including herbals, vitamins, non-steroidal anti-inflammatory drugs (NSAIDs) and supplements. ° °This website has more information on Xarelto®: www.xarelto.com. ° ° ° °

## 2015-03-15 NOTE — Progress Notes (Signed)
OT Note  Patient Details Name: Lucianne Kotlowski MRN: [id number] DOB: 1935/05/14   Cancelled Treatment:    Reason Eval/Treat Not Completed: Other (comment) -- Patient plans to go to SNF at discharge. Will defer OT needs to SNF. Thank you.  Izamar Linden A 03/15/2015, 10:05 AM

## 2015-03-15 NOTE — Progress Notes (Signed)
   Subjective: 1 Day Post-Op Procedure(s) (LRB): TOTAL KNEE ARTHROPLASTY (Right) Patient reports pain as mild.   Patient seen in rounds with Dr. Wynelle Link.  Doing okay this morning. Patient is well, but has had some minor complaints of pain in the knee, requiring pain medications We will start therapy today.  Plan is to go to [facility] after hospital stay.  Objective: Vital signs in last 24 hours: Temp:  [97.4 F (36.3 C)-98.6 F (37 C)] 97.5 F (36.4 C) (02/14 0506) Pulse Rate:  [15-65] 54 (02/14 0506) Resp:  [9-18] 12 (02/13 1627) BP: (97-155)/(50-94) 124/50 mmHg (02/14 0506) SpO2:  [97 %-100 %] 98 % (02/14 0506) Weight:  [84.823 kg (187 lb)] 84.823 kg (187 lb) (02/13 1118)  Intake/Output from previous day:  Intake/Output Summary (Last 24 hours) at 03/15/15 0749 Last data filed at 03/15/15 0508  Gross per 24 hour  Intake 3526.25 ml  Output   2155 ml  Net 1371.25 ml    Intake/Output this shift: UOP 1000 since around [place]  Labs:  Recent Labs  03/15/15 0418  HGB 12.0    Recent Labs  03/15/15 0418  WBC 7.2  RBC 3.73*  HCT 35.4*  PLT 171    Recent Labs  03/15/15 0418  NA 136  K 4.1  CL 106  CO2 22  BUN 20  CREATININE 1.00  GLUCOSE 179*  CALCIUM 8.6*   No results for input(s): LABPT, INR in the last 72 hours.  EXAM General - Patient is Alert, Appropriate and Oriented Extremity - Neurovascular intact Sensation intact distally Dorsiflexion/Plantar flexion intact Dressing - dressing C/D/I Motor Function - intact, moving foot and toes well on exam.  Hemovac pulled without difficulty.  Past Medical History  Diagnosis Date  . Hypertension   . Arthritis   . Osteoporosis   . Headache(784.0)   . Depression   . Glaucoma   . Degenerative arthritis of hip     s/p THR 05/2011  . OAB (overactive bladder)     Assessment/Plan: 1 Day Post-Op Procedure(s) (LRB): TOTAL KNEE ARTHROPLASTY (Right) Principal Problem:   OA (osteoarthritis) of  knee  Estimated body mass index is 31.12 kg/(m^2) as calculated from the following:   Height as of this encounter: 5\' 5"  (1.651 m).   Weight as of this encounter: 84.823 kg (187 lb). Advance diet Up with therapy Discharge to SNF when ortho/medically ready  DVT Prophylaxis - Xarelto Weight-Bearing as tolerated to right leg D/C O2 and Pulse OX and try on Room Air Lisinopril on hold temp. HGB 12.0 postop  Arlee Muslim, PA-C Orthopaedic Surgery 03/15/2015, 7:49 AM

## 2015-03-15 NOTE — Clinical Social Work Note (Signed)
Clinical Social Work Assessment  Patient Details  Name: [name] MRN: [id number] Date of Birth: Dec 26, 1935  Date of referral:  03/15/15               Reason for consult:  Discharge Planning, Facility Placement                Permission sought to share information with:  Chartered certified accountant granted to share information::  Yes, Verbal Permission Granted  Name::        Agency::     Relationship::     Contact Information:     Housing/Transportation Living arrangements for the past 2 months:  Single Family Home Source of Information:  Patient Patient Interpreter Needed:  None Criminal Activity/Legal Involvement Pertinent to Current Situation/Hospitalization:  No - Comment as needed Significant Relationships:  Friend, Adult Children Lives with:  Self Do you feel safe going back to the place where you live?    Need for family participation in patient care:  No (Coment)  Care giving concerns:  Pt's care cannot be managed at home following hospital d/c.   Social Worker assessment / plan:  Pt was hospitalized on 03/14/15 for a pre planned right total knee arthroplasty. CSW has met with pt to assist with d/c planning . Pt is part of the medicare bundle program. Pt has made prior arrangements to have ST Rehab at [facility] at d/c. CSW has sent clinicals to SNF and d/c plan has been confirmed. CSW will continue to follow to assist with d/c planning to SNF.   Employment status:  Retired Forensic scientist:  Medicare PT Recommendations:  [place] / Referral to community resources:     Patient/Family's Response to care:  Pt feels ST Rehab is needed.  Patient/Family's Understanding of and Emotional Response to Diagnosis, Current Treatment, and Prognosis: Pt is aware of her medical status. She is motivated to work with therapy and is looking forward to having rehab at [facility].  Emotional Assessment Appearance:  Appears stated  age Attitude/Demeanor/Rapport:  Other (cooperative) Affect (typically observed):  Calm, Appropriate, Pleasant Orientation:  Oriented to Self, Oriented to Place, Oriented to  Time, Oriented to Situation Alcohol / Substance use:  Not Applicable Psych involvement (Current and /or in the community):  No (Comment)  Discharge Needs  Concerns to be addressed:  Discharge Planning Concerns Readmission within the last 30 days:  No Current discharge risk:  None Barriers to Discharge:  No Barriers Identified   [name], Ona 03/15/2015, 4:20 PM

## 2015-03-15 NOTE — Progress Notes (Signed)
Physical Therapy Treatment Patient Details Name: [name] MRN: [id number] DOB: 07/18/1935 Today's Date: 03/15/2015    History of Present Illness s/p R TKA    PT Comments    Progressing slowly, will benefit form SNF  Follow Up Recommendations  SNF     Equipment Recommendations  None recommended by PT    Recommendations for Other Services       Precautions / Restrictions Precautions Precautions: Knee Precaution Comments: pt able to do I SLR Required Braces or Orthoses: Knee Immobilizer - Right Knee Immobilizer - Right: Discontinue once straight leg raise with < 10 degree lag Restrictions Weight Bearing Restrictions: No Other Position/Activity Restrictions: WBAT    Mobility  Bed Mobility Overal bed mobility: Needs Assistance Bed Mobility: Sit to Supine       Sit to supine: Min assist   General bed mobility comments: cues for technique and to self assist, assist needed with RLE  Transfers Overall transfer level: Needs assistance Equipment used: Rolling walker (2 wheeled) Transfers: Sit to/from Stand           General transfer comment: cues for hand placement and control of descent  Ambulation/Gait Ambulation/Gait assistance: Min guard;Min assist Ambulation Distance (Feet): 15 Feet Assistive device: Rolling walker (2 wheeled) Gait Pattern/deviations: Step-to pattern;Trunk flexed;Decreased step length - right;Decreased step length - left Gait velocity: decr   General Gait Details: cues for sequence, RW safety   Stairs            Wheelchair Mobility    Modified Rankin (Stroke Patients Only)       Balance                                    Cognition Arousal/Alertness: Awake/alert Behavior During Therapy: WFL for tasks assessed/performed Overall Cognitive Status: Within Functional Limits for tasks assessed                      Exercises Total Joint Exercises Heel Slides: AAROM;Right;10 reps Straight Leg Raises:  AROM;Right;10 reps    General Comments General comments (skin integrity, edema, etc.): pt up in bathroom alone, RN notified      Pertinent Vitals/Pain Pain Assessment: 0-10 Pain Score: 3  Pain Location: right knee Pain Descriptors / Indicators: Sore Pain Intervention(s): Limited activity within patient's tolerance;Monitored during session    Home Living                      Prior Function            PT Goals (current goals can now be found in the care plan section) Acute Rehab PT Goals Patient Stated Goal: to go to rehab PT Goal Formulation: With patient Time For Goal Achievement: 03/22/15 Potential to Achieve Goals: Good Progress towards PT goals: Progressing toward goals    Frequency  7X/week    PT Plan Current plan remains appropriate    Co-evaluation             End of Session Equipment Utilized During Treatment: Gait belt Activity Tolerance: Patient tolerated treatment well Patient left: in bed;with call bell/phone within reach (bed alarm not working, RN aware)     Time: [id number] PT Time Calculation (min) (ACUTE ONLY): 12 min  Charges:  $Gait Training: 8-22 mins                    G Codes:  [facility] 03/15/2015, 2:52 PM

## 2015-03-15 NOTE — NC FL2 (Signed)
[place] LEVEL OF CARE SCREENING TOOL     IDENTIFICATION  Patient Name: [name] Birthdate: February 20, 1935 Sex: female Admission Date (Current Location): 03/14/2015  [facility] and [place] Number:  Herbalist and Address:  [facility],  [place] [address], [place]      Provider Number: [id number]  Attending Physician Name and Address:  Gaynelle Arabian, MD  Relative Name and Phone Number:       Current Level of Care: Hospital Recommended Level of Care: [place] Prior Approval Number:    Date Approved/Denied:   PASRR Number: [id number]  Discharge Plan: SNF    Current Diagnoses: Patient Active Problem List   Diagnosis Date Noted  . OA (osteoarthritis) of knee 03/14/2015  . Diastolic dysfunction [id number]  . LBBB (left bundle branch block) 07/19/2013  . Obesity (BMI 30-39.9) 11/11/2012  . Chronic diarrhea   . Hypertension   . OAB (overactive bladder)   . Depression   . Osteopenia   . Symptoms referable to back 01/07/2012  . Cervical spondylosis 01/07/2012  . Degenerative arthritis of hip 06/01/2011  . Cataract 11/17/2010  . Glaucoma 11/17/2010    Orientation RESPIRATION BLADDER Height & Weight     Self, Time, Situation, Place  Normal Continent Weight: 84.823 kg (187 lb) (as per PST 03/11/15) Height:  5\' 5"  (165.1 cm)  BEHAVIORAL SYMPTOMS/MOOD NEUROLOGICAL BOWEL NUTRITION STATUS  Other (Comment) (no behaviors)   Continent Diet  AMBULATORY STATUS COMMUNICATION OF NEEDS Skin   Limited Assist Verbally Surgical wounds                       Personal Care Assistance Level of Assistance  Bathing, Feeding, Dressing Bathing Assistance: Limited assistance Feeding assistance: Independent Dressing Assistance: Limited assistance     Functional Limitations Info  Sight, Hearing, Speech Sight Info: Adequate Hearing Info: Adequate Speech Info: Adequate    SPECIAL CARE FACTORS FREQUENCY  PT (By licensed  PT), OT (By licensed OT)     PT Frequency: 6 x wk OT Frequency: 5 x wk            Contractures Contractures Info: Not present    Additional Factors Info  Code Status Code Status Info: FULL             Current Medications (03/15/2015):  This is the current hospital active medication list Current Facility-Administered Medications  Medication Dose Route Frequency Provider Last Rate Last Dose  . 0.9 %  sodium chloride infusion   Intravenous Continuous Arlee Muslim, PA-C 30 mL/hr at 03/15/15 [phone]    . acetaminophen (TYLENOL) tablet 650 mg  650 mg Oral Q6H PRN Gaynelle Arabian, MD       Or  . acetaminophen (TYLENOL) suppository 650 mg  650 mg Rectal Q6H PRN Gaynelle Arabian, MD      . acetaminophen (TYLENOL) tablet 1,000 mg  1,000 mg Oral Q6H Gaynelle Arabian, MD   1,000 mg at 03/15/15 [id number]  . bisacodyl (DULCOLAX) suppository 10 mg  10 mg Rectal Daily PRN Gaynelle Arabian, MD      . buPROPion [facility] SR) 12 hr tablet 200 mg  200 mg Oral BID Gaynelle Arabian, MD   200 mg at 03/15/15 1031  . carvedilol (COREG) tablet 12.5 mg  12.5 mg Oral BID Gaynelle Arabian, MD   12.5 mg at 03/15/15 1032  . cycloSPORINE (RESTASIS) 0.05 % ophthalmic emulsion 1 drop  1 drop Both Eyes BID Gaynelle Arabian, MD  1 drop at 03/15/15 1030  . diphenhydrAMINE (BENADRYL) 12.5 MG/5ML elixir 12.5-25 mg  12.5-25 mg Oral Q4H PRN Gaynelle Arabian, MD      . docusate sodium (COLACE) capsule 100 mg  100 mg Oral BID Gaynelle Arabian, MD   100 mg at 03/15/15 1031  . furosemide (LASIX) tablet 40 mg  40 mg Oral Daily Gaynelle Arabian, MD   40 mg at 03/15/15 1032  . menthol-cetylpyridinium (CEPACOL) lozenge 3 mg  1 lozenge Oral PRN Gaynelle Arabian, MD       Or  . phenol (CHLORASEPTIC) mouth spray 1 spray  1 spray Mouth/Throat PRN Gaynelle Arabian, MD      . methocarbamol (ROBAXIN) tablet 500 mg  500 mg Oral Q6H PRN Gaynelle Arabian, MD   500 mg at 03/15/15 [id number]   Or  . methocarbamol (ROBAXIN) 500 mg in dextrose 5 % 50 mL IVPB  500 mg Intravenous Q6H PRN  Gaynelle Arabian, MD   500 mg at 03/14/15 1728  . metoCLOPramide (REGLAN) tablet 5-10 mg  5-10 mg Oral Q8H PRN Gaynelle Arabian, MD       Or  . metoCLOPramide (REGLAN) injection 5-10 mg  5-10 mg Intravenous Q8H PRN Gaynelle Arabian, MD      . morphine 2 MG/ML injection 1 mg  1 mg Intravenous Q2H PRN Gaynelle Arabian, MD   1 mg at 03/14/15 1848  . ondansetron (ZOFRAN) tablet 4 mg  4 mg Oral Q6H PRN Gaynelle Arabian, MD       Or  . ondansetron [facility]) injection 4 mg  4 mg Intravenous Q6H PRN Gaynelle Arabian, MD      . oxyCODONE (Oxy IR/ROXICODONE) immediate release tablet 5-10 mg  5-10 mg Oral Q3H PRN Gaynelle Arabian, MD   10 mg at 03/15/15 1201  . polyethylene glycol (MIRALAX / GLYCOLAX) packet 17 g  17 g Oral Daily PRN Gaynelle Arabian, MD      . rivaroxaban [facility]) tablet 10 mg  10 mg Oral Q breakfast Gaynelle Arabian, MD   10 mg at 03/15/15 [id number]  . sodium phosphate (FLEET) 7-19 GM/118ML enema 1 enema  1 enema Rectal Once PRN Gaynelle Arabian, MD      . timolol (TIMOPTIC) 0.5 % ophthalmic solution 1 drop  1 drop Both Eyes BID Gaynelle Arabian, MD   1 drop at 03/15/15 1031  . traMADol (ULTRAM) tablet 50-100 mg  50-100 mg Oral Q6H PRN Gaynelle Arabian, MD         Discharge Medications: Please see discharge summary for a list of discharge medications.  Relevant Imaging Results:  Relevant Lab Results:   Additional Information SS # [id number]  Arron Tetrault, Randall An, LCSW

## 2015-03-15 NOTE — Clinical Social Work Placement (Signed)
   CLINICAL SOCIAL WORK PLACEMENT  NOTE  Date:  03/15/2015  Patient Details  Name: [name] MRN: [id number] Date of Birth: [birth year]  Clinical Social Work is seeking post-discharge placement for this patient at the [place] level of care (*CSW will initial, date and re-position this form in  chart as items are completed):  No   Patient/family provided with [place] Work Department's list of facilities offering this level of care within the geographic area requested by the patient (or if unable, by the patient's family).  Yes   Patient/family informed of their freedom to choose among providers that offer the needed level of care, that participate in Medicare, Medicaid or managed care program needed by the patient, have an available bed and are willing to accept the patient.  Yes   Patient/family informed of [facility]'s ownership interest in [facility] and [facility], as well as of the fact that they are under no obligation to receive care at these facilities.  PASRR submitted to EDS on       PASRR number received on       Existing PASRR number confirmed on 03/15/15     FL2 transmitted to all facilities in geographic area requested by pt/family on 03/15/15     FL2 transmitted to all facilities within larger geographic area on       Patient informed that his/her managed care company has contracts with or will negotiate with certain facilities, including the following:        Yes   Patient/family informed of bed offers received.  Patient chooses bed at [facility]     Physician recommends and patient chooses bed at [facility]    Patient to be transferred to [facility] on  .  Patient to be transferred to facility by       Patient family notified on   of transfer.  Name of family member notified:        PHYSICIAN       Additional Comment:    _______________________________________________ [name], [place] 03/15/2015, 4:26 PM

## 2015-03-15 NOTE — Evaluation (Signed)
Physical Therapy Evaluation Patient Details Name: [name] MRN: [id number] DOB: Jun 03, 1935 Today's Date: 03/15/2015   History of Present Illness  s/p R TKA  Clinical Impression  Pt is s/p TKA resulting in the deficits listed below (see PT Problem List).   Pt will benefit from skilled PT to increase their independence and safety with mobility to allow discharge to the venue listed below. Plan is for SNF at [facility], will continue to follow     Follow Up Recommendations SNF    Equipment Recommendations  None recommended by PT    Recommendations for Other Services       Precautions / Restrictions Precautions Precautions: Knee Restrictions Weight Bearing Restrictions: No Other Position/Activity Restrictions: WBAT      Mobility  Bed Mobility Overal bed mobility: Needs Assistance Bed Mobility: Supine to Sit     Supine to sit: Min assist     General bed mobility comments: cues for technique and to self assist, assist needed with RLE  Transfers Overall transfer level: Needs assistance Equipment used: Rolling walker (2 wheeled) Transfers: Sit to/from Stand Sit to Stand: Min assist         General transfer comment: assist to rise and stabilize, cues for hand placmement and RL E position  Ambulation/Gait Ambulation/Gait assistance: Min assist Ambulation Distance (Feet): 30 Feet (in room, pt would not go in hallway) Assistive device: Rolling walker (2 wheeled) Gait Pattern/deviations: Step-to pattern;Trunk flexed;Antalgic;Decreased step length - right;Decreased step length - left Gait velocity: decr   General Gait Details: cues for sequence, RW safety  Stairs            Wheelchair Mobility    Modified Rankin (Stroke Patients Only)       Balance Overall balance assessment: Needs assistance           Standing balance-Leahy Scale: Poor                               Pertinent Vitals/Pain Pain Assessment: 0-10 Pain Location:  right knee Pain Descriptors / Indicators: Aching;Sore;Shooting Pain Intervention(s): Limited activity within patient's tolerance;Monitored during session;Premedicated before session;Repositioned;Ice applied    Home Living Family/patient expects to be discharged to:: Skilled nursing facility Living Arrangements: Alone                    Prior Function Level of Independence: Independent;Independent with assistive device(s)         Comments: amb with RW sometimes     Hand Dominance        Extremity/Trunk Assessment   Upper Extremity Assessment: Overall WFL for tasks assessed           Lower Extremity Assessment: RLE deficits/detail RLE Deficits / Details: ankle WFL, knee flexion to 45* AA, knee extension and hip flexion 2+/5    Cervical / Trunk Assessment: Kyphotic  Communication   Communication: No difficulties  Cognition Arousal/Alertness: Awake/alert Behavior During Therapy: WFL for tasks assessed/performed Overall Cognitive Status: Within Functional Limits for tasks assessed                      General Comments      Exercises Total Joint Exercises Ankle Circles/Pumps: AROM;Both;10 reps Quad Sets: 10 reps;Right;AROM;Strengthening Heel Slides: AAROM;Right;10 reps      Assessment/Plan    PT Assessment Patient needs continued PT services  PT Diagnosis Difficulty walking   PT Problem List Decreased strength;Decreased range of motion;Decreased activity  tolerance;Decreased mobility;Pain  PT Treatment Interventions DME instruction;Gait training;Functional mobility training;Therapeutic activities;Therapeutic exercise;Patient/family education   PT Goals (Current goals can be found in the Care Plan section) Acute Rehab PT Goals Patient Stated Goal: to go to rehab PT Goal Formulation: With patient Time For Goal Achievement: 03/22/15 Potential to Achieve Goals: Good    Frequency 7X/week   Barriers to discharge        Co-evaluation                End of Session Equipment Utilized During Treatment: Gait belt Activity Tolerance: Patient tolerated treatment well Patient left: with call bell/phone within reach;in chair           Time: [id number] PT Time Calculation (min) (ACUTE ONLY): 25 min   Charges:   PT Evaluation $PT Eval Low Complexity: 1 Procedure PT Treatments $Gait Training: 8-22 mins   PT G Codes:        Malissia Rabbani 09-Apr-2015, 10:20 AM

## 2015-03-16 LAB — CBC
HCT: 33.6 % — ABNORMAL LOW (ref 36.0–46.0)
Hemoglobin: 11.3 g/dL — ABNORMAL LOW (ref 12.0–15.0)
MCH: 32.2 pg (ref 26.0–34.0)
MCHC: 33.6 g/dL (ref 30.0–36.0)
MCV: 95.7 fL (ref 78.0–100.0)
Platelets: 184 10*3/uL (ref 150–400)
RBC: 3.51 MIL/uL — ABNORMAL LOW (ref 3.87–5.11)
RDW: 13.5 % (ref 11.5–15.5)
WBC: 8.7 10*3/uL (ref 4.0–10.5)

## 2015-03-16 LAB — BASIC METABOLIC PANEL
Anion gap: 6 (ref 5–15)
BUN: 16 mg/dL (ref 4–21)
BUN: 16 mg/dL (ref 6–20)
CO2: 24 mmol/L (ref 22–32)
Calcium: 8.6 mg/dL — ABNORMAL LOW (ref 8.9–10.3)
Chloride: 107 mmol/L (ref 101–111)
Creatinine, Ser: 0.71 mg/dL (ref 0.44–1.00)
Creatinine: 0.7 mg/dL (ref 0.5–1.1)
GFR calc Af Amer: >60 mL/min (ref >60)
GFR calc non Af Amer: >60 mL/min (ref >60)
Glucose, Bld: 152 mg/dL — ABNORMAL HIGH (ref 65–99)
Glucose: 152 mg/dL
Potassium: 4.2 mmol/L (ref 3.5–5.1)
Sodium: 137 mmol/L (ref 135–145)
Sodium: 137 mmol/L (ref 137–147)

## 2015-03-16 NOTE — Progress Notes (Signed)
   Subjective: 2 Days Post-Op Procedure(s) (LRB): TOTAL KNEE ARTHROPLASTY (Right) Patient reports pain as mild and moderate.   Patient seen in rounds for Dr. Wynelle Link. Patient is well, but has had some minor complaints of pain in the knee, requiring pain medications Plan is to go to [facility] after hospital stay.  Objective: Vital signs in last 24 hours: Temp:  [98.1 F (36.7 C)-98.4 F (36.9 C)] 98.1 F (36.7 C) (02/15 1343) Pulse Rate:  [60-65] 60 (02/15 1343) Resp:  [16] 16 (02/15 1343) BP: (132-161)/(62-79) 132/79 mmHg (02/15 1343) SpO2:  [97 %-99 %] 99 % (02/15 1343)  Intake/Output from previous day:  Intake/Output Summary (Last 24 hours) at 03/16/15 1701 Last data filed at 03/16/15 0523  Gross per 24 hour  Intake    480 ml  Output    525 ml  Net    -45 ml    Labs:  Recent Labs  03/15/15 0418 03/16/15 0407  HGB 12.0 11.3*    Recent Labs  03/15/15 0418 03/16/15 0407  WBC 7.2 8.7  RBC 3.73* 3.51*  HCT 35.4* 33.6*  PLT 171 184    Recent Labs  03/15/15 0418 03/16/15 0407  NA 136 137  K 4.1 4.2  CL 106 107  CO2 22 24  BUN 20 16  CREATININE 1.00 0.71  GLUCOSE 179* 152*  CALCIUM 8.6* 8.6*   No results for input(s): LABPT, INR in the last 72 hours.  EXAM General - Patient is Alert, Appropriate and Oriented Extremity - Neurovascular intact Sensation intact distally Dorsiflexion/Plantar flexion intact Dressing/Incision - clean, dry, no drainage Motor Function - intact, moving foot and toes well on exam.   Past Medical History  Diagnosis Date  . Hypertension   . Arthritis   . Osteoporosis   . Headache(784.0)   . Depression   . Glaucoma   . Degenerative arthritis of hip     s/p THR 05/2011  . OAB (overactive bladder)     Assessment/Plan: 2 Days Post-Op Procedure(s) (LRB): TOTAL KNEE ARTHROPLASTY (Right) Principal Problem:   OA (osteoarthritis) of knee  Estimated body mass index is 31.12 kg/(m^2) as calculated from the following:  Height as of this encounter: 5\' 5"  (1.651 m).   Weight as of this encounter: 84.823 kg (187 lb). Up with therapy Plan for discharge tomorrow Discharge to SNF  DVT Prophylaxis - Xarelto Weight-Bearing as tolerated to right leg HGB 11.3 postop today  Arlee Muslim, PA-C Orthopaedic Surgery 03/16/2015, 5:01 PM

## 2015-03-16 NOTE — Progress Notes (Signed)
Physical Therapy Treatment Patient Details Name: [name] MRN: [id number] DOB: [birth year] Today's Date: 04-10-2015    History of Present Illness s/p R TKA    PT Comments    Pt progressing well;  Making excellent gains with gait/activity tolerance and R knee ROM today  Follow Up Recommendations  SNF     Equipment Recommendations  None recommended by PT    Recommendations for Other Services       Precautions / Restrictions Precautions Precautions: Knee Precaution Comments: pt able to do I SLR Required Braces or Orthoses: Knee Immobilizer - Right Knee Immobilizer - Right: Discontinue once straight leg raise with < 10 degree lag Restrictions Weight Bearing Restrictions: No Other Position/Activity Restrictions: WBAT    Mobility  Bed Mobility               General bed mobility comments: pt in chair  Transfers Overall transfer level: Needs assistance Equipment used: Rolling walker (2 wheeled) Transfers: Sit to/from Stand Sit to Stand: Min guard         General transfer comment: cues for hand placement and control of descent  Ambulation/Gait Ambulation/Gait assistance: Min guard;Min assist Ambulation Distance (Feet): 60 Feet   Gait Pattern/deviations: Step-to pattern;Trunk flexed Gait velocity: decr   General Gait Details: multi-modal cues for sequence, RW safety, posture, shoulder depression   Stairs            Wheelchair Mobility    Modified Rankin (Stroke Patients Only)       Balance                                    Cognition Arousal/Alertness: Awake/alert Behavior During Therapy: WFL for tasks assessed/performed Overall Cognitive Status: Within Functional Limits for tasks assessed                      Exercises Total Joint Exercises Ankle Circles/Pumps: AROM;Both;10 reps Quad Sets: 10 reps;Right;AROM;Strengthening Heel Slides: AAROM;Right;10 reps Hip ABduction/ADduction: AAROM;Strengthening;Right;10  reps Straight Leg Raises: AROM;Right;10 reps    General Comments        Pertinent Vitals/Pain Pain Assessment: 0-10 Pain Score: 4  Pain Descriptors / Indicators: Sore Pain Intervention(s): Limited activity within patient's tolerance;Monitored during session;Premedicated before session;Ice applied    Home Living                      Prior Function            PT Goals (current goals can now be found in the care plan section) Acute Rehab PT Goals Patient Stated Goal: to go to rehab PT Goal Formulation: With patient Time For Goal Achievement: 03/22/15 Potential to Achieve Goals: Good Progress towards PT goals: Progressing toward goals    Frequency  7X/week    PT Plan Current plan remains appropriate    Co-evaluation             End of Session Equipment Utilized During Treatment: Gait belt;Right knee immobilizer Activity Tolerance: Patient tolerated treatment well Patient left: in chair;with call bell/phone within reach     Time: 0945-1008 PT Time Calculation (min) (ACUTE ONLY): 23 min  Charges:  $Gait Training: 8-22 mins $Therapeutic Exercise: 8-22 mins                    G Codes:      Javarus Dorner April 10, 2015, 10:51 AM

## 2015-03-17 DIAGNOSIS — R278 Other lack of coordination: Secondary | ICD-10-CM | POA: Diagnosis not present

## 2015-03-17 DIAGNOSIS — M25569 Pain in unspecified knee: Secondary | ICD-10-CM | POA: Diagnosis not present

## 2015-03-17 DIAGNOSIS — F329 Major depressive disorder, single episode, unspecified: Secondary | ICD-10-CM | POA: Diagnosis not present

## 2015-03-17 DIAGNOSIS — M1711 Unilateral primary osteoarthritis, right knee: Secondary | ICD-10-CM | POA: Diagnosis not present

## 2015-03-17 DIAGNOSIS — M1991 Primary osteoarthritis, unspecified site: Secondary | ICD-10-CM | POA: Diagnosis not present

## 2015-03-17 DIAGNOSIS — K5900 Constipation, unspecified: Secondary | ICD-10-CM | POA: Diagnosis not present

## 2015-03-17 DIAGNOSIS — R2681 Unsteadiness on feet: Secondary | ICD-10-CM | POA: Diagnosis not present

## 2015-03-17 DIAGNOSIS — R600 Localized edema: Secondary | ICD-10-CM | POA: Diagnosis not present

## 2015-03-17 DIAGNOSIS — H409 Unspecified glaucoma: Secondary | ICD-10-CM | POA: Diagnosis not present

## 2015-03-17 DIAGNOSIS — E785 Hyperlipidemia, unspecified: Secondary | ICD-10-CM | POA: Diagnosis not present

## 2015-03-17 DIAGNOSIS — Z4789 Encounter for other orthopedic aftercare: Secondary | ICD-10-CM | POA: Diagnosis not present

## 2015-03-17 DIAGNOSIS — I447 Left bundle-branch block, unspecified: Secondary | ICD-10-CM | POA: Diagnosis not present

## 2015-03-17 DIAGNOSIS — I10 Essential (primary) hypertension: Secondary | ICD-10-CM | POA: Diagnosis not present

## 2015-03-17 DIAGNOSIS — M6281 Muscle weakness (generalized): Secondary | ICD-10-CM | POA: Diagnosis not present

## 2015-03-17 DIAGNOSIS — Z96651 Presence of right artificial knee joint: Secondary | ICD-10-CM | POA: Diagnosis not present

## 2015-03-17 DIAGNOSIS — R262 Difficulty in walking, not elsewhere classified: Secondary | ICD-10-CM | POA: Diagnosis not present

## 2015-03-17 DIAGNOSIS — K5901 Slow transit constipation: Secondary | ICD-10-CM | POA: Diagnosis not present

## 2015-03-17 DIAGNOSIS — I5030 Unspecified diastolic (congestive) heart failure: Secondary | ICD-10-CM | POA: Diagnosis not present

## 2015-03-17 LAB — CBC
HCT: 36.6 % (ref 36.0–46.0)
Hemoglobin: 12.3 g/dL (ref 12.0–15.0)
MCH: 32.6 pg (ref 26.0–34.0)
MCHC: 33.6 g/dL (ref 30.0–36.0)
MCV: 97.1 fL (ref 78.0–100.0)
Platelets: 195 10*3/uL (ref 150–400)
RBC: 3.77 MIL/uL — ABNORMAL LOW (ref 3.87–5.11)
RDW: 13.9 % (ref 11.5–15.5)
WBC: 7 10*3/uL (ref 4.0–10.5)

## 2015-03-17 LAB — CBC AND DIFFERENTIAL: WBC: 7 10^3/mL

## 2015-03-17 NOTE — Clinical Social Work Placement (Addendum)
   CLINICAL SOCIAL WORK PLACEMENT  NOTE  Date:  03/17/2015  Patient Details  Name: [name] MRN: [id number] Date of Birth: 1935/09/03  Clinical Social Work is seeking post-discharge placement for this patient at the [place] level of care (*CSW will initial, date and re-position this form in  chart as items are completed):  No   Patient/family provided with [place] Work Department's list of facilities offering this level of care within the geographic area requested by the patient (or if unable, by the patient's family).  Yes   Patient/family informed of their freedom to choose among providers that offer the needed level of care, that participate in Medicare, Medicaid or managed care program needed by the patient, have an available bed and are willing to accept the patient.  Yes   Patient/family informed of [place]'s ownership interest in [facility] and [facility], as well as of the fact that they are under no obligation to receive care at these facilities.  PASRR submitted to EDS on       PASRR number received on       Existing PASRR number confirmed on 03/15/15     FL2 transmitted to all facilities in geographic area requested by pt/family on 03/15/15     FL2 transmitted to all facilities within larger geographic area on       Patient informed that his/her managed care company has contracts with or will negotiate with certain facilities, including the following:        Yes   Patient/family informed of bed offers received.  Patient chooses bed at [facility]     Physician recommends and patient chooses bed at [facility]    Patient to be transferred to [facility] on 03/17/15.  Patient to be transferred to facility by [facility]     Patient family notified on 03/17/15 of transfer.  Name of family member notified:  Pt contacted son directly.     PHYSICIAN       Additional Comment: Pt is in agreement with d/c to [facility] today. [facility] transport required. Pt is aware out of pocket costs may be associated with [facility] transport. Medical necessity form completed. D/C Summary sent to SNF for review. Scripts included in d/c packet. # for report provided to nsg.   _______________________________________________ [name], [place]  [phone] 03/17/2015, 4:10 PM

## 2015-03-17 NOTE — Progress Notes (Signed)
Pt d/c to [facility]. Patient being transport by [facility]. Report given to Lavella Lemons, Midwife.

## 2015-03-17 NOTE — Progress Notes (Signed)
   Subjective: 3 Days Post-Op Procedure(s) (LRB): TOTAL KNEE ARTHROPLASTY (Right) Patient reports pain as mild.   Patient seen in rounds by Dr. Wynelle Link.  Feeling better today.  Did better with therapy yesterday. Patient is well, but has had some minor complaints of pain in the knee, requiring pain medications Patient is ready to go to the SNF today, [facility].  Objective: Vital signs in last 24 hours: Temp:  [98.1 F (36.7 C)-98.4 F (36.9 C)] 98.4 F (36.9 C) (02/16 0645) Pulse Rate:  [60-68] 66 (02/16 0645) Resp:  [16] 16 (02/16 0645) BP: (132-181)/(68-79) 181/72 mmHg (02/16 0645) SpO2:  [94 %-99 %] 96 % (02/16 0645)  Intake/Output from previous day:  Intake/Output Summary (Last 24 hours) at 03/17/15 0737 Last data filed at 03/17/15 0645  Gross per 24 hour  Intake    480 ml  Output      0 ml  Net    480 ml   Labs:  Recent Labs  03/15/15 0418 03/16/15 0407 03/17/15 0416  HGB 12.0 11.3* 12.3    Recent Labs  03/16/15 0407 03/17/15 0416  WBC 8.7 7.0  RBC 3.51* 3.77*  HCT 33.6* 36.6  PLT 184 195    Recent Labs  03/15/15 0418 03/16/15 0407  NA 136 137  K 4.1 4.2  CL 106 107  CO2 22 24  BUN 20 16  CREATININE 1.00 0.71  GLUCOSE 179* 152*  CALCIUM 8.6* 8.6*   No results for input(s): LABPT, INR in the last 72 hours.  EXAM: General - Patient is Alert and Oriented Extremity - Neurovascular intact Sensation intact distally Dorsiflexion/Plantar flexion intact Incision - clean, dry, no drainage Motor Function - intact, moving foot and toes well on exam.   Assessment/Plan: 3 Days Post-Op Procedure(s) (LRB): TOTAL KNEE ARTHROPLASTY (Right) Procedure(s) (LRB): TOTAL KNEE ARTHROPLASTY (Right) Past Medical History  Diagnosis Date  . Hypertension   . Arthritis   . Osteoporosis   . Headache(784.0)   . Depression   . Glaucoma   . Degenerative arthritis of hip     s/p THR 05/2011  . OAB (overactive bladder)    Principal Problem:   OA  (osteoarthritis) of knee  Estimated body mass index is 31.12 kg/(m^2) as calculated from the following:   Height as of this encounter: 5\' 5"  (1.651 m).   Weight as of this encounter: 84.823 kg (187 lb). Up with therapy Discharge to SNF - [facility] Diet - Cardiac diet Follow up - in 1 week, next Thursday 03/24/2015 Activity - WBAT Disposition - Skilled nursing facility Condition Upon Discharge - Good D/C Meds - See DC Summary DVT Prophylaxis - Xarelto  Arlee Muslim, PA-C Orthopaedic Surgery 03/17/2015, 7:37 AM

## 2015-03-18 ENCOUNTER — Encounter: Payer: Self-pay | Admitting: Adult Health

## 2015-03-18 ENCOUNTER — Non-Acute Institutional Stay (SKILLED_NURSING_FACILITY): Payer: Medicare Other | Admitting: Adult Health

## 2015-03-18 DIAGNOSIS — E785 Hyperlipidemia, unspecified: Secondary | ICD-10-CM

## 2015-03-18 DIAGNOSIS — H409 Unspecified glaucoma: Secondary | ICD-10-CM | POA: Diagnosis not present

## 2015-03-18 DIAGNOSIS — I10 Essential (primary) hypertension: Secondary | ICD-10-CM

## 2015-03-18 DIAGNOSIS — F329 Major depressive disorder, single episode, unspecified: Secondary | ICD-10-CM

## 2015-03-18 DIAGNOSIS — R600 Localized edema: Secondary | ICD-10-CM

## 2015-03-18 DIAGNOSIS — M1711 Unilateral primary osteoarthritis, right knee: Secondary | ICD-10-CM | POA: Diagnosis not present

## 2015-03-18 DIAGNOSIS — K5901 Slow transit constipation: Secondary | ICD-10-CM | POA: Diagnosis not present

## 2015-03-18 DIAGNOSIS — F32A Depression, unspecified: Secondary | ICD-10-CM

## 2015-03-18 NOTE — Progress Notes (Deleted)
Patient ID: [name], female   DOB: 11/24/1935, 80 y.o.   MRN: [id number]    DATE:  03/18/15  MRN:  [id number]  BIRTHDAY: 1935/02/04  Facility:  Nursing Home Location:  [place] Room Number: 1205-P  LEVEL OF CARE:  SNF [phone])  Contact Information    Name Relation Home Work [place] Son [phone]     Maddox,Alice Relative [id number]     Dionne Milo [phone]         Code Status History    Date Active Date Inactive Code Status Order ID Comments User Context   06/01/2011  2:00 PM 06/05/2011  1:58 PM Full Code [id number]  Yvonna Alanis, RN Inpatient       Chief Complaint  Patient presents with  . Hospitalization Follow-up    HISTORY OF PRESENT ILLNESS:  This is a 80 year old female who has been admitted to [facility] on 03/17/15 from [facility]. She has PMH of hypertension, osteoporosis, headache, depression and OAB. She has osteoarthritis of right knee for which she had right total knee arthroplasty on 03/14/15. She has been admitted for a short-term rehabilitation.  PAST MEDICAL HISTORY:  Past Medical History  Diagnosis Date  . Hypertension   . Arthritis   . Osteoporosis   . Headache(784.0)   . Depression   . Glaucoma   . Degenerative arthritis of hip     s/p THR 05/2011  . OAB (overactive bladder)      CURRENT MEDICATIONS: Reviewed  Patient's Medications  New Prescriptions   No medications on file  Previous Medications   ACETAMINOPHEN (TYLENOL) 325 MG TABLET    Take 2 tablets (650 mg total) by mouth every 6 (six) hours as needed for mild pain (or Fever >/= 101).   ASPIRIN EC 81 MG TABLET    Take 81 mg by mouth daily. Give for 3 more weeks, stop 04/26/15   BISACODYL (DULCOLAX) 10 MG SUPPOSITORY    Place 1 suppository (10 mg total) rectally daily as needed for moderate constipation.   CARVEDILOL (COREG) 12.5 MG TABLET    take 1 tablet by mouth twice a day   COLESTIPOL (COLESTID) 1 G TABLET    Take 2  tablets (2 g total) by mouth 2 (two) times daily.   DIPHENHYDRAMINE (BENADRYL) 12.5 MG/5ML ELIXIR    Take 5-10 mLs (12.5-25 mg total) by mouth every 4 (four) hours as needed for itching.   DOCUSATE SODIUM (COLACE) 100 MG CAPSULE    Take 1 capsule (100 mg total) by mouth 2 (two) times daily.   FUROSEMIDE (LASIX) 40 MG TABLET    take 1 tablet by mouth once daily   LISINOPRIL (PRINIVIL,ZESTRIL) 30 MG TABLET    take 1 tablet by mouth once daily   METHOCARBAMOL (ROBAXIN) 500 MG TABLET    Take 1 tablet (500 mg total) by mouth every 6 (six) hours as needed for muscle spasms.   METOCLOPRAMIDE (REGLAN) 5 MG TABLET    Take 1 tablet (5 mg total) by mouth every 8 (eight) hours as needed for nausea (if ondansetron (ZOFRAN) ineffective.).   ONDANSETRON (ZOFRAN) 4 MG TABLET    Take 1 tablet (4 mg total) by mouth every 6 (six) hours as needed for nausea.   OXYCODONE (OXY IR/ROXICODONE) 5 MG IMMEDIATE RELEASE TABLET    Take 1-2 tablets (5-10 mg total) by mouth every 3 (three) hours as needed for moderate pain or severe pain.   POLYETHYLENE GLYCOL (  MIRALAX / GLYCOLAX) PACKET    Take 17 g by mouth daily as needed for mild constipation.   RESTASIS 0.05 % OPHTHALMIC EMULSION    Place 1 drop into both eyes daily.   RIVAROXABAN (XARELTO) 10 MG TABS TABLET    Take 1 tablet (10 mg total) by mouth daily with breakfast. Take Xarelto for two and a half more weeks, then discontinue Xarelto. Once the patient has completed the blood thinner regimen, then take a Baby 81 mg Aspirin daily for three more weeks.   SODIUM PHOSPHATE (FLEET) 7-19 GM/118ML ENEM    Place 133 mLs (1 enema total) rectally once as needed for severe constipation.   TIMOLOL (TIMOPTIC) 0.5 % OPHTHALMIC SOLUTION    Place 1 drop into both eyes 2 (two) times daily.   TRAMADOL (ULTRAM) 50 MG TABLET    Take 1-2 tablets (50-100 mg total) by mouth every 6 (six) hours as needed (mild pain).  Modified Medications   No medications on file  Discontinued Medications    BUPROPION (WELLBUTRIN SR) 150 MG 12 HR TABLET    take 1 tablet by mouth twice a day   BUPROPION (WELLBUTRIN SR) 200 MG 12 HR TABLET    Take 200 mg by mouth 2 (two) times daily.     No Known Allergies   REVIEW OF SYSTEMS:  GENERAL: no change in appetite, no fatigue, no weight changes, no fever, chills or weakness EYES: Denies change in vision, dry eyes, eye pain, itching or discharge EARS: Denies change in hearing, ringing in ears, or earache NOSE: Denies nasal congestion or epistaxis MOUTH and THROAT: Denies oral discomfort, gingival pain or bleeding, pain from teeth or hoarseness   RESPIRATORY: no cough, SOB, DOE, wheezing, hemoptysis CARDIAC: no chest pain, edema or palpitations GI: no abdominal pain, diarrhea, constipation, heart burn, nausea or vomiting GU: Denies dysuria, frequency, hematuria, incontinence, or discharge PSYCHIATRIC: Denies feeling of depression or anxiety. No report of hallucinations, insomnia, paranoia, or agitation   PHYSICAL EXAMINATION  GENERAL APPEARANCE: Well nourished. In no acute distress. Normal body habitus SKIN:  Right knee surgical incision is covered with dry dressing, no erythema  HEAD: Normal in size and contour. No evidence of trauma EYES: Lids open and close normally. No blepharitis, entropion or ectropion. PERRL. Conjunctivae are clear and sclerae are white. Lenses are without opacity EARS: Pinnae are normal. Patient hears normal voice tunes of the examiner MOUTH and THROAT: Lips are without lesions. Oral mucosa is moist and without lesions. Tongue is normal in shape, size, and color and without lesions NECK: supple, trachea midline, no neck masses, no thyroid tenderness, no thyromegaly LYMPHATICS: no LAN in the neck, no supraclavicular LAN RESPIRATORY: breathing is even & unlabored, BS CTAB CARDIAC: RRR, no murmur,no extra heart sounds, no edema GI: abdomen soft, normal BS, no masses, no tenderness, no hepatomegaly, no  splenomegaly EXTREMITIES:  Able to move 4 extremities PSYCHIATRIC: Alert and oriented X 3. Affect and behavior are appropriate  LABS/RADIOLOGY: Labs reviewed: Basic Metabolic Panel:  Recent Labs  03/09/15 1500 03/15/15 0418 03/16/15 0407  NA 137 136 137  K 4.1 4.1 4.2  CL 102 106 107  CO2 28 22 24   GLUCOSE 90 179* 152*  BUN 21* 20 16  CREATININE 1.13* 1.00 0.71  CALCIUM 9.7 8.6* 8.6*   Liver Function Tests:  Recent Labs  03/09/15 1500  AST 23  ALT 22  ALKPHOS 95  BILITOT 0.4  PROT 7.1  ALBUMIN 4.0   CBC:  Recent Labs  03/15/15 0418 03/16/15 0407 03/17/15 0416  WBC 7.2 8.7 7.0  HGB 12.0 11.3* 12.3  HCT 35.4* 33.6* 36.6  MCV 94.9 95.7 97.1  PLT 171 184 195    ASSESSMENT/PLAN:  Osteoarthritis S/P right total knee arthroplasty - for rehabilitation; RLE WBAT; continue OxyIR 5 mg 1-2 tabs every 3 hours when necessary, Tylenol 325 mg 2 tabs = 650 mg by mouth every 6 hours when necessary and tramadol 50 mg 1-2 tabs by mouth every 6 hours when necessary for pain; Robaxin 500 mg 1 tab by mouth every 6 hours when necessary for muscle spasm; Xarelto 10 mg 1 tab by mouth daily until 04/04/15 then start aspirin 81 mg 1 tab by mouth daily 3 weeks for DVT prophylaxis; follow-up with Dr. Wynelle Link, orthopedic surgeon, on 03/24/15; check CBC  Hyperlipidemia - she has requested colestipol to be discontinued  Hypertension - continue Coreg 12.5 mg 1 tab by mouth twice a day, Lasix 40 mg 1 tab by mouth daily and lisinopril 30 mg 1 tab by mouth daily  Depression - mood is stable; continue Wellbutrin SR 200 mg 1 tab by mouth twice a day  BLE edema - no edema noted; patient complains of having to go to the bathroom to urinate several times and is requesting for Lasix to be decreased; decrease Lasix from 40 mg to 20 mg 1 tab by mouth daily; check BMP  Constipation - continue Dulcolax suppository 10 mg 1 per rectum daily when necessary, Colace 100 mg 1 capsule by mouth twice a day and  MiraLAX 17 g by mouth daily when necessary  Glaucoma - no complaints of eye pain; continue Restasis eyedrops and Timoptic drops    Goals of care:  Short-term rehabilitation    [facility], NP [facility] [phone]

## 2015-03-21 ENCOUNTER — Encounter: Payer: Self-pay | Admitting: Internal Medicine

## 2015-03-21 ENCOUNTER — Non-Acute Institutional Stay (SKILLED_NURSING_FACILITY): Payer: Medicare Other | Admitting: Internal Medicine

## 2015-03-21 DIAGNOSIS — F329 Major depressive disorder, single episode, unspecified: Secondary | ICD-10-CM

## 2015-03-21 DIAGNOSIS — I10 Essential (primary) hypertension: Secondary | ICD-10-CM

## 2015-03-21 DIAGNOSIS — H409 Unspecified glaucoma: Secondary | ICD-10-CM

## 2015-03-21 DIAGNOSIS — M1711 Unilateral primary osteoarthritis, right knee: Secondary | ICD-10-CM | POA: Diagnosis not present

## 2015-03-21 DIAGNOSIS — K5900 Constipation, unspecified: Secondary | ICD-10-CM | POA: Diagnosis not present

## 2015-03-21 DIAGNOSIS — F32A Depression, unspecified: Secondary | ICD-10-CM

## 2015-03-21 DIAGNOSIS — R2681 Unsteadiness on feet: Secondary | ICD-10-CM

## 2015-03-21 DIAGNOSIS — R600 Localized edema: Secondary | ICD-10-CM | POA: Diagnosis not present

## 2015-03-21 NOTE — Progress Notes (Signed)
Patient ID: [name], female   DOB: [birth year], 80 y.o.   MRN: [id number]    LOCATION: [place]  PCP: Criselda Peaches, MD   Code Status: Full Code  Goals of care: Advanced Directive information Advanced Directives 03/14/2015  Does patient have an advance directive? Yes  Type of Advance Directive Living will  Does patient want to make changes to advanced directive? No - Patient declined  Copy of advanced directive(s) in chart? No - copy requested    Extended Emergency Contact Information Primary Emergency Contact: [facility] Address: [place], [place] [address] [place] Phone: [phone] Relation: Son Secondary Emergency Contact: Maddox,Alice Address: [place], [place] [address] [place] Phone: [phone] Relation: Relative   No Known Allergies  Chief Complaint  Patient presents with  . New Admit To SNF    New Admission     HPI:  Patient is a 80 y.o. female seen today for short term rehabilitation post hospital admission from 03/14/15-03/17/15 with right knee arthritis. She underwent right total knee arthroplasty. She is seen in her room today. Her pain is under control with current pain regimen.   Review of Systems:  Constitutional: Negative for fever, chills, diaphoresis.  HENT: Negative for headache, congestion, nasal discharge. Eyes: Negative for blurred vision, double vision and discharge.  Respiratory: Negative for cough, shortness of breath and wheezing.   Cardiovascular: Negative for chest pain, palpitations, leg swelling.  Gastrointestinal: Negative for heartburn, nausea, vomiting, abdominal pain. Genitourinary: Negative for dysuria. Musculoskeletal: Negative for fall. Skin: Negative for itching, rash.  Neurological: Negative for dizziness Psychiatric/Behavioral: Negative for depression   Past Medical History  Diagnosis Date  . Hypertension   . Arthritis    . Osteoporosis   . Headache(784.0)   . Depression   . Glaucoma   . Degenerative arthritis of hip     s/p THR 05/2011  . OAB (overactive bladder)    Past Surgical History  Procedure Laterality Date  . Abdominal hysterectomy  2003  . Breast cysts      X2 BENIGN  .  cataracts removed  2013 L  . Total hip arthroplasty  06/01/2011    Procedure: TOTAL HIP ARTHROPLASTY ANTERIOR APPROACH;  Surgeon: Mcarthur Rossetti, MD;  Location: [facility];  Service: Orthopedics;  Laterality: Left;  Left Total Hip Replacement, Direct Anterior Approach  . Breast surgery    . Breast biopsy  1960  . Glaucoma valve insertion Bilateral 2013, 2014    [facility] ([facility])  . Total knee arthroplasty Right 03/14/2015    Procedure: TOTAL KNEE ARTHROPLASTY;  Surgeon: Gaynelle Arabian, MD;  Location: [facility];  Service: Orthopedics;  Laterality: Right;   Social History:   reports that she quit smoking about 34 years ago. She has never used smokeless tobacco. She reports that she does not drink alcohol or use illicit drugs.  Family History  Problem Relation Age of Onset  . Ovarian cancer Mother   . Alcohol abuse Father   . Stroke Brother     Medications:   Medication List       This list is accurate as of: 03/21/15 11:34 AM.  Always use your most recent med list.               acetaminophen 325 MG tablet  Commonly known as:  TYLENOL  Take 2 tablets (650  mg total) by mouth every 6 (six) hours as needed for mild pain (or Fever >/= 101).     aspirin EC 81 MG tablet  Take 81 mg by mouth daily. Give for 3 more weeks, stop 04/26/15     bisacodyl 10 MG suppository  Commonly known as:  DULCOLAX  Place 1 suppository (10 mg total) rectally daily as needed for moderate constipation.     carvedilol 12.5 MG tablet  Commonly known as:  COREG  take 1 tablet by mouth twice a day     colestipol 1 g tablet  Commonly known as:  COLESTID  Take 2 tablets (2 g total) by mouth 2 (two) times daily.     diphenhydrAMINE 12.5  MG/5ML elixir  Commonly known as:  BENADRYL  Take 5-10 mLs (12.5-25 mg total) by mouth every 4 (four) hours as needed for itching.     docusate sodium 100 MG capsule  Commonly known as:  COLACE  Take 1 capsule (100 mg total) by mouth 2 (two) times daily.     furosemide 40 MG tablet  Commonly known as:  LASIX  take 1 tablet by mouth once daily     lisinopril 30 MG tablet  Commonly known as:  PRINIVIL,ZESTRIL  take 1 tablet by mouth once daily     methocarbamol 500 MG tablet  Commonly known as:  ROBAXIN  Take 1 tablet (500 mg total) by mouth every 6 (six) hours as needed for muscle spasms.     metoCLOPramide 5 MG tablet  Commonly known as:  REGLAN  Take 1 tablet (5 mg total) by mouth every 8 (eight) hours as needed for nausea (if ondansetron (ZOFRAN) ineffective.).     ondansetron 4 MG tablet  Commonly known as:  ZOFRAN  Take 1 tablet (4 mg total) by mouth every 6 (six) hours as needed for nausea.     oxyCODONE 5 MG immediate release tablet  Commonly known as:  Oxy IR/ROXICODONE  Take 1-2 tablets (5-10 mg total) by mouth every 3 (three) hours as needed for moderate pain or severe pain.     polyethylene glycol packet  Commonly known as:  MIRALAX / GLYCOLAX  Take 17 g by mouth daily as needed for mild constipation.     RESTASIS 0.05 % ophthalmic emulsion  Generic drug:  cycloSPORINE  Place 1 drop into both eyes daily.     rivaroxaban 10 MG Tabs tablet  Commonly known as:  XARELTO  Take 1 tablet (10 mg total) by mouth daily with breakfast. Take Xarelto for two and a half more weeks, then discontinue Xarelto. Once the patient has completed the blood thinner regimen, then take a Baby 81 mg Aspirin daily for three more weeks.     sodium phosphate 7-19 GM/118ML Enem  Place 133 mLs (1 enema total) rectally once as needed for severe constipation.     timolol 0.5 % ophthalmic solution  Commonly known as:  TIMOPTIC  Place 1 drop into both eyes 2 (two) times daily.     traMADol  50 MG tablet  Commonly known as:  ULTRAM  Take 1-2 tablets (50-100 mg total) by mouth every 6 (six) hours as needed (mild pain).        Immunizations: Immunization History  Administered Date(s) Administered  . Influenza Split 10/30/2011  . Influenza, High Dose Seasonal PF 11/11/2012  . PPD Test 03/17/2015  . Pneumococcal Polysaccharide-23 01/29/2009  . Td 01/29/2006     Physical Exam: Filed Vitals:   03/21/15  1120  BP: 139/78  Pulse: 76  Temp: 98 F (36.7 C)  TempSrc: Oral  Resp: 20  Height: 5\' 5"  (1.651 m)  Weight: 187 lb (84.823 kg)  SpO2: 98%   Body mass index is 31.12 kg/(m^2).  General- elderly female, obese, in no acute distress Head- normocephalic, atraumatic Nose- no maxillary or frontal sinus tenderness, no nasal discharge Throat- moist mucus membrane Eyes- PERRLA, EOMI, no pallor, no icterus Neck- no cervical lymphadenopathy Cardiovascular- normal s1,s2, no murmurs, trace leg edema Respiratory- bilateral clear to auscultation, no wheeze, no rhonchi, no crackles Abdomen- bowel sounds present, soft, non tender Musculoskeletal- able to move all 4 extremities, right knee limited ROM Neurological- alert and oriented to person, place and time Skin- warm and dry, right knee surgical incision with steri strip and healing well Psychiatry- normal mood and affect    Labs reviewed: Basic Metabolic Panel:  Recent Labs  03/09/15 1500 03/15/15 0418 03/16/15 03/16/15 0407  NA 137 136 137 137  K 4.1 4.1  --  4.2  CL 102 106  --  107  CO2 28 22  --  24  GLUCOSE 90 179*  --  152*  BUN 21* 20 16 16   CREATININE 1.13* 1.00 0.7 0.71  CALCIUM 9.7 8.6*  --  8.6*   Liver Function Tests:  Recent Labs  03/09/15 1500  AST 23  ALT 22  ALKPHOS 95  BILITOT 0.4  PROT 7.1  ALBUMIN 4.0   No results for input(s): LIPASE, AMYLASE in the last 8760 hours. No results for input(s): AMMONIA in the last 8760 hours. CBC:  Recent Labs  03/15/15 0418 03/16/15 0407  03/17/15 03/17/15 0416  WBC 7.2 8.7 7.0 7.0  HGB 12.0 11.3*  --  12.3  HCT 35.4* 33.6*  --  36.6  MCV 94.9 95.7  --  97.1  PLT 171 184  --  195    Assessment/Plan  Unsteady gait Will have patient work with PT/OT as tolerated to regain strength and restore function.  Fall precautions are in place.  Right knee Osteoarthritis  S/P right total knee arthroplasty. Has f/u with orthopedics. RLE WBAT. Continue OxyIR 5 mg 1-2 tabs q3h prn pain, tramadol prn pain and tylenol 650 mg q6h prn pain. Continue robaxin 500 mg q6h prn muscle spasm. Continue xarelto for dvt prophylaxis. Will have her work with physical therapy and occupational therapy team to help with gait training and muscle strengthening exercises.fall precautions. Skin care. Encourage to be out of bed.   Leg edema Minimal, continue lasix reduced dosing of 20 mg daily and monitor bmp. To wear ted hose  HTN Stable, continue coreg and lisinopril for now, monitor BP  Chronic Depression  Stable, continue Wellbutrin for now  Constipation Stable on colace with prn miralax and prn dulcolax suppository  Glaucoma continue Restasis eyedrops and Timoptic drops   Goals of care: short term rehabilitation   Labs/tests ordered: bmp  Family/ staff Communication: reviewed care plan with patient and nursing supervisor    Blanchie Serve, MD Internal Medicine [place], [place] [address] Cell Phone (Monday-Friday 8 am - 5 pm): [phone] On Call: [phone] and follow prompts after 5 pm and on weekends Office Phone: [phone] Office Fax: [phone]

## 2015-03-22 ENCOUNTER — Encounter: Payer: Self-pay | Admitting: Adult Health

## 2015-03-22 ENCOUNTER — Non-Acute Institutional Stay (SKILLED_NURSING_FACILITY): Payer: Medicare Other | Admitting: Adult Health

## 2015-03-22 DIAGNOSIS — H409 Unspecified glaucoma: Secondary | ICD-10-CM

## 2015-03-22 DIAGNOSIS — F329 Major depressive disorder, single episode, unspecified: Secondary | ICD-10-CM | POA: Diagnosis not present

## 2015-03-22 DIAGNOSIS — K5901 Slow transit constipation: Secondary | ICD-10-CM

## 2015-03-22 DIAGNOSIS — E785 Hyperlipidemia, unspecified: Secondary | ICD-10-CM

## 2015-03-22 DIAGNOSIS — M1711 Unilateral primary osteoarthritis, right knee: Secondary | ICD-10-CM

## 2015-03-22 DIAGNOSIS — F32A Depression, unspecified: Secondary | ICD-10-CM

## 2015-03-22 DIAGNOSIS — I10 Essential (primary) hypertension: Secondary | ICD-10-CM

## 2015-03-22 NOTE — Progress Notes (Signed)
Patient ID: [name], female   DOB: November 04, 1935, 80 y.o.   MRN: [id number]    DATE:  03/22/15  MRN:  [id number]  BIRTHDAY: [birth year]  Facility:  Nursing Home Location:  [place] Room Number: 1205-P  LEVEL OF CARE:  SNF [id number])  Contact Information    Name Relation Home Work [place] Son [phone]     Maddox,Alice Relative [id number]     Dionne Milo [phone]         Code Status History    Date Active Date Inactive Code Status Order ID Comments User Context   06/01/2011  2:00 PM 06/05/2011  1:58 PM Full Code [id number]  Yvonna Alanis, RN Inpatient       Chief Complaint  Patient presents with  . Discharge Note    HISTORY OF PRESENT ILLNESS:  This is a 80 year old female who is for discharge home with outpatient clinic. She has been admitted to [facility] on 03/17/15 from [facility] She has PMH of hypertension, osteoporosis, headache, depression and OAB. She has osteoarthritis of right knee for which she had right total knee arthroplasty on 03/14/15.   Patient was admitted to this facility for short-term rehabilitation after the patient's recent hospitalization.  Patient has completed SNF rehabilitation and therapy has cleared the patient for discharge.  PAST MEDICAL HISTORY:  Past Medical History  Diagnosis Date  . Hypertension   . Arthritis   . Osteoporosis   . Headache(784.0)   . Depression   . Glaucoma   . Degenerative arthritis of hip     s/p THR 05/2011  . OAB (overactive bladder)   . Unsteady gait   . Primary osteoarthritis of right knee   . Bilateral edema of lower extremity   . Constipation   . Slow transit constipation   . HLD (hyperlipidemia)      CURRENT MEDICATIONS: Reviewed  Patient's Medications  New Prescriptions   No medications on file  Previous Medications   ACETAMINOPHEN (TYLENOL) 325 MG TABLET    Take 2 tablets (650 mg total) by mouth every 6 (six) hours as needed for mild  pain (or Fever >/= 101).   ASPIRIN EC 81 MG TABLET    Take 81 mg by mouth daily. Give for 3 more weeks, stop 04/26/15   BISACODYL (DULCOLAX) 10 MG SUPPOSITORY    Place 1 suppository (10 mg total) rectally daily as needed for moderate constipation.   CARVEDILOL (COREG) 12.5 MG TABLET    take 1 tablet by mouth twice a day   DIPHENHYDRAMINE (BENADRYL) 12.5 MG/5ML ELIXIR    Take 5-10 mLs (12.5-25 mg total) by mouth every 4 (four) hours as needed for itching.   DOCUSATE SODIUM (COLACE) 100 MG CAPSULE    Take 100 mg by mouth 2 (two) times daily.   FUROSEMIDE (LASIX) 20 MG TABLET    Take 20 mg by mouth daily.   LISINOPRIL (PRINIVIL,ZESTRIL) 30 MG TABLET    take 1 tablet by mouth once daily   METHOCARBAMOL (ROBAXIN) 500 MG TABLET    Take 1 tablet (500 mg total) by mouth every 6 (six) hours as needed for muscle spasms.   METOCLOPRAMIDE (REGLAN) 5 MG TABLET    Take 1 tablet (5 mg total) by mouth every 8 (eight) hours as needed for nausea (if ondansetron (ZOFRAN) ineffective.).   ONDANSETRON (ZOFRAN) 4 MG TABLET    Take 1 tablet (4 mg total) by mouth every 6 (six) hours as  needed for nausea.   OXYCODONE (OXY IR/ROXICODONE) 5 MG IMMEDIATE RELEASE TABLET    Take 1-2 tablets (5-10 mg total) by mouth every 3 (three) hours as needed for moderate pain or severe pain.   POLYETHYLENE GLYCOL (MIRALAX / GLYCOLAX) PACKET    Take 17 g by mouth daily as needed for mild constipation.   RESTASIS 0.05 % OPHTHALMIC EMULSION    Place 1 drop into both eyes daily.   RIVAROXABAN (XARELTO) 10 MG TABS TABLET    Take 1 tablet (10 mg total) by mouth daily with breakfast. Take Xarelto for two and a half more weeks, then discontinue Xarelto. Once the patient has completed the blood thinner regimen, then take a Baby 81 mg Aspirin daily for three more weeks.   SODIUM PHOSPHATE (FLEET) 7-19 GM/118ML ENEM    Place 133 mLs (1 enema total) rectally once as needed for severe constipation.   TIMOLOL (TIMOPTIC) 0.5 % OPHTHALMIC SOLUTION    Place  1 drop into both eyes 2 (two) times daily.   TRAMADOL (ULTRAM) 50 MG TABLET    Take 1-2 tablets (50-100 mg total) by mouth every 6 (six) hours as needed (mild pain).  Modified Medications   No medications on file  Discontinued Medications   COLESTIPOL (COLESTID) 1 G TABLET    Take 2 tablets (2 g total) by mouth 2 (two) times daily.   DOCUSATE SODIUM (COLACE) 100 MG CAPSULE    Take 1 capsule (100 mg total) by mouth 2 (two) times daily.   FUROSEMIDE (LASIX) 40 MG TABLET    take 1 tablet by mouth once daily     No Known Allergies   REVIEW OF SYSTEMS:  GENERAL: no change in appetite, no fatigue, no weight changes, no fever, chills or weakness EYES: Denies change in vision, dry eyes, eye pain, itching or discharge EARS: Denies change in hearing, ringing in ears, or earache NOSE: Denies nasal congestion or epistaxis MOUTH and THROAT: Denies oral discomfort, gingival pain or bleeding, pain from teeth or hoarseness   RESPIRATORY: no cough, SOB, DOE, wheezing, hemoptysis CARDIAC: no chest pain, edema or palpitations GI: no abdominal pain, diarrhea, constipation, heart burn, nausea or vomiting GU: Denies dysuria, frequency, hematuria, incontinence, or discharge PSYCHIATRIC: Denies feeling of depression or anxiety. No report of hallucinations, insomnia, paranoia, or agitation   PHYSICAL EXAMINATION  GENERAL APPEARANCE: Well nourished. In no acute distress. Normal body habitus SKIN:  Right knee surgical incision is covered with dry dressing, no erythema  HEAD: Normal in size and contour. No evidence of trauma EYES: Lids open and close normally. No blepharitis, entropion or ectropion. PERRL. Conjunctivae are clear and sclerae are white. Lenses are without opacity EARS: Pinnae are normal. Patient hears normal voice tunes of the examiner MOUTH and THROAT: Lips are without lesions. Oral mucosa is moist and without lesions. Tongue is normal in shape, size, and color and without lesions NECK:  supple, trachea midline, no neck masses, no thyroid tenderness, no thyromegaly LYMPHATICS: no LAN in the neck, no supraclavicular LAN RESPIRATORY: breathing is even & unlabored, BS CTAB CARDIAC: RRR, no murmur,no extra heart sounds, no edema GI: abdomen soft, normal BS, no masses, no tenderness, no hepatomegaly, no splenomegaly EXTREMITIES:  Able to move 4 extremities PSYCHIATRIC: Alert and oriented X 3. Affect and behavior are appropriate  LABS/RADIOLOGY: Labs reviewed: Basic Metabolic Panel:  Recent Labs  03/09/15 1500 03/15/15 0418 03/16/15 03/16/15 0407  NA 137 136 137 137  K 4.1 4.1  --  4.2  CL 102 106  --  107  CO2 28 22  --  24  GLUCOSE 90 179*  --  152*  BUN 21* 20 16 16   CREATININE 1.13* 1.00 0.7 0.71  CALCIUM 9.7 8.6*  --  8.6*   Liver Function Tests:  Recent Labs  03/09/15 1500  AST 23  ALT 22  ALKPHOS 95  BILITOT 0.4  PROT 7.1  ALBUMIN 4.0   CBC:  Recent Labs  03/15/15 0418 03/16/15 0407 03/17/15 03/17/15 0416  WBC 7.2 8.7 7.0 7.0  HGB 12.0 11.3*  --  12.3  HCT 35.4* 33.6*  --  36.6  MCV 94.9 95.7  --  97.1  PLT 171 184  --  195    ASSESSMENT/PLAN:  Osteoarthritis S/P right total knee arthroplasty - for outpatient clinic; RLE WBAT; continue OxyIR 5 mg 1-2 tabs every 3 hours when necessary, Tylenol 325 mg 2 tabs = 650 mg by mouth every 6 hours when necessary and tramadol 50 mg 1-2 tabs by mouth every 6 hours when necessary for pain; Robaxin 500 mg 1 tab by mouth every 6 hours when necessary for muscle spasm; Xarelto 10 mg 1 tab by mouth daily until 04/04/15 then start aspirin 81 mg 1 tab by mouth daily 3 weeks for DVT prophylaxis; follow-up with Dr. Wynelle Link, orthopedic surgeon, on 03/24/15  Hyperlipidemia - she has requested colestipol to be discontinued  Hypertension - continue Coreg 12.5 mg 1 tab by mouth twice a day, Lasix 40 mg 1 tab by mouth daily and lisinopril 30 mg 1 tab by mouth daily  Depression - mood is stable; continue Wellbutrin SR  200 mg 1 tab by mouth twice a day  BLE edema - no edema noted; continue Lasix 20 mg 1 tab daily  Constipation - continue Dulcolax suppository 10 mg 1 per rectum daily when necessary, Colace 100 mg 1 capsule by mouth twice a day and MiraLAX 17 g by mouth daily when necessary  Glaucoma - no complaints of eye pain; continue Restasis eyedrops and Timoptic drops      I have filled out patient's discharge paperwork and written prescriptions.  Patient will have outpatient therapy  Total discharge time: Less than 30 minutes  Discharge time involved coordination of the discharge process with social worker, nursing staff and therapy department.     [facility], NP [employer] [phone]

## 2015-03-22 NOTE — Progress Notes (Signed)
Patient ID: [name], female   DOB: Jun 30, 1935, 80 y.o.   MRN: [id number]

## 2015-03-22 NOTE — Progress Notes (Signed)
Patient ID: [name], female   DOB: 1935-10-11, 80 y.o.   MRN: [id number]    DATE:  03/18/15  MRN:  [id number]  BIRTHDAY: Oct 25, 1935  Facility:  Nursing Home Location:  [place] Room Number: 1205-P  LEVEL OF CARE:  SNF [id number])  Contact Information    Name Relation Home Work [place] Son [phone]     Maddox,Alice Relative [phone]     Dionne Milo [phone]         Code Status History    Date Active Date Inactive Code Status Order ID Comments User Context   06/01/2011  2:00 PM 06/05/2011  1:58 PM Full Code [id number]  Yvonna Alanis, RN Inpatient       Chief Complaint  Patient presents with  . Hospitalization Follow-up    HISTORY OF PRESENT ILLNESS:  This is a 80 year old female who has been admitted to [facility] on 03/17/15 from [facility]. She has PMH of hypertension, osteoporosis, headache, depression and OAB. She has osteoarthritis of right knee for which she had right total knee arthroplasty on 03/14/15. She has been admitted for a short-term rehabilitation.  PAST MEDICAL HISTORY:  Past Medical History  Diagnosis Date  . Hypertension   . Arthritis   . Osteoporosis   . Headache(784.0)   . Depression   . Glaucoma   . Degenerative arthritis of hip     s/p THR 05/2011  . OAB (overactive bladder)   . Unsteady gait   . Primary osteoarthritis of right knee   . Bilateral edema of lower extremity   . Constipation   . Slow transit constipation   . HLD (hyperlipidemia)      CURRENT MEDICATIONS: Reviewed  Patient's Medications  New Prescriptions   No medications on file  Previous Medications   ACETAMINOPHEN (TYLENOL) 325 MG TABLET    Take 2 tablets (650 mg total) by mouth every 6 (six) hours as needed for mild pain (or Fever >/= 101).   ASPIRIN EC 81 MG TABLET    Take 81 mg by mouth daily. Give for 3 more weeks, stop 04/26/15   BISACODYL (DULCOLAX) 10 MG SUPPOSITORY    Place 1 suppository (10 mg  total) rectally daily as needed for moderate constipation.   CARVEDILOL (COREG) 12.5 MG TABLET    take 1 tablet by mouth twice a day   COLESTIPOL (COLESTID) 1 G TABLET    Take 2 tablets (2 g total) by mouth 2 (two) times daily.   DIPHENHYDRAMINE (BENADRYL) 12.5 MG/5ML ELIXIR    Take 5-10 mLs (12.5-25 mg total) by mouth every 4 (four) hours as needed for itching.   DOCUSATE SODIUM (COLACE) 100 MG CAPSULE    Take 1 capsule (100 mg total) by mouth 2 (two) times daily.   FUROSEMIDE (LASIX) 40 MG TABLET    take 1 tablet by mouth once daily   LISINOPRIL (PRINIVIL,ZESTRIL) 30 MG TABLET    take 1 tablet by mouth once daily   METHOCARBAMOL (ROBAXIN) 500 MG TABLET    Take 1 tablet (500 mg total) by mouth every 6 (six) hours as needed for muscle spasms.   METOCLOPRAMIDE (REGLAN) 5 MG TABLET    Take 1 tablet (5 mg total) by mouth every 8 (eight) hours as needed for nausea (if ondansetron (ZOFRAN) ineffective.).   ONDANSETRON (ZOFRAN) 4 MG TABLET    Take 1 tablet (4 mg total) by mouth every 6 (six) hours as needed for nausea.  OXYCODONE (OXY IR/ROXICODONE) 5 MG IMMEDIATE RELEASE TABLET    Take 1-2 tablets (5-10 mg total) by mouth every 3 (three) hours as needed for moderate pain or severe pain.   POLYETHYLENE GLYCOL (MIRALAX / GLYCOLAX) PACKET    Take 17 g by mouth daily as needed for mild constipation.   RESTASIS 0.05 % OPHTHALMIC EMULSION    Place 1 drop into both eyes daily.   RIVAROXABAN (XARELTO) 10 MG TABS TABLET    Take 1 tablet (10 mg total) by mouth daily with breakfast. Take Xarelto for two and a half more weeks, then discontinue Xarelto. Once the patient has completed the blood thinner regimen, then take a Baby 81 mg Aspirin daily for three more weeks.   SODIUM PHOSPHATE (FLEET) 7-19 GM/118ML ENEM    Place 133 mLs (1 enema total) rectally once as needed for severe constipation.   TIMOLOL (TIMOPTIC) 0.5 % OPHTHALMIC SOLUTION    Place 1 drop into both eyes 2 (two) times daily.   TRAMADOL (ULTRAM) 50 MG  TABLET    Take 1-2 tablets (50-100 mg total) by mouth every 6 (six) hours as needed (mild pain).  Modified Medications   No medications on file  Discontinued Medications   BUPROPION (WELLBUTRIN SR) 150 MG 12 HR TABLET    take 1 tablet by mouth twice a day   BUPROPION (WELLBUTRIN SR) 200 MG 12 HR TABLET    Take 200 mg by mouth 2 (two) times daily.     No Known Allergies   REVIEW OF SYSTEMS:  GENERAL: no change in appetite, no fatigue, no weight changes, no fever, chills or weakness EYES: Denies change in vision, dry eyes, eye pain, itching or discharge EARS: Denies change in hearing, ringing in ears, or earache NOSE: Denies nasal congestion or epistaxis MOUTH and THROAT: Denies oral discomfort, gingival pain or bleeding, pain from teeth or hoarseness   RESPIRATORY: no cough, SOB, DOE, wheezing, hemoptysis CARDIAC: no chest pain, edema or palpitations GI: no abdominal pain, diarrhea, constipation, heart burn, nausea or vomiting GU: Denies dysuria, frequency, hematuria, incontinence, or discharge PSYCHIATRIC: Denies feeling of depression or anxiety. No report of hallucinations, insomnia, paranoia, or agitation   PHYSICAL EXAMINATION  GENERAL APPEARANCE: Well nourished. In no acute distress. Normal body habitus SKIN:  Right knee surgical incision is covered with dry dressing, no erythema  HEAD: Normal in size and contour. No evidence of trauma EYES: Lids open and close normally. No blepharitis, entropion or ectropion. PERRL. Conjunctivae are clear and sclerae are white. Lenses are without opacity EARS: Pinnae are normal. Patient hears normal voice tunes of the examiner MOUTH and THROAT: Lips are without lesions. Oral mucosa is moist and without lesions. Tongue is normal in shape, size, and color and without lesions NECK: supple, trachea midline, no neck masses, no thyroid tenderness, no thyromegaly LYMPHATICS: no LAN in the neck, no supraclavicular LAN RESPIRATORY: breathing is even &  unlabored, BS CTAB CARDIAC: RRR, no murmur,no extra heart sounds, no edema GI: abdomen soft, normal BS, no masses, no tenderness, no hepatomegaly, no splenomegaly EXTREMITIES:  Able to move 4 extremities PSYCHIATRIC: Alert and oriented X 3. Affect and behavior are appropriate  LABS/RADIOLOGY: Labs reviewed: Basic Metabolic Panel:  Recent Labs  03/09/15 1500 03/15/15 0418 03/16/15 03/16/15 0407  NA 137 136 137 137  K 4.1 4.1  --  4.2  CL 102 106  --  107  CO2 28 22  --  24  GLUCOSE 90 179*  --  152*  BUN 21* 20 16 16   CREATININE 1.13* 1.00 0.7 0.71  CALCIUM 9.7 8.6*  --  8.6*   Liver Function Tests:  Recent Labs  03/09/15 1500  AST 23  ALT 22  ALKPHOS 95  BILITOT 0.4  PROT 7.1  ALBUMIN 4.0   CBC:  Recent Labs  03/15/15 0418 03/16/15 0407 03/17/15 03/17/15 0416  WBC 7.2 8.7 7.0 7.0  HGB 12.0 11.3*  --  12.3  HCT 35.4* 33.6*  --  36.6  MCV 94.9 95.7  --  97.1  PLT 171 184  --  195    ASSESSMENT/PLAN:  Osteoarthritis S/P right total knee arthroplasty - for rehabilitation; RLE WBAT; continue OxyIR 5 mg 1-2 tabs every 3 hours when necessary, Tylenol 325 mg 2 tabs = 650 mg by mouth every 6 hours when necessary and tramadol 50 mg 1-2 tabs by mouth every 6 hours when necessary for pain; Robaxin 500 mg 1 tab by mouth every 6 hours when necessary for muscle spasm; Xarelto 10 mg 1 tab by mouth daily until 04/04/15 then start aspirin 81 mg 1 tab by mouth daily 3 weeks for DVT prophylaxis; follow-up with Dr. Wynelle Link, orthopedic surgeon, on 03/24/15; check CBC  Hyperlipidemia - she has requested colestipol to be discontinued  Hypertension - continue Coreg 12.5 mg 1 tab by mouth twice a day, Lasix 40 mg 1 tab by mouth daily and lisinopril 30 mg 1 tab by mouth daily  Depression - mood is stable; continue Wellbutrin SR 200 mg 1 tab by mouth twice a day  BLE edema - no edema noted; patient complains of having to go to the bathroom to urinate several times and is requesting  for Lasix to be decreased; decrease Lasix from 40 mg to 20 mg 1 tab by mouth daily; check BMP  Constipation - continue Dulcolax suppository 10 mg 1 per rectum daily when necessary, Colace 100 mg 1 capsule by mouth twice a day and MiraLAX 17 g by mouth daily when necessary  Glaucoma - no complaints of eye pain; continue Restasis eyedrops and Timoptic drops    Goals of care:  Short-term rehabilitation    [facility], NP [facility] [phone]

## 2015-03-24 DIAGNOSIS — Z96651 Presence of right artificial knee joint: Secondary | ICD-10-CM | POA: Diagnosis not present

## 2015-03-24 DIAGNOSIS — Z471 Aftercare following joint replacement surgery: Secondary | ICD-10-CM | POA: Diagnosis not present

## 2015-03-28 DIAGNOSIS — M1711 Unilateral primary osteoarthritis, right knee: Secondary | ICD-10-CM | POA: Diagnosis not present

## 2015-03-30 DIAGNOSIS — Z471 Aftercare following joint replacement surgery: Secondary | ICD-10-CM | POA: Diagnosis not present

## 2015-03-30 DIAGNOSIS — Z96651 Presence of right artificial knee joint: Secondary | ICD-10-CM | POA: Diagnosis not present

## 2015-04-01 DIAGNOSIS — M1711 Unilateral primary osteoarthritis, right knee: Secondary | ICD-10-CM | POA: Diagnosis not present

## 2015-04-04 DIAGNOSIS — M1711 Unilateral primary osteoarthritis, right knee: Secondary | ICD-10-CM | POA: Diagnosis not present

## 2015-04-08 DIAGNOSIS — Z471 Aftercare following joint replacement surgery: Secondary | ICD-10-CM | POA: Diagnosis not present

## 2015-04-08 DIAGNOSIS — Z96651 Presence of right artificial knee joint: Secondary | ICD-10-CM | POA: Diagnosis not present

## 2015-04-14 ENCOUNTER — Other Ambulatory Visit: Payer: Self-pay

## 2015-04-14 DIAGNOSIS — Z471 Aftercare following joint replacement surgery: Secondary | ICD-10-CM | POA: Diagnosis not present

## 2015-04-14 DIAGNOSIS — Z96651 Presence of right artificial knee joint: Secondary | ICD-10-CM | POA: Diagnosis not present

## 2015-04-14 MED ORDER — FUROSEMIDE 20 MG PO TABS: 20.0000 mg | ORAL_TABLET | Freq: Every day | ORAL | Status: DC

## 2015-04-14 NOTE — Telephone Encounter (Signed)
Refill request for furosemide 20 mg tablets.  Prescription sent to [facility] # [phone]  Phone # [phone] Fax: [phone]

## 2015-04-14 NOTE — Telephone Encounter (Signed)
This encounter was created in error - please disregard.

## 2015-05-12 DIAGNOSIS — Z96651 Presence of right artificial knee joint: Secondary | ICD-10-CM | POA: Diagnosis not present

## 2015-05-12 DIAGNOSIS — Z471 Aftercare following joint replacement surgery: Secondary | ICD-10-CM | POA: Diagnosis not present

## 2015-05-18 ENCOUNTER — Ambulatory Visit
Admission: RE | Admit: 2015-05-18 | Discharge: 2015-05-18 | Disposition: A | Discharge status: Home / Self Care | Payer: Medicare Other | Source: Ambulatory Visit

## 2015-05-18 DIAGNOSIS — Z1231 Encounter for screening mammogram for malignant neoplasm of breast: Secondary | ICD-10-CM | POA: Diagnosis not present

## 2015-05-27 ENCOUNTER — Ambulatory Visit: Payer: Medicare Other

## 2015-06-16 DIAGNOSIS — Z96651 Presence of right artificial knee joint: Secondary | ICD-10-CM | POA: Diagnosis not present

## 2015-06-16 DIAGNOSIS — Z471 Aftercare following joint replacement surgery: Secondary | ICD-10-CM | POA: Diagnosis not present

## 2015-06-21 DIAGNOSIS — I119 Hypertensive heart disease without heart failure: Secondary | ICD-10-CM | POA: Diagnosis not present

## 2015-07-25 DIAGNOSIS — H25811 Combined forms of age-related cataract, right eye: Secondary | ICD-10-CM | POA: Diagnosis not present

## 2015-07-25 DIAGNOSIS — H401133 Primary open-angle glaucoma, bilateral, severe stage: Secondary | ICD-10-CM | POA: Diagnosis not present

## 2015-07-25 DIAGNOSIS — H04123 Dry eye syndrome of bilateral lacrimal glands: Secondary | ICD-10-CM | POA: Diagnosis not present

## 2015-07-25 DIAGNOSIS — Z0100 Encounter for examination of eyes and vision without abnormal findings: Secondary | ICD-10-CM | POA: Diagnosis not present

## 2015-09-19 DIAGNOSIS — H401123 Primary open-angle glaucoma, left eye, severe stage: Secondary | ICD-10-CM | POA: Diagnosis not present

## 2015-09-19 DIAGNOSIS — H401112 Primary open-angle glaucoma, right eye, moderate stage: Secondary | ICD-10-CM | POA: Diagnosis not present

## 2015-10-25 DIAGNOSIS — I119 Hypertensive heart disease without heart failure: Secondary | ICD-10-CM | POA: Diagnosis not present

## 2015-10-25 DIAGNOSIS — R609 Edema, unspecified: Secondary | ICD-10-CM | POA: Diagnosis not present

## 2015-10-25 DIAGNOSIS — Z23 Encounter for immunization: Secondary | ICD-10-CM | POA: Diagnosis not present

## 2015-11-09 DIAGNOSIS — H401123 Primary open-angle glaucoma, left eye, severe stage: Secondary | ICD-10-CM | POA: Diagnosis not present

## 2015-11-09 DIAGNOSIS — H401112 Primary open-angle glaucoma, right eye, moderate stage: Secondary | ICD-10-CM | POA: Diagnosis not present

## 2015-12-19 ENCOUNTER — Other Ambulatory Visit: Payer: Self-pay | Admitting: Adult Health

## 2015-12-21 DIAGNOSIS — H401123 Primary open-angle glaucoma, left eye, severe stage: Secondary | ICD-10-CM | POA: Diagnosis not present

## 2015-12-21 DIAGNOSIS — H401112 Primary open-angle glaucoma, right eye, moderate stage: Secondary | ICD-10-CM | POA: Diagnosis not present

## 2016-01-10 DIAGNOSIS — E039 Hypothyroidism, unspecified: Secondary | ICD-10-CM | POA: Diagnosis not present

## 2016-01-10 DIAGNOSIS — D519 Vitamin B12 deficiency anemia, unspecified: Secondary | ICD-10-CM | POA: Diagnosis not present

## 2016-02-02 DIAGNOSIS — T1502XA Foreign body in cornea, left eye, initial encounter: Secondary | ICD-10-CM | POA: Diagnosis not present

## 2016-02-02 DIAGNOSIS — H52203 Unspecified astigmatism, bilateral: Secondary | ICD-10-CM | POA: Diagnosis not present

## 2016-02-02 DIAGNOSIS — H02055 Trichiasis without entropian left lower eyelid: Secondary | ICD-10-CM | POA: Diagnosis not present

## 2016-02-02 DIAGNOSIS — H401133 Primary open-angle glaucoma, bilateral, severe stage: Secondary | ICD-10-CM | POA: Diagnosis not present

## 2016-02-14 DIAGNOSIS — H25811 Combined forms of age-related cataract, right eye: Secondary | ICD-10-CM | POA: Diagnosis not present

## 2016-02-14 DIAGNOSIS — H52201 Unspecified astigmatism, right eye: Secondary | ICD-10-CM | POA: Diagnosis not present

## 2016-03-01 ENCOUNTER — Encounter: Payer: Self-pay | Admitting: Podiatry

## 2016-03-01 ENCOUNTER — Ambulatory Visit (INDEPENDENT_AMBULATORY_CARE_PROVIDER_SITE_OTHER): Payer: Medicare Other | Admitting: Podiatry

## 2016-03-01 VITALS — BP 112/68 | HR 65 | Resp 16 | Ht 63.0 in | Wt 170.0 lb

## 2016-03-01 DIAGNOSIS — L600 Ingrowing nail: Secondary | ICD-10-CM | POA: Diagnosis not present

## 2016-03-01 NOTE — Patient Instructions (Signed)

## 2016-03-01 NOTE — Progress Notes (Signed)
   Subjective:    Patient ID: [name], female    DOB: 1935-12-15, 81 y.o.   MRN: [id number]  HPI  Chief Complaint  Patient presents with  . Nail Problem    BL; Great toes are painful on lateral side x "for a long time"   . Toe Pain    BL; 2nd toes. Pt states that the "toes are too long and it makes painful to wear shoes, unsure if it is due to the nail or toe" wants you to look at toes before doing an xray       Review of Systems     Objective:   Physical Exam        Assessment & Plan:

## 2016-03-02 NOTE — Progress Notes (Signed)
Subjective:     Patient ID: [name], female   DOB: 1935-08-10, 81 y.o.   MRN: [id number]  HPI patient presents with ingrown toenail deformity bilateral hallux stating she's tried to soak and trimming and cannot get the corners out and it's become quite painful for   Review of Systems  All other systems reviewed and are negative.      Objective:   Physical Exam  Constitutional: She is oriented to person, place, and time.  Cardiovascular: Intact distal pulses.   Musculoskeletal: Normal range of motion.  Neurological: She is oriented to person, place, and time.  Skin: Skin is warm.  Nursing note and vitals reviewed.  neurovascular status intact muscle strength adequate range of motion within normal limits with patient found to have incurvated hallux nail borders lateral border bilateral that are painful when pressed and making shoe gear difficult. There is slight distal redness but no drainage noted or no proximal edema erythema drainage noted and patient is noted to have good digital perfusion and is well oriented 3     Assessment:     Ingrown toenail deformities hallux bilateral lateral border with pain    Plan:     H&P conditions reviewed and recommended removal of the corners. Explained procedure and risk and patient wants surgery and today I infiltrated 60 mg Xylocaine Marcaine mixture remove the lateral borders exposed matrix and applied phenol 3 applications 30 seconds followed by alcohol lavage and sterile dressing. Gave instructions on soaks and reappoint

## 2016-03-21 DIAGNOSIS — H401133 Primary open-angle glaucoma, bilateral, severe stage: Secondary | ICD-10-CM | POA: Diagnosis not present

## 2016-04-30 DIAGNOSIS — H401112 Primary open-angle glaucoma, right eye, moderate stage: Secondary | ICD-10-CM | POA: Diagnosis not present

## 2016-04-30 DIAGNOSIS — H401123 Primary open-angle glaucoma, left eye, severe stage: Secondary | ICD-10-CM | POA: Diagnosis not present

## 2016-08-20 ENCOUNTER — Ambulatory Visit (INDEPENDENT_AMBULATORY_CARE_PROVIDER_SITE_OTHER): Payer: Medicare Other

## 2016-08-20 ENCOUNTER — Ambulatory Visit (INDEPENDENT_AMBULATORY_CARE_PROVIDER_SITE_OTHER): Payer: Medicare Other | Admitting: Orthopaedic Surgery

## 2016-08-20 DIAGNOSIS — M25552 Pain in left hip: Secondary | ICD-10-CM

## 2016-08-20 DIAGNOSIS — M4722 Other spondylosis with radiculopathy, cervical region: Secondary | ICD-10-CM | POA: Diagnosis not present

## 2016-08-20 DIAGNOSIS — Z96642 Presence of left artificial hip joint: Secondary | ICD-10-CM | POA: Diagnosis not present

## 2016-08-20 DIAGNOSIS — M542 Cervicalgia: Secondary | ICD-10-CM

## 2016-08-20 MED ORDER — GABAPENTIN 300 MG PO CAPS: 300.0000 mg | ORAL_CAPSULE | Freq: Every day | ORAL | 3 refills | Status: DC

## 2016-08-20 MED ORDER — METHYLPREDNISOLONE 4 MG PO TABS
ORAL_TABLET | ORAL | 0 refills | Status: DC
Start: 2016-08-20 — End: 2016-08-30

## 2016-08-20 NOTE — Progress Notes (Signed)
Office Visit Note   Patient: [name]           Date of Birth: 1935/03/06           MRN: [id number] Visit Date: 08/20/2016              Requested by: Levin Erp, MD [place], [place] [address] PCP: Levin Erp, MD   Assessment & Plan: Visit Diagnoses:  1. Pain in left hip   2. Neck pain   3. Osteoarthritis of spine with radiculopathy, cervical region   4. History of left hip replacement     Plan: She states this numbness and tingling in her left arm is only been short-lived as well as her left hip pain even though she said is been hurting since she fell a year ago. She would rather just try a steroid taper and some Neurontin and just activity modification. We will try these things now see her back in a month to see if this is helped. All questions were encouraged and answered.  Follow-Up Instructions: Return in about 4 weeks (around 09/17/2016).   Orders:  Orders Placed This Encounter  Procedures  . XR HIP UNILAT W OR W/O PELVIS 1V LEFT  . XR Cervical Spine 2 or 3 views   Meds ordered this encounter  Medications  . methylPREDNISolone (MEDROL) 4 MG tablet    Sig: Medrol dose pack. Take as instructed    Dispense:  21 tablet    Refill:  0  . gabapentin (NEURONTIN) 300 MG capsule    Sig: Take 1 capsule (300 mg total) by mouth at bedtime.    Dispense:  30 capsule    Refill:  3      Procedures: No procedures performed   Clinical Data: No additional findings.   Subjective: No chief complaint on file. The patient is someone I performed a left total hip arthroplasty many years ago. She said that left hip is been hurting her for about a year She fell roll hard in her bathroom. She is also been complaining of numbness and tingling going down her left arm with neck pain. This is been slowly getting worse for a few months now and only hurts when she is holding books or anything and she has numbness and tingling again down her arm. She said the  pain in her hip just comes and goes and mostly is when she is first getting up from bending over position. She denies any instability of the left hip or popping.  HPI  Review of Systems She denies any headache, chest pain, short breath, fever, chills, nausea, vomiting  Objective: Vital Signs: There were no vitals taken for this visit.  Physical Exam She is alert and oriented 3 and in no acute distress. She gets up on the exam table easily and walks around the office easily. Ortho Exam She does have a positive Spurling sign to the left side and pain throughout the arc of motion of her neck with flexion extension lateral rotation and bending. She has some weakness in that left arm as well but is minimal. Her left hip moves fluidly without any pain with extremes of internal/external rotation. Specialty Comments:  No specialty comments available.  Imaging: Xr Hip Unilat W Or W/o Pelvis 1v Left  Result Date: 08/20/2016 An AP pelvis and lateral left hip show well-seated implant with no complicating features. There is slight vertical alignment of the stopper component.  Xr Cervical Spine 2  Or 3 Views  Result Date: 08/20/2016 2 views of the cervical spine show profound and severe arthritis at multiple levels. There is complete loss of her cervical lordosis and a significant kyphosis. 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    PMFS History: Patient Active Problem List   Diagnosis Date Noted  . Neck pain 08/20/2016  . Pain in left hip 08/20/2016  . History of left hip replacement 08/20/2016  . OA (osteoarthritis) of knee 03/14/2015  . Diastolic dysfunction 44/31/5400  . LBBB (left bundle branch block) 07/19/2013  . Obesity (BMI 30-39.9) 11/11/2012  . Chronic diarrhea   . Hypertension   . OAB (overactive bladder)   . Depression   . Osteopenia   . Symptoms referable to back 01/07/2012  . Cervical spondylosis 01/07/2012  .  Degenerative arthritis of hip 06/01/2011  . Cataract 11/17/2010  . Glaucoma 11/17/2010   Past Medical History:  Diagnosis Date  . Arthritis   . Bilateral edema of lower extremity   . Constipation   . Degenerative arthritis of hip    s/p THR 05/2011  . Depression   . Glaucoma   . Headache(784.0)   . HLD (hyperlipidemia)   . Hypertension   . OAB (overactive bladder)   . Osteoporosis   . Primary osteoarthritis of right knee   . Slow transit constipation   . Unsteady gait     Family History  Problem Relation Age of Onset  . Ovarian cancer Mother   . Alcohol abuse Father   . Stroke Brother     Past Surgical History:  Procedure Laterality Date  .  cataracts removed  2013 L  . ABDOMINAL HYSTERECTOMY  2003  . BREAST BIOPSY  1960  . BREAST CYSTS     X2 BENIGN  . BREAST SURGERY    . GLAUCOMA VALVE INSERTION Bilateral 2013, 2014   [facility] ([facility])  . TOTAL HIP ARTHROPLASTY  06/01/2011   Procedure: TOTAL HIP ARTHROPLASTY ANTERIOR APPROACH;  Surgeon: Mcarthur Rossetti, MD;  Location: [facility];  Service: Orthopedics;  Laterality: Left;  Left Total Hip Replacement, Direct Anterior Approach  . TOTAL KNEE ARTHROPLASTY Right 03/14/2015   Procedure: TOTAL KNEE ARTHROPLASTY;  Surgeon: Gaynelle Arabian, MD;  Location: [facility];  Service: Orthopedics;  Laterality: Right;   Social History   Occupational History  . Not on file.   Social History Main Topics  . Smoking status: Former Smoker    Quit date: 05/27/1980  . Smokeless tobacco: Never Used  . Alcohol use No  . Drug use: No  . Sexual activity: Not on file

## 2016-08-22 ENCOUNTER — Telehealth (INDEPENDENT_AMBULATORY_CARE_PROVIDER_SITE_OTHER): Payer: Self-pay | Admitting: Orthopaedic Surgery

## 2016-08-22 NOTE — Telephone Encounter (Signed)
Pt called states yesterday [name] prescribed Medrol dose pack. Pt filled script. Pt states upon researching med she found that people with glaucoma should use caution due to possible interactions. Please advise. Also pt does want doctor to know her arthritis has improved today.

## 2016-08-22 NOTE — Telephone Encounter (Signed)
If she is feeling better, then she can just not take the steroid

## 2016-08-22 NOTE — Telephone Encounter (Signed)
See below

## 2016-08-23 NOTE — Telephone Encounter (Signed)
Patient aware to maybe call eye doctor just to make sure

## 2016-08-30 ENCOUNTER — Other Ambulatory Visit (INDEPENDENT_AMBULATORY_CARE_PROVIDER_SITE_OTHER): Payer: Self-pay | Admitting: Family

## 2016-08-30 MED ORDER — METHYLPREDNISOLONE 4 MG PO TABS: ORAL_TABLET | ORAL | 0 refills | Status: DC

## 2016-09-06 DIAGNOSIS — H547 Unspecified visual loss: Secondary | ICD-10-CM | POA: Diagnosis not present

## 2016-09-06 DIAGNOSIS — H538 Other visual disturbances: Secondary | ICD-10-CM | POA: Diagnosis not present

## 2016-11-02 ENCOUNTER — Other Ambulatory Visit: Payer: Self-pay

## 2016-11-02 ENCOUNTER — Emergency Department (HOSPITAL_COMMUNITY): Payer: Medicare Other

## 2016-11-02 ENCOUNTER — Encounter (HOSPITAL_COMMUNITY): Payer: Self-pay | Admitting: Emergency Medicine

## 2016-11-02 ENCOUNTER — Emergency Department (HOSPITAL_COMMUNITY)
Admission: EM | Admit: 2016-11-02 | Discharge: 2016-11-02 | Disposition: A | Discharge status: Home / Self Care | Payer: Medicare Other | Attending: Emergency Medicine | Admitting: Emergency Medicine

## 2016-11-02 DIAGNOSIS — S62102A Fracture of unspecified carpal bone, left wrist, initial encounter for closed fracture: Secondary | ICD-10-CM

## 2016-11-02 DIAGNOSIS — Z87891 Personal history of nicotine dependence: Secondary | ICD-10-CM | POA: Diagnosis not present

## 2016-11-02 DIAGNOSIS — Z96653 Presence of artificial knee joint, bilateral: Secondary | ICD-10-CM | POA: Diagnosis not present

## 2016-11-02 DIAGNOSIS — S6982XA Other specified injuries of left wrist, hand and finger(s), initial encounter: Secondary | ICD-10-CM | POA: Diagnosis present

## 2016-11-02 DIAGNOSIS — W010XXA Fall on same level from slipping, tripping and stumbling without subsequent striking against object, initial encounter: Secondary | ICD-10-CM | POA: Diagnosis not present

## 2016-11-02 DIAGNOSIS — S52502A Unspecified fracture of the lower end of left radius, initial encounter for closed fracture: Secondary | ICD-10-CM | POA: Diagnosis not present

## 2016-11-02 DIAGNOSIS — Y999 Unspecified external cause status: Secondary | ICD-10-CM | POA: Diagnosis not present

## 2016-11-02 DIAGNOSIS — R072 Precordial pain: Secondary | ICD-10-CM | POA: Diagnosis not present

## 2016-11-02 DIAGNOSIS — Y939 Activity, unspecified: Secondary | ICD-10-CM | POA: Diagnosis not present

## 2016-11-02 DIAGNOSIS — S299XXA Unspecified injury of thorax, initial encounter: Secondary | ICD-10-CM | POA: Diagnosis not present

## 2016-11-02 DIAGNOSIS — Z79899 Other long term (current) drug therapy: Secondary | ICD-10-CM | POA: Diagnosis not present

## 2016-11-02 DIAGNOSIS — I10 Essential (primary) hypertension: Secondary | ICD-10-CM | POA: Insufficient documentation

## 2016-11-02 DIAGNOSIS — Y929 Unspecified place or not applicable: Secondary | ICD-10-CM | POA: Diagnosis not present

## 2016-11-02 DIAGNOSIS — S279XXA Injury of unspecified intrathoracic organ, initial encounter: Secondary | ICD-10-CM | POA: Diagnosis not present

## 2016-11-02 DIAGNOSIS — R0789 Other chest pain: Secondary | ICD-10-CM | POA: Diagnosis not present

## 2016-11-02 DIAGNOSIS — S52572A Other intraarticular fracture of lower end of left radius, initial encounter for closed fracture: Secondary | ICD-10-CM | POA: Diagnosis not present

## 2016-11-02 LAB — BASIC METABOLIC PANEL
Anion gap: 13 (ref 5–15)
BUN: 27 mg/dL — ABNORMAL HIGH (ref 6–20)
CO2: 24 mmol/L (ref 22–32)
Calcium: 9.9 mg/dL (ref 8.9–10.3)
Chloride: 100 mmol/L — ABNORMAL LOW (ref 101–111)
Creatinine, Ser: 1.02 mg/dL — ABNORMAL HIGH (ref 0.44–1.00)
GFR calc Af Amer: 59 mL/min — ABNORMAL LOW (ref >60)
GFR calc non Af Amer: 51 mL/min — ABNORMAL LOW (ref >60)
Glucose, Bld: 100 mg/dL — ABNORMAL HIGH (ref 65–99)
Potassium: 4.4 mmol/L (ref 3.5–5.1)
Sodium: 137 mmol/L (ref 135–145)

## 2016-11-02 LAB — POCT I-STAT TROPONIN I: Troponin i, poc: 0 ng/mL (ref 0.00–0.08)

## 2016-11-02 LAB — CBC
HCT: 38.2 % (ref 36.0–46.0)
Hemoglobin: 13.6 g/dL (ref 12.0–15.0)
MCH: 33.7 pg (ref 26.0–34.0)
MCHC: 35.6 g/dL (ref 30.0–36.0)
MCV: 94.6 fL (ref 78.0–100.0)
Platelets: 205 10*3/uL (ref 150–400)
RBC: 4.04 MIL/uL (ref 3.87–5.11)
RDW: 12.5 % (ref 11.5–15.5)
WBC: 9.1 10*3/uL (ref 4.0–10.5)

## 2016-11-02 MED ORDER — IBUPROFEN 200 MG PO TABS
400.0000 mg | ORAL_TABLET | Freq: Once | ORAL | Status: DC | PRN
  Filled 2016-11-02 (×2): qty 2

## 2016-11-02 MED ORDER — HYDROCODONE-ACETAMINOPHEN 5-325 MG PO TABS: 1.0000 | ORAL_TABLET | Freq: Four times a day (QID) | ORAL | 0 refills | Status: DC | PRN

## 2016-11-02 MED ORDER — HYDROCODONE-ACETAMINOPHEN 5-325 MG PO TABS
1.0000 | ORAL_TABLET | Freq: Once | ORAL | Status: AC
  Administered 2016-11-02: 1 via ORAL
  Filled 2016-11-02: qty 1

## 2016-11-02 NOTE — ED Provider Notes (Signed)
[facility] DEPT Provider Note   CSN: [id number] Arrival date & time: 11/02/16  1124     History   Chief Complaint Chief Complaint  Patient presents with  . Fall  . Wrist Pain    HPI [name] is a 81 y.o. female.  HPI Patient fell in her home at 10:30 AM today when her rubber soles on her shoes stuck to the ground causing her to fall striking her left wrist. She complains of left wrist pain and pain overlying sternum as a result of being jarred. She treated herself with Tylenol with partial relief but without adequate pain relief. She denies shortness of breath. No loss consciousness and did not hit her head. No other injury. Pain is worse with changing positions improved with remaining still. She felt well prior to the event Past Medical History:  Diagnosis Date  . Arthritis   . Bilateral edema of lower extremity   . Constipation   . Degenerative arthritis of hip    s/p THR 05/2011  . Depression   . Glaucoma   . Headache(784.0)   . HLD (hyperlipidemia)   . Hypertension   . OAB (overactive bladder)   . Osteoporosis   . Primary osteoarthritis of right knee   . Slow transit constipation   . Unsteady gait     Patient Active Problem List   Diagnosis Date Noted  . Neck pain 08/20/2016  . Pain in left hip 08/20/2016  . History of left hip replacement 08/20/2016  . OA (osteoarthritis) of knee 03/14/2015  . Diastolic dysfunction 42/68/3419  . LBBB (left bundle branch block) 07/19/2013  . Obesity (BMI 30-39.9) 11/11/2012  . Chronic diarrhea   . Hypertension   . OAB (overactive bladder)   . Depression   . Osteopenia   . Symptoms referable to back 01/07/2012  . Cervical spondylosis 01/07/2012  . Degenerative arthritis of hip 06/01/2011  . Cataract 11/17/2010  . Glaucoma 11/17/2010    Past Surgical History:  Procedure Laterality Date  .  cataracts removed  2013 L  . ABDOMINAL HYSTERECTOMY  2003  . BREAST BIOPSY  1960  . BREAST CYSTS     X2 BENIGN  . BREAST  SURGERY    . GLAUCOMA VALVE INSERTION Bilateral 2013, 2014   [facility] ([facility])  . TOTAL HIP ARTHROPLASTY  06/01/2011   Procedure: TOTAL HIP ARTHROPLASTY ANTERIOR APPROACH;  Surgeon: Mcarthur Rossetti, MD;  Location: [facility];  Service: Orthopedics;  Laterality: Left;  Left Total Hip Replacement, Direct Anterior Approach  . TOTAL KNEE ARTHROPLASTY Right 03/14/2015   Procedure: TOTAL KNEE ARTHROPLASTY;  Surgeon: Gaynelle Arabian, MD;  Location: [facility];  Service: Orthopedics;  Laterality: Right;    OB History    No data available       Home Medications    Prior to Admission medications   Medication Sig Start Date End Date Taking? Authorizing Provider  acetaminophen (TYLENOL) 325 MG tablet Take 2 tablets (650 mg total) by mouth every 6 (six) hours as needed for mild pain (or Fever >/= 101). 03/15/15   Perkins, Alexzandrew L, PA-C  aspirin EC 81 MG tablet Take 81 mg by mouth daily. Give for 3 more weeks, stop 04/26/15    [provider]  bisacodyl (DULCOLAX) 10 MG suppository Place 1 suppository (10 mg total) rectally daily as needed for moderate constipation. 03/15/15   Perkins, Alexzandrew L, PA-C  carvedilol (COREG) 12.5 MG tablet take 1 tablet by mouth twice a day 06/09/13   Gwendolyn Grant  A, MD  diphenhydrAMINE (BENADRYL) 12.5 MG/5ML elixir Take 5-10 mLs (12.5-25 mg total) by mouth every 4 (four) hours as needed for itching. 03/15/15   Perkins, Alexzandrew L, PA-C  docusate sodium (COLACE) 100 MG capsule Take 100 mg by mouth 2 (two) times daily.    [provider]  furosemide (LASIX) 20 MG tablet Take 1 tablet (20 mg total) by mouth daily. 04/14/15   Medina-Vargas, Monina C, NP  gabapentin (NEURONTIN) 300 MG capsule Take 1 capsule (300 mg total) by mouth at bedtime. 08/20/16   Mcarthur Rossetti, MD  lisinopril (PRINIVIL,ZESTRIL) 30 MG tablet take 1 tablet by mouth once daily 09/07/13   Rowe Clack, MD  methocarbamol (ROBAXIN) 500 MG tablet Take 1 tablet (500 mg total) by  mouth every 6 (six) hours as needed for muscle spasms. 03/15/15   Perkins, Alexzandrew L, PA-C  methylPREDNISolone (MEDROL) 4 MG tablet Medrol dose pack. Take as instructed 08/30/16   Suzan Slick, NP  metoCLOPramide (REGLAN) 5 MG tablet Take 1 tablet (5 mg total) by mouth every 8 (eight) hours as needed for nausea (if ondansetron (ZOFRAN) ineffective.). 03/15/15   Perkins, Alexzandrew L, PA-C  ondansetron (ZOFRAN) 4 MG tablet Take 1 tablet (4 mg total) by mouth every 6 (six) hours as needed for nausea. 03/15/15   Perkins, Alexzandrew L, PA-C  oxyCODONE (OXY IR/ROXICODONE) 5 MG immediate release tablet Take 1-2 tablets (5-10 mg total) by mouth every 3 (three) hours as needed for moderate pain or severe pain. 03/15/15   Perkins, Alexzandrew L, PA-C  polyethylene glycol (MIRALAX / GLYCOLAX) packet Take 17 g by mouth daily as needed for mild constipation. 03/15/15   Perkins, Alexzandrew L, PA-C  RESTASIS 0.05 % ophthalmic emulsion Place 1 drop into both eyes daily. 01/28/15   [provider]  rivaroxaban (XARELTO) 10 MG TABS tablet Take 1 tablet (10 mg total) by mouth daily with breakfast. Take Xarelto for two and a half more weeks, then discontinue Xarelto. Once the patient has completed the blood thinner regimen, then take a Baby 81 mg Aspirin daily for three more weeks. 03/15/15   Perkins, Alexzandrew L, PA-C  sodium phosphate (FLEET) 7-19 GM/118ML ENEM Place 133 mLs (1 enema total) rectally once as needed for severe constipation. 03/15/15   Perkins, Alexzandrew L, PA-C  timolol (TIMOPTIC) 0.5 % ophthalmic solution Place 1 drop into both eyes 2 (two) times daily. 12/28/14   [provider]  traMADol (ULTRAM) 50 MG tablet Take 1-2 tablets (50-100 mg total) by mouth every 6 (six) hours as needed (mild pain). 03/15/15   Perkins, Alexzandrew L, PA-C    Family History Family History  Problem Relation Age of Onset  . Ovarian cancer Mother   . Alcohol abuse Father   . Stroke Brother      Social History Social History  Substance Use Topics  . Smoking status: Former Smoker    Quit date: 05/27/1980  . Smokeless tobacco: Never Used  . Alcohol use No     Allergies   Patient has no known allergies.   Review of Systems Review of Systems  Constitutional: Negative.   HENT: Negative.   Respiratory: Negative.   Cardiovascular: Positive for chest pain.       Pain at sternum after fall  Gastrointestinal: Negative.   Musculoskeletal: Positive for arthralgias.       Left wrist pain  Skin: Negative.   Neurological: Negative.   Psychiatric/Behavioral: Negative.   All other systems reviewed and are negative.  Physical Exam Updated Vital Signs BP 128/70 (BP Location: Right Arm)   Pulse 62   Temp 97.8 F (36.6 C) (Oral)   Resp 18   SpO2 98%   Physical Exam  Constitutional: She appears well-developed and well-nourished. No distress.  HENT:  Head: Normocephalic and atraumatic.  Eyes: Pupils are equal, round, and reactive to light. Conjunctivae are normal.  Neck: Neck supple. No tracheal deviation present. No thyromegaly present.  Cardiovascular: Normal rate and regular rhythm.   No murmur heard. Pulmonary/Chest: Effort normal and breath sounds normal. She exhibits tenderness.  Only tender overlying sternum. No crepitance no ecchymosis no flail  Abdominal: Soft. Bowel sounds are normal. She exhibits no distension. There is no tenderness.  Musculoskeletal: Normal range of motion. She exhibits no edema or tenderness.  Left upper extremity ecchymotic at the radial aspect of wrist. Skin intact. She is tender at the radial aspect of wrist with minimal anatomic snuffbox tenderness. No gross deformity. Good capillary refill. Radial pulse 2+. All other extremities no contusion abrasion or tenderness neurovascular intact. Entire spine nontender. Pelvis stable nontender.  Neurological: She is alert. Coordination normal.  Skin: Skin is warm and dry. No rash noted.   Psychiatric: She has a normal mood and affect.  Nursing note and vitals reviewed.    ED Treatments / Results  Labs (all labs ordered are listed, but only abnormal results are displayed) Labs Reviewed  BASIC METABOLIC PANEL - Abnormal; Notable for the following:       Result Value   Chloride 100 (*)    Glucose, Bld 100 (*)    BUN 27 (*)    Creatinine, Ser 1.02 (*)    GFR calc non Af Amer 51 (*)    GFR calc Af Amer 59 (*)    All other components within normal limits  CBC  I-STAT TROPONIN, ED  POCT I-STAT TROPONIN I    EKG  EKG Interpretation None       Radiology Dg Chest 2 View  Result Date: 11/02/2016 CLINICAL DATA:  [place] today. EXAM: CHEST  2 VIEW COMPARISON:  Chest x-ray 08/17/2013 FINDINGS: The cardiac silhouette, mediastinal and hilar contours are within normal limits and stable. There is moderate tortuosity and calcification of the thoracic aorta. The lungs are clear. No pleural effusion or pneumothorax. The bony thorax is intact. Remote compression fractures of T11 and T12. IMPRESSION: No acute cardiopulmonary findings. Electronically Signed   By: Marijo Sanes M.D.   On: 11/02/2016 12:34   Dg Wrist Complete Left  Result Date: 11/02/2016 CLINICAL DATA:  [place] today and injured left wrist. EXAM: LEFT WRIST - COMPLETE 3+ VIEW COMPARISON:  None. FINDINGS: Nondisplaced intra-articular fracture of the distal radius near the base of the radial styloid. No ulnar fracture. Widened scapholunate joint space suggesting ligament injury. The carpal bones are intact and the visualized metacarpal bones are intact. IMPRESSION: Nondisplaced intra-articular fracture of the distal radius. Widened scapholunate joint space suggesting a ligament injury. Electronically Signed   By: Marijo Sanes M.D.   On: 11/02/2016 12:33    Procedures Procedures (including critical care time)  Medications Ordered in ED Medications  ibuprofen (ADVIL,MOTRIN) tablet 400 mg (not administered)   HYDROcodone-acetaminophen (NORCO/VICODIN) 5-325 MG per tablet 1 tablet (not administered)     Initial Impression / Assessment and Plan / ED Course  I have reviewed the triage vital signs and the nursing notes.  Pertinent labs & imaging results that were available during my care of the patient were reviewed by  me and considered in my medical decision making (see chart for details).     X-rays viewed by me. Patient requests [facility] for follow-up. I discussed case with Dr.Duda commends sugar tong splint, sling, we'll write prescription for Norco. Follow-up in office next week with Dr.XU. [place] Controlled Substance reporting System queried Splint and sling placed by orthopedic technician. Checked by me. Comfortable for patient. Patient with good capillary refill after splint attachment. She feels ready to go home Final Clinical Impressions(s) / ED Diagnoses  Diagnoses #1 closed fracture of left wrist #2 chest wall pain #3 fall Final diagnoses:  None    New Prescriptions New Prescriptions   No medications on file     Orlie Dakin, MD 11/02/16 [phone]

## 2016-11-02 NOTE — ED Triage Notes (Signed)
Per EMS. Pt from home. Pt had fall today when she tripped due to her shoes. No dizziness or LOC. Pt's main complaint is L wrist pain, no deformity. Pt ambulatory on scene. Otherwise does complain of sternal pain after fall that is worse with palpation and movement.

## 2016-11-02 NOTE — Discharge Instructions (Signed)
Take Tylenol for mild pain or the pain medicine prescribed for bad pain. Don't take Tylenol together with the pain medicine prescribed as the combination can be dangerous to your liver. Call the [facility] orthopedic office on Monday, 11/05/2016 to schedule appointment with Dr.Xu. Tell office staff that you were seen here and that [place] spoke with Dr.Duda about your case

## 2016-11-08 ENCOUNTER — Ambulatory Visit (INDEPENDENT_AMBULATORY_CARE_PROVIDER_SITE_OTHER): Payer: Medicare Other | Admitting: Orthopaedic Surgery

## 2016-11-09 ENCOUNTER — Ambulatory Visit (INDEPENDENT_AMBULATORY_CARE_PROVIDER_SITE_OTHER): Payer: Medicare Other | Admitting: Orthopaedic Surgery

## 2016-11-09 DIAGNOSIS — S52592A Other fractures of lower end of left radius, initial encounter for closed fracture: Secondary | ICD-10-CM

## 2016-11-09 DIAGNOSIS — S52502A Unspecified fracture of the lower end of left radius, initial encounter for closed fracture: Secondary | ICD-10-CM | POA: Insufficient documentation

## 2016-11-09 NOTE — Progress Notes (Signed)
Office Visit Note   Patient: [name]           Date of Birth: 1935/11/10           MRN: [id number] Visit Date: 11/09/2016              Requested by: Levin Erp, MD [place], [place] [address] PCP: Levin Erp, MD   Assessment & Plan: Visit Diagnoses:  1. Other closed fracture of distal end of left radius, initial encounter     Plan: Patient has a nondisplaced distal radius fracture which is amenable to nonoperative treatment. Left wrist brace applied today. Weightbearing and activity as tolerated. Questions encouraged and answered. Follow-up in 4 weeks for recheck. No x-rays needed.  Follow-Up Instructions: Return in about 4 weeks (around 12/07/2016).   Orders:  No orders of the defined types were placed in this encounter.  No orders of the defined types were placed in this encounter.     Procedures: No procedures performed   Clinical Data: No additional findings.   Subjective: Chief Complaint  Patient presents with  . Left Wrist - Pain, Fracture    Patient is a 81 year old female who had a mechanical fall onto her left wrist on 11/02/2016. She does not endorse any significant pain. Denies any numbness and tingling. Pain is slightly worse with palpation and touch. She has been wearing a sugar tong splint.    Review of Systems  Constitutional: Negative.   HENT: Negative.   Eyes: Negative.   Respiratory: Negative.   Cardiovascular: Negative.   Endocrine: Negative.   Musculoskeletal: Negative.   Neurological: Negative.   Hematological: Negative.   Psychiatric/Behavioral: Negative.   All other systems reviewed and are negative.    Objective: Vital Signs: There were no vitals taken for this visit.  Physical Exam  Constitutional: She is oriented to person, place, and time. She appears well-developed and well-nourished.  HENT:  Head: Normocephalic and atraumatic.  Eyes: EOM are normal.  Neck: Neck supple.    Pulmonary/Chest: Effort normal.  Abdominal: Soft.  Neurological: She is alert and oriented to person, place, and time.  Skin: Skin is warm. Capillary refill takes less than 2 seconds.  Psychiatric: She has a normal mood and affect. Her behavior is normal. Judgment and thought content normal.  Nursing note and vitals reviewed.   Ortho Exam Left wrist exam shows mild tenderness over the dorsal aspect of the distal radius near Lister's tubercle. She has no focal motor or sensory deficits. Scapholunate interval is nontender. Specialty Comments:  No specialty comments available.  Imaging: No results found.   PMFS History: Patient Active Problem List   Diagnosis Date Noted  . Closed fracture of left distal radius 11/09/2016  . Neck pain 08/20/2016  . Pain in left hip 08/20/2016  . History of left hip replacement 08/20/2016  . OA (osteoarthritis) of knee 03/14/2015  . Diastolic dysfunction 73/41/9379  . LBBB (left bundle branch block) 07/19/2013  . Obesity (BMI 30-39.9) 11/11/2012  . Chronic diarrhea   . Hypertension   . OAB (overactive bladder)   . Depression   . Osteopenia   . Symptoms referable to back 01/07/2012  . Cervical spondylosis 01/07/2012  . Degenerative arthritis of hip 06/01/2011  . Cataract 11/17/2010  . Glaucoma 11/17/2010   Past Medical History:  Diagnosis Date  . Arthritis   . Bilateral edema of lower extremity   . Constipation   . Degenerative arthritis of hip  s/p THR 05/2011  . Depression   . Glaucoma   . Headache(784.0)   . HLD (hyperlipidemia)   . Hypertension   . OAB (overactive bladder)   . Osteoporosis   . Primary osteoarthritis of right knee   . Slow transit constipation   . Unsteady gait     Family History  Problem Relation Age of Onset  . Ovarian cancer Mother   . Alcohol abuse Father   . Stroke Brother     Past Surgical History:  Procedure Laterality Date  .  cataracts removed  2013 L  . ABDOMINAL HYSTERECTOMY  2003  .  BREAST BIOPSY  1960  . BREAST CYSTS     X2 BENIGN  . BREAST SURGERY    . GLAUCOMA VALVE INSERTION Bilateral 2013, 2014   [facility] ([facility])  . TOTAL HIP ARTHROPLASTY  06/01/2011   Procedure: TOTAL HIP ARTHROPLASTY ANTERIOR APPROACH;  Surgeon: Mcarthur Rossetti, MD;  Location: [facility];  Service: Orthopedics;  Laterality: Left;  Left Total Hip Replacement, Direct Anterior Approach  . TOTAL KNEE ARTHROPLASTY Right 03/14/2015   Procedure: TOTAL KNEE ARTHROPLASTY;  Surgeon: Gaynelle Arabian, MD;  Location: [facility];  Service: Orthopedics;  Laterality: Right;   Social History   Occupational History  . Not on file.   Social History Main Topics  . Smoking status: Former Smoker    Quit date: 05/27/1980  . Smokeless tobacco: Never Used  . Alcohol use No  . Drug use: No  . Sexual activity: Not on file

## 2016-11-13 DIAGNOSIS — R072 Precordial pain: Secondary | ICD-10-CM | POA: Diagnosis not present

## 2016-11-16 ENCOUNTER — Ambulatory Visit (INDEPENDENT_AMBULATORY_CARE_PROVIDER_SITE_OTHER): Payer: Medicare Other | Admitting: Orthopaedic Surgery

## 2016-11-29 ENCOUNTER — Other Ambulatory Visit: Payer: Self-pay | Admitting: Internal Medicine

## 2016-11-29 DIAGNOSIS — Z1231 Encounter for screening mammogram for malignant neoplasm of breast: Secondary | ICD-10-CM

## 2016-11-30 ENCOUNTER — Ambulatory Visit: Payer: Medicare Other

## 2016-12-06 ENCOUNTER — Ambulatory Visit
Admission: RE | Admit: 2016-12-06 | Discharge: 2016-12-06 | Disposition: A | Discharge status: Home / Self Care | Payer: Medicare Other | Source: Ambulatory Visit | Attending: Internal Medicine | Admitting: Internal Medicine

## 2016-12-06 DIAGNOSIS — Z1231 Encounter for screening mammogram for malignant neoplasm of breast: Secondary | ICD-10-CM | POA: Diagnosis not present

## 2016-12-07 ENCOUNTER — Ambulatory Visit: Payer: Medicare Other

## 2016-12-07 ENCOUNTER — Ambulatory Visit (INDEPENDENT_AMBULATORY_CARE_PROVIDER_SITE_OTHER): Payer: Medicare Other | Admitting: Orthopaedic Surgery

## 2016-12-26 ENCOUNTER — Ambulatory Visit: Payer: Medicare Other | Admitting: Diagnostic Neuroimaging

## 2016-12-28 ENCOUNTER — Ambulatory Visit: Payer: Medicare Other

## 2017-02-04 ENCOUNTER — Encounter (INDEPENDENT_AMBULATORY_CARE_PROVIDER_SITE_OTHER): Payer: Self-pay

## 2017-02-04 ENCOUNTER — Encounter: Payer: Self-pay | Admitting: Diagnostic Neuroimaging

## 2017-02-04 ENCOUNTER — Ambulatory Visit (INDEPENDENT_AMBULATORY_CARE_PROVIDER_SITE_OTHER): Payer: Medicare Other | Admitting: Diagnostic Neuroimaging

## 2017-02-04 VITALS — BP 104/67 | HR 62 | Ht 60.0 in | Wt 178.2 lb

## 2017-02-04 DIAGNOSIS — R202 Paresthesia of skin: Secondary | ICD-10-CM | POA: Diagnosis not present

## 2017-02-04 DIAGNOSIS — R413 Other amnesia: Secondary | ICD-10-CM | POA: Diagnosis not present

## 2017-02-04 DIAGNOSIS — M48061 Spinal stenosis, lumbar region without neurogenic claudication: Secondary | ICD-10-CM | POA: Diagnosis not present

## 2017-02-04 DIAGNOSIS — R200 Anesthesia of skin: Secondary | ICD-10-CM | POA: Diagnosis not present

## 2017-02-04 NOTE — Patient Instructions (Addendum)
Thank you for coming to see [place] at [facility]. I hope we have been able to provide you high quality care today.  You may receive a patient satisfaction survey over the next few weeks. We would appreciate your feedback and comments so that we may continue to improve ourselves and the health of our patients.  LEFT HAND NUMBNESS - check EMG/NCS - then consider steroid injection and carpal tunnel release surgery  LOWER EXT NUMBNESS AND GAIT DIFF (severe lumbar spinal stenosis) - PT evaluation  MEMORY LOSS      - MRI brain   ~~~~~~~~~~~~~~~~~~~~~~~~~~~~~~~~~~~~~~~~~~~~~~~~~~~~~~~~~~~~~~~~~  DR. PENUMALLI'S GUIDE TO HAPPY AND HEALTHY LIVING These are some of my general health and wellness recommendations. Some of them may apply to you better than others. Please use common sense as you try these suggestions and feel free to ask me any questions.   ACTIVITY/FITNESS Mental, social, emotional and physical stimulation are very important for brain and body health. Try learning a new activity (arts, music, language, sports, games).  Keep moving your body to the best of your abilities. You can do this at home, inside or outside, the park, community center, gym or anywhere you like. Consider a physical therapist or personal trainer to get started. Consider the app Sworkit. Fitness trackers such as smart-watches, smart-phones or Fitbits can help as well.   NUTRITION Eat more plants: colorful vegetables, nuts, seeds and berries.  Eat less sugar, salt, preservatives and processed foods.  Avoid toxins such as cigarettes and alcohol.  Drink water when you are thirsty. Warm water with a slice of lemon is an excellent morning drink to start the day.  Consider these websites for more information The Nutrition Source (https://www.henry-hernandez.biz/) Precision Nutrition (WindowBlog.ch)   RELAXATION Consider practicing mindfulness  meditation or other relaxation techniques such as deep breathing, prayer, yoga, tai chi, massage. See website mindful.org or the apps Headspace or Calm to help get started.   SLEEP Try to get at least 7-8+ hours sleep per day. Regular exercise and reduced caffeine will help you sleep better. Practice good sleep hygeine techniques. See website sleep.org for more information.   PLANNING Prepare estate planning, living will, healthcare POA documents. Sometimes this is best planned with the help of an attorney. Theconversationproject.org and agingwithdignity.org are excellent resources.
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
## 2017-02-12 ENCOUNTER — Other Ambulatory Visit: Payer: Medicare Other

## 2017-02-14 ENCOUNTER — Telehealth: Payer: Self-pay | Admitting: Diagnostic Neuroimaging

## 2017-02-14 ENCOUNTER — Encounter: Payer: Medicare Other | Admitting: Diagnostic Neuroimaging
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
## 2017-02-14 NOTE — Telephone Encounter (Signed)
Noted  

## 2017-02-15 ENCOUNTER — Encounter: Payer: Self-pay | Admitting: Diagnostic Neuroimaging

## 2017-02-16 ENCOUNTER — Ambulatory Visit
Admission: RE | Admit: 2017-02-16 | Discharge: 2017-02-16 | Disposition: A | Discharge status: Home / Self Care | Payer: Medicare Other | Source: Ambulatory Visit | Attending: Diagnostic Neuroimaging | Admitting: Diagnostic Neuroimaging

## 2017-02-16 DIAGNOSIS — R413 Other amnesia: Secondary | ICD-10-CM

## 2017-02-19 ENCOUNTER — Telehealth: Payer: Self-pay | Admitting: *Deleted

## 2017-02-19 NOTE — Telephone Encounter (Signed)
-----   Message from Penni Bombard, MD sent at 02/18/2017  2:13 PM EST ----- Mild atrophy and scar tissue (chronic small vessel ischemic disease). No other major findings. Please call patient. Continue current plan. -VRP

## 2017-02-19 NOTE — Telephone Encounter (Signed)
I spoke to son, [name], (ok per DPR) that per Dr. Leta Baptist, MRI brain results showed mild atropy, Chronic SVD, no other major findings.  Continue current plan. He verbalized understanding.    pt has appt for [facility]/EMG 02/28/2017, he will discuss when in.  Was not aware of pt getting PT at [facility].  No referral noted. He will discuss when in the office.

## 2017-02-21 DIAGNOSIS — D485 Neoplasm of uncertain behavior of skin: Secondary | ICD-10-CM | POA: Diagnosis not present

## 2017-02-21 DIAGNOSIS — I8312 Varicose veins of left lower extremity with inflammation: Secondary | ICD-10-CM | POA: Diagnosis not present

## 2017-02-21 DIAGNOSIS — H52203 Unspecified astigmatism, bilateral: Secondary | ICD-10-CM | POA: Diagnosis not present

## 2017-02-21 DIAGNOSIS — H4720 Unspecified optic atrophy: Secondary | ICD-10-CM | POA: Diagnosis not present

## 2017-02-21 DIAGNOSIS — I8311 Varicose veins of right lower extremity with inflammation: Secondary | ICD-10-CM | POA: Diagnosis not present

## 2017-02-21 DIAGNOSIS — D0472 Carcinoma in situ of skin of left lower limb, including hip: Secondary | ICD-10-CM | POA: Diagnosis not present

## 2017-02-21 DIAGNOSIS — H401133 Primary open-angle glaucoma, bilateral, severe stage: Secondary | ICD-10-CM | POA: Diagnosis not present

## 2017-02-21 DIAGNOSIS — H04123 Dry eye syndrome of bilateral lacrimal glands: Secondary | ICD-10-CM | POA: Diagnosis not present

## 2017-02-21 DIAGNOSIS — I872 Venous insufficiency (chronic) (peripheral): Secondary | ICD-10-CM | POA: Diagnosis not present

## 2017-02-28 ENCOUNTER — Encounter (INDEPENDENT_AMBULATORY_CARE_PROVIDER_SITE_OTHER): Payer: Medicare Other | Admitting: Diagnostic Neuroimaging

## 2017-02-28 ENCOUNTER — Ambulatory Visit (INDEPENDENT_AMBULATORY_CARE_PROVIDER_SITE_OTHER): Payer: Medicare Other | Admitting: Diagnostic Neuroimaging

## 2017-02-28 DIAGNOSIS — R202 Paresthesia of skin: Secondary | ICD-10-CM

## 2017-02-28 DIAGNOSIS — R200 Anesthesia of skin: Secondary | ICD-10-CM

## 2017-02-28 DIAGNOSIS — Z0289 Encounter for other administrative examinations: Secondary | ICD-10-CM

## 2017-03-04 NOTE — Procedures (Signed)
[facility]  NCS (NERVE CONDUCTION STUDY) WITH EMG (ELECTROMYOGRAPHY) REPORT   STUDY DATE: 02/28/17 PATIENT NAME: [name] DOB: November 07, 1935 MRN: [id number]  ORDERING CLINICIAN: Andrey Spearman, MD   TECHNOLOGIST: Oneita Jolly  ELECTROMYOGRAPHER: Earlean Polka. Haley Roza, MD  CLINICAL INFORMATION: 82 year old female with numbness in left hand.  FINDINGS: NERVE CONDUCTION STUDY: Left median motor response could not be obtained.  Right median motor response is prolonged distal latency, normal amplitude.  Bilateral ulnar motor responses and F wave latencies are normal.  There is a right median to ulnar anastomosis (Martin-Gruber normal variant).  Left median sensory response could not be obtained.  Right median sensory response has prolonged peak latency and decreased amplitude.  Bilateral ulnar sensory responses are normal.   NEEDLE ELECTROMYOGRAPHY: Needle examination of left upper semi-deltoid, biceps, triceps, flexor carpi radialis, first dorsal interosseous is normal.   IMPRESSION:   Abnormal study demonstrating: - Severe left median neuropathy at the wrist consistent with severe left carpal tunnel syndrome. - Mild right median neuropathy at the wrist consistent with mild right carpal tunnel syndrome.    INTERPRETING PHYSICIAN:  Penni Bombard, MD Certified in Neurology, Neurophysiology and Neuroimaging  [facility] [address], [place], [place] [address] [phone]  [facility]    Nerve / Sites Muscle Latency Ref. Amplitude Ref. Rel Amp Segments Distance Velocity Ref. Area    ms ms mV mV %  cm m/s m/s mVms  L Median - APB     Wrist APB NR ?4.4 NR ?4.0 NR Wrist - APB 7   NR  R Median - APB     Wrist APB 4.5 ?4.4 6.0 ?4.0 100 Wrist - APB 7   19.3     Upper arm APB 7.6  4.6  76.6 Upper arm - Wrist 21 68 ?49 15.7  L Ulnar - ADM     Wrist ADM 2.6 ?3.3 10.0 ?6.0 100 Wrist - ADM 7   34.1     B.Elbow ADM 6.0  8.9   89.1 B.Elbow - Wrist 17 50 ?49 32.2     A.Elbow ADM 8.0  8.7  97.3 A.Elbow - B.Elbow 10 51 ?49 31.5         A.Elbow - Wrist      R Ulnar - ADM     Wrist ADM 2.7 ?3.3 9.6 ?6.0 100 Wrist - ADM 7   35.1     B.Elbow ADM 5.7  8.4  87 B.Elbow - Wrist 17 55 ?49 29.0     A.Elbow ADM 7.9  8.7  104 A.Elbow - B.Elbow 12 55 ?49 28.5         A.Elbow - Wrist      R Median, Ulnar - Martin-Gruber Anastomosis     Ulnar Wrist ADM 2.7  11.2  100     34.6     Ulnar Elbow ADM 6.2  9.9  88.4     32.4     Median Wrist ADM 4.4  0.6  5.84     1.5     Median Elbow ADM NR  NR  NR     NR     Ulnar Wrist FDI 3.4  10.9       21.4     Ulnar Elbow FDI 7.1  8.0  73.2     15.4     Median Wrist FDI 5.4  1.3  15.7     3.5     Median Elbow FDI 8.2  2.1  167     5.2               SNC    Nerve / Sites Rec. Site Peak Lat Ref.  Amp Ref. Segments Distance    ms ms V V  cm  L Median - Orthodromic (Dig II, Mid palm)     Dig II Wrist NR ?3.4 NR ?10 Dig II - Wrist 13  R Median - Orthodromic (Dig II, Mid palm)     Dig II Wrist 4.1 ?3.4 8 ?10 Dig II - Wrist 13  L Ulnar - Orthodromic, (Dig V, Mid palm)     Dig V Wrist 2.7 ?3.1 6 ?5 Dig V - Wrist 11  R Ulnar - Orthodromic, (Dig V, Mid palm)     Dig V Wrist 2.7 ?3.1 6 ?5 Dig V - Wrist 56             F  Wave    Nerve F Lat Ref.   ms ms  L Ulnar - ADM 26.9 ?32.0  R Ulnar - ADM 27.6 ?32.0         EMG full       EMG Summary Table    Spontaneous MUAP Recruitment  Muscle IA Fib PSW Fasc Other Amp Dur. Poly Pattern  L. Deltoid Normal None None None _______ Normal Normal Normal Normal  L. Biceps brachii Normal None None None _______ Normal Normal Normal Normal  L. Triceps brachii Normal None None None _______ Normal Normal Normal Normal  L. Flexor carpi radialis Normal None None None _______ Normal Normal Normal Normal  L. First dorsal interosseous Normal None None None _______ Normal Normal Normal Normal

## 2017-03-14 DIAGNOSIS — D0472 Carcinoma in situ of skin of left lower limb, including hip: Secondary | ICD-10-CM | POA: Diagnosis not present

## 2017-04-11 ENCOUNTER — Telehealth: Payer: Self-pay | Admitting: Diagnostic Neuroimaging

## 2017-04-11 DIAGNOSIS — G5602 Carpal tunnel syndrome, left upper limb: Secondary | ICD-10-CM

## 2017-04-11 NOTE — Telephone Encounter (Signed)
Pt is wanting a referral to [facility] for CTS. Please call her when this has been sent

## 2017-04-24 ENCOUNTER — Telehealth (INDEPENDENT_AMBULATORY_CARE_PROVIDER_SITE_OTHER): Payer: Self-pay | Admitting: Orthopaedic Surgery

## 2017-04-24 NOTE — Telephone Encounter (Signed)
Patient wanted medical records released, wanted form to be mailed. Done 04/24/17

## 2017-05-01 ENCOUNTER — Telehealth (INDEPENDENT_AMBULATORY_CARE_PROVIDER_SITE_OTHER): Payer: Self-pay | Admitting: Orthopaedic Surgery

## 2017-05-01 NOTE — Telephone Encounter (Signed)
Can you please make for patient. Thank you.

## 2017-05-01 NOTE — Telephone Encounter (Signed)
Patient called asking for a CD with all her xrays on it of her wrist/hand. Needing the CD soon so she can make an appointment with a hand specialist. CB # [phone]

## 2017-05-01 NOTE — Telephone Encounter (Signed)
I called and lmom that xrays were done at [facility] that she would need to call them @ [phone] (ask for Radiology) and get them to make the CD for, I also advised that the xrays were from 11/02/2016 and that she may want to call the Hand Dr that she will be going to and see if they so do need the xrays.

## 2017-05-06 DIAGNOSIS — I10 Essential (primary) hypertension: Secondary | ICD-10-CM | POA: Diagnosis not present

## 2017-05-06 DIAGNOSIS — M25552 Pain in left hip: Secondary | ICD-10-CM | POA: Diagnosis not present

## 2017-05-06 DIAGNOSIS — Z6835 Body mass index (BMI) 35.0-35.9, adult: Secondary | ICD-10-CM | POA: Diagnosis not present

## 2017-05-06 DIAGNOSIS — M542 Cervicalgia: Secondary | ICD-10-CM | POA: Diagnosis not present

## 2017-05-06 DIAGNOSIS — F3289 Other specified depressive episodes: Secondary | ICD-10-CM | POA: Diagnosis not present

## 2017-05-06 DIAGNOSIS — Z1389 Encounter for screening for other disorder: Secondary | ICD-10-CM | POA: Diagnosis not present

## 2017-05-15 DIAGNOSIS — S52512A Displaced fracture of left radial styloid process, initial encounter for closed fracture: Secondary | ICD-10-CM | POA: Diagnosis not present

## 2017-05-15 DIAGNOSIS — M542 Cervicalgia: Secondary | ICD-10-CM | POA: Diagnosis not present

## 2017-05-15 DIAGNOSIS — G5602 Carpal tunnel syndrome, left upper limb: Secondary | ICD-10-CM | POA: Diagnosis not present

## 2017-05-23 DIAGNOSIS — H401123 Primary open-angle glaucoma, left eye, severe stage: Secondary | ICD-10-CM | POA: Diagnosis not present

## 2017-05-23 DIAGNOSIS — H401112 Primary open-angle glaucoma, right eye, moderate stage: Secondary | ICD-10-CM | POA: Diagnosis not present

## 2017-05-29 DIAGNOSIS — G5602 Carpal tunnel syndrome, left upper limb: Secondary | ICD-10-CM | POA: Diagnosis not present

## 2017-05-29 DIAGNOSIS — S52512A Displaced fracture of left radial styloid process, initial encounter for closed fracture: Secondary | ICD-10-CM | POA: Diagnosis not present

## 2017-05-30 ENCOUNTER — Other Ambulatory Visit: Payer: Self-pay | Admitting: Orthopedic Surgery

## 2017-06-04 ENCOUNTER — Ambulatory Visit (INDEPENDENT_AMBULATORY_CARE_PROVIDER_SITE_OTHER): Payer: Self-pay | Admitting: Physical Medicine and Rehabilitation

## 2017-06-18 ENCOUNTER — Ambulatory Visit (HOSPITAL_BASED_OUTPATIENT_CLINIC_OR_DEPARTMENT_OTHER): Admit: 2017-06-18 | Payer: Medicare Other | Admitting: Orthopedic Surgery

## 2017-06-18 ENCOUNTER — Encounter (HOSPITAL_BASED_OUTPATIENT_CLINIC_OR_DEPARTMENT_OTHER): Payer: Self-pay

## 2017-06-18 SURGERY — CARPAL TUNNEL RELEASE
Anesthesia: Regional | Laterality: Left

## 2017-08-05 ENCOUNTER — Telehealth: Payer: Self-pay | Admitting: *Deleted

## 2017-08-05 ENCOUNTER — Ambulatory Visit: Payer: Medicare Other | Admitting: Diagnostic Neuroimaging

## 2017-08-05 NOTE — Telephone Encounter (Signed)
Pt no showed appointment today

## 2017-08-06 ENCOUNTER — Encounter: Payer: Self-pay | Admitting: Diagnostic Neuroimaging

## 2017-08-12 DIAGNOSIS — M79662 Pain in left lower leg: Secondary | ICD-10-CM | POA: Diagnosis not present

## 2017-08-12 DIAGNOSIS — Z6833 Body mass index (BMI) 33.0-33.9, adult: Secondary | ICD-10-CM | POA: Diagnosis not present

## 2017-08-12 DIAGNOSIS — M7989 Other specified soft tissue disorders: Secondary | ICD-10-CM | POA: Diagnosis not present

## 2017-08-12 DIAGNOSIS — R0602 Shortness of breath: Secondary | ICD-10-CM | POA: Diagnosis not present

## 2017-08-12 DIAGNOSIS — R600 Localized edema: Secondary | ICD-10-CM | POA: Diagnosis not present

## 2017-08-12 DIAGNOSIS — J189 Pneumonia, unspecified organism: Secondary | ICD-10-CM | POA: Diagnosis not present

## 2017-09-06 DIAGNOSIS — M7541 Impingement syndrome of right shoulder: Secondary | ICD-10-CM | POA: Diagnosis not present

## 2017-09-06 DIAGNOSIS — M25511 Pain in right shoulder: Secondary | ICD-10-CM | POA: Diagnosis not present

## 2017-11-18 DIAGNOSIS — H401112 Primary open-angle glaucoma, right eye, moderate stage: Secondary | ICD-10-CM | POA: Diagnosis not present

## 2017-11-18 DIAGNOSIS — H401123 Primary open-angle glaucoma, left eye, severe stage: Secondary | ICD-10-CM | POA: Diagnosis not present

## 2017-12-12 DIAGNOSIS — Z85828 Personal history of other malignant neoplasm of skin: Secondary | ICD-10-CM | POA: Diagnosis not present

## 2017-12-12 DIAGNOSIS — R609 Edema, unspecified: Secondary | ICD-10-CM | POA: Diagnosis not present

## 2018-02-06 DIAGNOSIS — H16103 Unspecified superficial keratitis, bilateral: Secondary | ICD-10-CM | POA: Diagnosis not present

## 2018-02-06 DIAGNOSIS — H401133 Primary open-angle glaucoma, bilateral, severe stage: Secondary | ICD-10-CM | POA: Diagnosis not present

## 2018-02-06 DIAGNOSIS — H04123 Dry eye syndrome of bilateral lacrimal glands: Secondary | ICD-10-CM | POA: Diagnosis not present

## 2018-02-06 DIAGNOSIS — H52203 Unspecified astigmatism, bilateral: Secondary | ICD-10-CM | POA: Diagnosis not present

## 2018-07-24 DIAGNOSIS — H401112 Primary open-angle glaucoma, right eye, moderate stage: Secondary | ICD-10-CM | POA: Diagnosis not present

## 2018-07-24 DIAGNOSIS — H401123 Primary open-angle glaucoma, left eye, severe stage: Secondary | ICD-10-CM | POA: Diagnosis not present

## 2018-09-02 DIAGNOSIS — R159 Full incontinence of feces: Secondary | ICD-10-CM | POA: Diagnosis not present

## 2018-09-02 DIAGNOSIS — R197 Diarrhea, unspecified: Secondary | ICD-10-CM | POA: Diagnosis not present

## 2018-09-02 DIAGNOSIS — R600 Localized edema: Secondary | ICD-10-CM | POA: Diagnosis not present

## 2018-09-26 DIAGNOSIS — R635 Abnormal weight gain: Secondary | ICD-10-CM | POA: Diagnosis not present

## 2018-09-26 DIAGNOSIS — I10 Essential (primary) hypertension: Secondary | ICD-10-CM | POA: Diagnosis not present

## 2018-09-26 DIAGNOSIS — R0609 Other forms of dyspnea: Secondary | ICD-10-CM | POA: Diagnosis not present

## 2018-09-26 DIAGNOSIS — R0602 Shortness of breath: Secondary | ICD-10-CM | POA: Diagnosis not present

## 2018-09-26 DIAGNOSIS — R600 Localized edema: Secondary | ICD-10-CM | POA: Diagnosis not present

## 2018-10-02 DIAGNOSIS — E7849 Other hyperlipidemia: Secondary | ICD-10-CM | POA: Diagnosis not present

## 2018-10-09 DIAGNOSIS — M542 Cervicalgia: Secondary | ICD-10-CM | POA: Diagnosis not present

## 2018-10-09 DIAGNOSIS — Z1339 Encounter for screening examination for other mental health and behavioral disorders: Secondary | ICD-10-CM | POA: Diagnosis not present

## 2018-10-09 DIAGNOSIS — Z0000 Encounter for general adult medical examination without abnormal findings: Secondary | ICD-10-CM | POA: Diagnosis not present

## 2018-10-09 DIAGNOSIS — E785 Hyperlipidemia, unspecified: Secondary | ICD-10-CM | POA: Diagnosis not present

## 2018-10-09 DIAGNOSIS — M5136 Other intervertebral disc degeneration, lumbar region: Secondary | ICD-10-CM | POA: Diagnosis not present

## 2018-10-09 DIAGNOSIS — R0609 Other forms of dyspnea: Secondary | ICD-10-CM | POA: Diagnosis not present

## 2018-10-22 DIAGNOSIS — H401123 Primary open-angle glaucoma, left eye, severe stage: Secondary | ICD-10-CM | POA: Diagnosis not present

## 2018-10-22 DIAGNOSIS — H401112 Primary open-angle glaucoma, right eye, moderate stage: Secondary | ICD-10-CM | POA: Diagnosis not present

## 2018-11-06 ENCOUNTER — Encounter: Payer: Medicare Other | Admitting: Physical Medicine and Rehabilitation

## 2018-11-10 ENCOUNTER — Ambulatory Visit: Payer: Medicare Other | Admitting: Orthopaedic Surgery

## 2018-11-11 ENCOUNTER — Other Ambulatory Visit: Payer: Self-pay | Admitting: Internal Medicine

## 2018-11-11 DIAGNOSIS — Z1231 Encounter for screening mammogram for malignant neoplasm of breast: Secondary | ICD-10-CM

## 2018-11-12 ENCOUNTER — Encounter: Payer: Self-pay | Admitting: Orthopaedic Surgery

## 2018-11-12 ENCOUNTER — Other Ambulatory Visit: Payer: Self-pay

## 2018-11-12 ENCOUNTER — Ambulatory Visit (INDEPENDENT_AMBULATORY_CARE_PROVIDER_SITE_OTHER): Payer: Medicare Other | Admitting: Orthopaedic Surgery

## 2018-11-12 ENCOUNTER — Ambulatory Visit (INDEPENDENT_AMBULATORY_CARE_PROVIDER_SITE_OTHER): Payer: Medicare Other

## 2018-11-12 DIAGNOSIS — M25552 Pain in left hip: Secondary | ICD-10-CM

## 2018-11-12 DIAGNOSIS — Z96642 Presence of left artificial hip joint: Secondary | ICD-10-CM | POA: Diagnosis not present

## 2018-11-12 NOTE — Progress Notes (Signed)
Office Visit Note   Patient: [name]           Date of Birth: Dec 12, 1935           MRN: [id number] Visit Date: 11/12/2018              Requested by: Levin Erp, MD [place] [place],  [place] [address] PCP: Levin Erp, MD   Assessment & Plan: Visit Diagnoses:  1. Pain in left hip   2. History of total hip arthroplasty, left     Plan: I did go over her x-rays with her in detail and showed her hip model.  I do believe that there is loosening of the acetabular component.  I talked about the possibility of surgery but she is declined this for now.  She would like to try steroid injection of her trochanteric area and see how this helps.  I felt that was reasonable to try that today however in the long run based on her x-ray she will likely need to have some type of revision of the acetabular component and this could improve her leg length as well.  I understand her caution being the fact that she is 82 years old and has osteoporosis.  She tolerated steroid injection well today.  I would like to see her back in 4 weeks to see how she is doing overall but no x-rays are needed.  Follow-Up Instructions: Return in about 4 weeks (around 12/10/2018).   Orders:  Orders Placed This Encounter  Procedures  . XR HIP UNILAT W OR W/O PELVIS 1V LEFT   No orders of the defined types were placed in this encounter.     Procedures: No procedures performed   Clinical Data: No additional findings.   Subjective: Chief Complaint  Patient presents with  . Left Hip - Follow-up  The patient is a very pleasant 83 year old female who comes in for evaluation treatment of left hip pain.  She points the trochanteric area as a source of her pain.  She says become a constant ache.  She has had multiple falls.  We did replace her left hip through an anterior approach 7 years ago.  It is been doing well for a long period time but after multiple falls she says it hurts more than it had  before.  She denies any groin pain.  She does report slight leg wound discrepancy with her left side shorter than the right.  She reports significant osteoporosis as well.  HPI  Review of Systems She currently denies any headache, chest pain, shortness of breath, fever, chills, nausea, vomiting  Objective: Vital Signs: There were no vitals taken for this visit.  Physical Exam She is alert and orient x3 and in no acute distress Ortho Exam Examination of her left hip shows full range of motion of left hip with no instability on stressing the hip.  She does have a lot of pain to palpation of the trochanteric area.  There is a leg length discrepancy with her left leg shorter than the right. Specialty Comments:  No specialty comments available.  Imaging: No results found.   PMFS History: Patient Active Problem List   Diagnosis Date Noted  . History of total hip arthroplasty, left 11/12/2018  . Closed fracture of left distal radius 11/09/2016  . Neck pain 08/20/2016  . Pain in left hip 08/20/2016  . History of left hip replacement 08/20/2016  . OA (osteoarthritis) of knee 03/14/2015  .  Diastolic dysfunction [id number]  . LBBB (left bundle branch block) 07/19/2013  . Obesity (BMI 30-39.9) 11/11/2012  . Chronic diarrhea   . Hypertension   . OAB (overactive bladder)   . Depression   . Osteopenia   . Symptoms referable to back 01/07/2012  . Cervical spondylosis 01/07/2012  . Degenerative arthritis of hip 06/01/2011  . Cataract 11/17/2010  . Glaucoma 11/17/2010   Past Medical History:  Diagnosis Date  . Arthritis   . Bilateral edema of lower extremity   . Constipation   . Degenerative arthritis of hip    s/p THR 05/2011  . Depression   . Glaucoma   . Headache(784.0)   . HLD (hyperlipidemia)   . Hypertension   . OAB (overactive bladder)   . Osteoporosis   . Primary osteoarthritis of right knee   . Slow transit constipation   . Unsteady gait     Family History   Problem Relation Age of Onset  . Ovarian cancer Mother   . Alcohol abuse Father   . Stroke Brother     Past Surgical History:  Procedure Laterality Date  .  cataracts removed  2013 L  . ABDOMINAL HYSTERECTOMY  2003  . BREAST BIOPSY  1960  . BREAST CYSTS     X2 BENIGN  . BREAST SURGERY    . GLAUCOMA VALVE INSERTION Bilateral 2013, 2014   [facility] ([facility])  . TOTAL HIP ARTHROPLASTY  06/01/2011   Procedure: TOTAL HIP ARTHROPLASTY ANTERIOR APPROACH;  Surgeon: Mcarthur Rossetti, MD;  Location: [facility];  Service: Orthopedics;  Laterality: Left;  Left Total Hip Replacement, Direct Anterior Approach  . TOTAL KNEE ARTHROPLASTY Right 03/14/2015   Procedure: TOTAL KNEE ARTHROPLASTY;  Surgeon: Gaynelle Arabian, MD;  Location: [facility];  Service: Orthopedics;  Laterality: Right;   Social History   Occupational History  . Not on file  Tobacco Use  . Smoking status: Former Smoker    Quit date: 05/27/1980    Years since quitting: 38.4  . Smokeless tobacco: Never Used  Substance and Sexual Activity  . Alcohol use: No    Alcohol/week: 0.0 standard drinks  . Drug use: No  . Sexual activity: Not on file

## 2018-11-14 ENCOUNTER — Other Ambulatory Visit: Payer: Self-pay | Admitting: Internal Medicine

## 2018-11-14 DIAGNOSIS — N6009 Solitary cyst of unspecified breast: Secondary | ICD-10-CM

## 2018-11-14 DIAGNOSIS — N6001 Solitary cyst of right breast: Secondary | ICD-10-CM

## 2018-11-14 DIAGNOSIS — N6002 Solitary cyst of left breast: Secondary | ICD-10-CM

## 2018-11-19 ENCOUNTER — Other Ambulatory Visit: Payer: Medicare Other

## 2018-11-21 ENCOUNTER — Other Ambulatory Visit: Payer: Self-pay

## 2018-11-21 DIAGNOSIS — R600 Localized edema: Secondary | ICD-10-CM

## 2018-11-25 ENCOUNTER — Encounter (HOSPITAL_COMMUNITY): Payer: Medicare Other

## 2018-12-01 ENCOUNTER — Other Ambulatory Visit: Payer: Self-pay

## 2018-12-01 ENCOUNTER — Ambulatory Visit
Admission: RE | Admit: 2018-12-01 | Discharge: 2018-12-01 | Disposition: A | Discharge status: Home / Self Care | Payer: Medicare Other | Source: Ambulatory Visit | Attending: Internal Medicine | Admitting: Internal Medicine

## 2018-12-01 ENCOUNTER — Ambulatory Visit: Admission: RE | Admit: 2018-12-01 | Payer: Medicare Other | Source: Ambulatory Visit

## 2018-12-01 ENCOUNTER — Ambulatory Visit: Payer: Medicare Other

## 2018-12-01 DIAGNOSIS — R928 Other abnormal and inconclusive findings on diagnostic imaging of breast: Secondary | ICD-10-CM | POA: Diagnosis not present

## 2018-12-01 DIAGNOSIS — N6009 Solitary cyst of unspecified breast: Secondary | ICD-10-CM

## 2018-12-17 ENCOUNTER — Ambulatory Visit (HOSPITAL_COMMUNITY)
Admission: RE | Admit: 2018-12-17 | Discharge: 2018-12-17 | Disposition: A | Discharge status: Home / Self Care | Payer: Medicare Other | Source: Ambulatory Visit | Attending: Family | Admitting: Family

## 2018-12-17 ENCOUNTER — Other Ambulatory Visit: Payer: Self-pay

## 2018-12-17 ENCOUNTER — Ambulatory Visit (INDEPENDENT_AMBULATORY_CARE_PROVIDER_SITE_OTHER): Payer: Medicare Other | Admitting: Physician Assistant

## 2018-12-17 VITALS — BP 137/82 | HR 74 | Temp 97.7°F | Resp 14 | Ht 65.0 in | Wt 193.4 lb

## 2018-12-17 DIAGNOSIS — I872 Venous insufficiency (chronic) (peripheral): Secondary | ICD-10-CM

## 2018-12-17 DIAGNOSIS — R600 Localized edema: Secondary | ICD-10-CM | POA: Diagnosis not present

## 2018-12-17 NOTE — Progress Notes (Signed)
[facility] [place]   Reason for referral: Swollen Left > right leg  History of Present Illness  [name] is a 83 y.o. female who presents with chief complaint: swollen leg.  Patient notes, onset of swelling has progress since her left THA in 2013, associated with decreased activity and prolonged sitting.  The patient has had no history of DVT, no history of varicose vein, no history of venous stasis ulcers, no history of  Lymphedema and + history of skin changes in lower legs with erythema.  There is no family history of venous disorders.  The patient has  used compression stockings in the past.  She reports the left anterior dimple and scar was from skin biopsy.    Past medical history includes: Obesity, HTN,LBBB  Past Medical History:  Diagnosis Date  . Arthritis   . Bilateral edema of lower extremity   . Constipation   . Degenerative arthritis of hip    s/p THR 05/2011  . Depression   . Glaucoma   . Headache(784.0)   . HLD (hyperlipidemia)   . Hypertension   . OAB (overactive bladder)   . Osteoporosis   . Primary osteoarthritis of right knee   . Slow transit constipation   . Unsteady gait     Past Surgical History:  Procedure Laterality Date  .  cataracts removed  2013 L  . ABDOMINAL HYSTERECTOMY  2003  . BREAST BIOPSY  1960  . BREAST CYSTS     X2 BENIGN  . BREAST EXCISIONAL BIOPSY Left   . BREAST SURGERY    . GLAUCOMA VALVE INSERTION Bilateral 2013, 2014   bond ([facility])  . REDUCTION MAMMAPLASTY Bilateral 2000  . TOTAL HIP ARTHROPLASTY  06/01/2011   Procedure: TOTAL HIP ARTHROPLASTY ANTERIOR APPROACH;  Surgeon: Mcarthur Rossetti, MD;  Location: [facility];  Service: Orthopedics;  Laterality: Left;  Left Total Hip Replacement, Direct Anterior Approach  . TOTAL KNEE ARTHROPLASTY Right 03/14/2015   Procedure: TOTAL KNEE ARTHROPLASTY;  Surgeon: Gaynelle Arabian, MD;  Location: [facility];  Service: Orthopedics;  Laterality: Right;    Social History    Socioeconomic History  . Marital status: Divorced    Spouse name: Not on file  . Number of children: Not on file  . Years of education: Not on file  . Highest education level: Not on file  Occupational History  . Not on file  Social Needs  . Financial resource strain: Not on file  . Food insecurity    Worry: Not on file    Inability: Not on file  . Transportation needs    Medical: Not on file    Non-medical: Not on file  Tobacco Use  . Smoking status: Former Smoker    Quit date: 05/27/1980    Years since quitting: 38.5  . Smokeless tobacco: Never Used  Substance and Sexual Activity  . Alcohol use: No    Alcohol/week: 0.0 standard drinks  . Drug use: No  . Sexual activity: Not on file  Lifestyle  . Physical activity    Days per week: Not on file    Minutes per session: Not on file  . Stress: Not on file  Relationships  . Social Herbalist on phone: Not on file    Gets together: Not on file    Attends religious service: Not on file    Active member of club or organization: Not on file    Attends meetings of clubs or organizations:  Not on file    Relationship status: Not on file  . Intimate partner violence    Fear of current or ex partner: Not on file    Emotionally abused: Not on file    Physically abused: Not on file    Forced sexual activity: Not on file  Other Topics Concern  . Not on file  Social History Narrative   Lives alone in [place] at [place].  One child, [name].  Education: college.    Family History  Problem Relation Age of Onset  . Ovarian cancer Mother   . Alcohol abuse Father   . Stroke Brother     Current Outpatient Medications on File Prior to Visit  Medication Sig Dispense Refill  . buPROPion (WELLBUTRIN SR) 200 MG 12 hr tablet Take 200 mg by mouth 2 (two) times daily. Take 1 tablet in the morning and 1 tablet at night    . carvedilol (COREG) 25 MG tablet Take 25 mg by mouth 2 (two) times daily.    . cycloSPORINE (RESTASIS)  0.05 % ophthalmic emulsion Place 1 drop into both eyes every 12 hours.    . furosemide (LASIX) 40 MG tablet Take 40 mg by mouth daily.    [name] gabapentin (NEURONTIN) 300 MG capsule Take 1 capsule (300 mg total) by mouth at bedtime. 30 capsule 3  . lisinopril (PRINIVIL,ZESTRIL) 30 MG tablet take 1 tablet by mouth once daily 90 tablet 1  . timolol (TIMOPTIC) 0.5 % ophthalmic solution Place 1 drop into both eyes 2 times daily.    . traMADol (ULTRAM) 50 MG tablet Take 1-2 tablets (50-100 mg total) by mouth every 6 (six) hours as needed (mild pain). 80 tablet 1   No current facility-administered medications on file prior to visit.     Allergies as of 12/17/2018  . (No Known Allergies)     ROS:   General:  No weight loss, Fever, chills  HEENT: No recent headaches, no nasal bleeding, no visual changes, no sore throat  Neurologic: No dizziness, blackouts, seizures. No recent symptoms of stroke or mini- stroke. No recent episodes of slurred speech, or temporary blindness.  Cardiac: No recent episodes of chest pain/pressure, no shortness of breath at rest.  No shortness of breath with exertion.  Denies history of atrial fibrillation or irregular heartbeat  Vascular: No history of rest pain in feet.  No history of claudication.  No history of non-healing ulcer, No history of DVT   Pulmonary: No home oxygen, no productive cough, no hemoptysis,  No asthma or wheezing  Musculoskeletal:  [ ]  Arthritis, [ ]  Low back pain,  [ ]  Joint pain  Hematologic:No history of hypercoagulable state.  No history of easy bleeding.  No history of anemia  Gastrointestinal: No hematochezia or melena,  No gastroesophageal reflux, no trouble swallowing  Urinary: [ ]  chronic Kidney disease, [ ]  on HD - [ ]  MWF or [ ]  TTHS, [ ]  Burning with urination, [ ]  Frequent urination, [ ]  Difficulty urinating;   Skin: No rashes  Psychological: No history of anxiety,  No history of depression  Physical Examination  Vitals:    12/17/18 1458  BP: 137/82  Pulse: 74  Resp: 14  Temp: 97.7 F (36.5 C)  TempSrc: Temporal  SpO2: 95%  Weight: 193 lb 6.4 oz (87.7 kg)  Height: 5\' 5"  (1.651 m)    Body mass index is 32.18 kg/m.  General:  Alert and oriented, no acute distress HEENT: Normal Neck: No bruit or  JVD Pulmonary: Clear to auscultation bilaterally Cardiac: Regular Rate and Rhythm without murmur Abdomen: Soft, non-tender, non-distended, no mass, no scars Skin: No rash Extremity Pulses:  2+ radial, brachial, femoral, Doppler singles dorsalis pedis, posterior tibial bilaterally, edema prevented palpation Musculoskeletal: No deformity or edema  Neurologic: Upper and lower extremity motor 5/5 and symmetric  DATA: Study Result    Lower Venous Reflux Study  Indications: Swelling, Edema, and Pain.   Performing Technologist: Delorise Shiner RVT    Examination Guidelines: A complete evaluation includes B-mode imaging, spectral Doppler, color Doppler, and power Doppler as needed of all accessible portions of each vessel. Bilateral testing is considered an integral part of a complete examination. Limited examinations for reoccurring indications may be performed as noted. The reflux portion of the exam is performed with the patient in reverse Trendelenburg.     Right Technical Findings: No evidence of DVT, SVT, or Baker's cyst. The sapheno-femoral junction is competent. No evidence of GSV reflux. The SSV demonstrates reflux. The popliteal vein reveals deep venous reflux.    Left Technical Findings: No evidence of DVT, SVT, or Baker's cyst. The sapheno-femoral junction is incompetent. The GSV demonstrates reflux from the sapheno-femoral junction into the calf. The SSV demonstrates reflux. The common femoral vein demonstrates deep venous reflux.   Venous Reflux Times Normal value < 0.5 sec +--------------+------+---------+--------+--------+ RIGHT         RefluxReflux  NoDiameterComments                Yes                            +--------------+------+---------+--------+--------+ CFV                    no                     +--------------+------+---------+--------+--------+ FV prox                no                     +--------------+------+---------+--------+--------+ Popliteal      yes                            +--------------+------+---------+--------+--------+ GSV at [facility]             no     0.705           +--------------+------+---------+--------+--------+ GSV prox thigh         no     0.529           +--------------+------+---------+--------+--------+ GSV mid thigh          no     0.584           +--------------+------+---------+--------+--------+ GSV dist thigh         no     0.565           +--------------+------+---------+--------+--------+ GSV at knee            no     0.565           +--------------+------+---------+--------+--------+ GSV prox calf                 0.590           +--------------+------+---------+--------+--------+ GSV mid calf  0.559           +--------------+------+---------+--------+--------+ SSV Pop Fossa                 0.397           +--------------+------+---------+--------+--------+ SSV prox calf  yes            0.370  >500 ms  +--------------+------+---------+--------+--------+ SSV mid calf   yes            0.233  >500 ms  +--------------+------+---------+--------+--------+    +--------------+------+---------+--------+--------+ LEFT          RefluxReflux NoDiameterComments                Yes                            +--------------+------+---------+--------+--------+ CFV            yes                   1342 ms  +--------------+------+---------+--------+--------+ FV prox                no                     +--------------+------+---------+--------+--------+ Popliteal               no                     +--------------+------+---------+--------+--------+ GSV at SFJ     yes            0.699  >500 ms  +--------------+------+---------+--------+--------+ GSV prox thigh yes            0.596  >500 ms  +--------------+------+---------+--------+--------+ GSV mid thigh  yes            0.486  >500 ms  +--------------+------+---------+--------+--------+ GSV dist thigh yes            0.480  >500 ms  +--------------+------+---------+--------+--------+ GSV at knee    yes            0.839  >500 ms  +--------------+------+---------+--------+--------+ GSV prox calf                 0.596           +--------------+------+---------+--------+--------+ GSV mid calf                  0.571           +--------------+------+---------+--------+--------+ SSV Pop Fossa                 0.539           +--------------+------+---------+--------+--------+ SSV prox calf  yes             0.39  >500 ms  +--------------+------+---------+--------+--------+ SSV mid calf   yes            0.447  >500 ms  +--------------+------+---------+--------+--------+       Summary: Right: Evidence of chronic venous insufficiency is detected in the small saphenous vein, and deep venous system. There is no evidence of superficial venous thrombosis. There is no evidence of deep vein thrombosis in the lower extremity. No cystic  structure found in the popliteal fossa. Left: Evidence of chronic venous insufficiency is detected in the great saphenous vein, small saphenous vein, and deep venous system. There is no evidence of deep vein thrombosis in the lower extremity. There is no  evidence of superficial venous  thrombosis. No cystic structure found in the popliteal fossa.     Assessment: Venous reflux B LE L > R right The SSV demonstrates reflux. Left The sapheno-femoral junction is incompetent. The GSV demonstrates reflux from the  sapheno-femoral junction into the calf. The SSV demonstrates reflux. The common femoral vein demonstrates deep venous reflux.  Plan: Thigh compression 20-30 mmhg daily, elevation of B LE when at rest.  Increase activity as tolerates.  She will f/u in 3 months for consideration of laser ablation therapy.   Roxy Horseman PA-C [facility] [place] Office: [phone]  MD in clinic [place]

## 2019-02-06 ENCOUNTER — Other Ambulatory Visit: Payer: Self-pay | Admitting: Gastroenterology

## 2019-02-06 DIAGNOSIS — R109 Unspecified abdominal pain: Secondary | ICD-10-CM

## 2019-02-06 DIAGNOSIS — R110 Nausea: Secondary | ICD-10-CM

## 2019-02-11 ENCOUNTER — Ambulatory Visit
Admission: RE | Admit: 2019-02-11 | Discharge: 2019-02-11 | Disposition: A | Discharge status: Home / Self Care | Payer: Medicare Other | Source: Ambulatory Visit | Attending: Gastroenterology | Admitting: Gastroenterology

## 2019-02-11 ENCOUNTER — Other Ambulatory Visit: Payer: Self-pay

## 2019-02-11 DIAGNOSIS — R110 Nausea: Secondary | ICD-10-CM

## 2019-02-11 DIAGNOSIS — R109 Unspecified abdominal pain: Secondary | ICD-10-CM

## 2019-02-12 DIAGNOSIS — Z09 Encounter for follow-up examination after completed treatment for conditions other than malignant neoplasm: Secondary | ICD-10-CM | POA: Diagnosis not present

## 2019-02-18 ENCOUNTER — Other Ambulatory Visit: Payer: Medicare Other

## 2019-02-19 DIAGNOSIS — Z23 Encounter for immunization: Secondary | ICD-10-CM | POA: Diagnosis not present

## 2019-03-16 ENCOUNTER — Ambulatory Visit: Payer: Medicare Other | Admitting: Interventional Cardiology

## 2019-03-19 ENCOUNTER — Other Ambulatory Visit: Payer: Self-pay | Admitting: Gastroenterology

## 2019-03-19 DIAGNOSIS — R1310 Dysphagia, unspecified: Secondary | ICD-10-CM

## 2019-03-19 DIAGNOSIS — Z23 Encounter for immunization: Secondary | ICD-10-CM | POA: Diagnosis not present

## 2019-03-19 DIAGNOSIS — K529 Noninfective gastroenteritis and colitis, unspecified: Secondary | ICD-10-CM | POA: Diagnosis not present

## 2019-03-26 ENCOUNTER — Ambulatory Visit: Payer: Medicare Other | Admitting: Vascular Surgery

## 2019-03-26 DIAGNOSIS — H401123 Primary open-angle glaucoma, left eye, severe stage: Secondary | ICD-10-CM | POA: Diagnosis not present

## 2019-03-26 DIAGNOSIS — H401112 Primary open-angle glaucoma, right eye, moderate stage: Secondary | ICD-10-CM | POA: Diagnosis not present

## 2019-03-27 ENCOUNTER — Other Ambulatory Visit: Payer: Medicare Other

## 2019-04-01 ENCOUNTER — Telehealth (HOSPITAL_COMMUNITY): Payer: Self-pay

## 2019-04-01 NOTE — Telephone Encounter (Signed)

## 2019-04-02 ENCOUNTER — Ambulatory Visit (INDEPENDENT_AMBULATORY_CARE_PROVIDER_SITE_OTHER): Payer: Medicare Other | Admitting: Vascular Surgery

## 2019-04-02 ENCOUNTER — Encounter: Payer: Self-pay | Admitting: Vascular Surgery

## 2019-04-02 ENCOUNTER — Other Ambulatory Visit: Payer: Self-pay

## 2019-04-02 VITALS — BP 135/83 | HR 81 | Temp 97.0°F | Resp 16 | Ht 62.0 in | Wt 191.7 lb

## 2019-04-02 DIAGNOSIS — I872 Venous insufficiency (chronic) (peripheral): Secondary | ICD-10-CM

## 2019-04-02 NOTE — Progress Notes (Signed)
Patient name: [name] MRN: [id number] DOB: [birth year] Sex: female  REASON FOR VISIT:   Follow-up of chronic venous insufficiency.  HPI:   [name] is a pleasant 84 y.o. female who was seen by Laurence Slate, PA on 12/17/2018.  The patient had been referred with bilateral leg swelling which was more significant on the left side.  The patient had no previous history of DVT.  The patient had used compression stockings in the past.  It was recommended that she begin wearing thigh-high compression stockings with a gradient of 20 to 30 mmHg, elevating her legs daily and taking ibuprofen as needed for pain.  This did not help her symptoms it was felt that she might be a candidate for laser ablation of the left great saphenous vein.  On my history, the patient's main complaint and main concern is left leg swelling which she has had for over 10 years.  This is mostly in the left leg.  Of note many years ago she had a biopsy on that side and has some discoloration of the skin around this area.  She denies any previous history of DVT.  She has had no previous vein procedures.  She does wear some knee-high compression stockings occasionally which help her symptoms some.  She occasionally elevates her legs which help the swelling some.  She denies any history of claudication although I think her activity is fairly limited.  She denies any history of rest pain or nonhealing ulcers.  Her risk factors for peripheral vascular disease include hypertension, hyperlipidemia, and a remote history of tobacco use.  She denies any history of diabetes or any family history of premature cardiovascular disease.  Past Medical History:  Diagnosis Date  . Arthritis   . Bilateral edema of lower extremity   . Constipation   . Degenerative arthritis of hip    s/p THR 05/2011  . Depression   . Glaucoma   . Headache(784.0)   . HLD (hyperlipidemia)   . Hypertension   . OAB (overactive bladder)   . Osteoporosis   .  Primary osteoarthritis of right knee   . Slow transit constipation   . Unsteady gait     Family History  Problem Relation Age of Onset  . Ovarian cancer Mother   . Alcohol abuse Father   . Stroke Brother     SOCIAL HISTORY: Social History   Tobacco Use  . Smoking status: Former Smoker    Quit date: 05/27/1980    Years since quitting: 38.8  . Smokeless tobacco: Never Used  Substance Use Topics  . Alcohol use: No    Alcohol/week: 0.0 standard drinks    No Known Allergies  Current Outpatient Medications  Medication Sig Dispense Refill  . carvedilol (COREG) 25 MG tablet Take 25 mg by mouth 2 (two) times daily.    . cycloSPORINE (RESTASIS) 0.05 % ophthalmic emulsion Place 1 drop into both eyes every 12 hours.    . furosemide (LASIX) 40 MG tablet Take 40 mg by mouth daily.    [name] lisinopril (PRINIVIL,ZESTRIL) 30 MG tablet take 1 tablet by mouth once daily 90 tablet 1  . timolol (TIMOPTIC) 0.5 % ophthalmic solution Place 1 drop into both eyes 2 times daily.    [name] buPROPion (WELLBUTRIN SR) 200 MG 12 hr tablet Take 200 mg by mouth 2 (two) times daily. Take 1 tablet in the morning and 1 tablet at night    . gabapentin (NEURONTIN) 300 MG capsule Take 1 capsule (  300 mg total) by mouth at bedtime. (Patient not taking: Reported on 04/02/2019) 30 capsule 3  . traMADol (ULTRAM) 50 MG tablet Take 1-2 tablets (50-100 mg total) by mouth every 6 (six) hours as needed (mild pain). (Patient not taking: Reported on 04/02/2019) 80 tablet 1   No current facility-administered medications for this visit.    REVIEW OF SYSTEMS:  [X]  denotes positive finding, [ ]  denotes negative finding Cardiac  Comments:  Chest pain or chest pressure:    Shortness of breath upon exertion:    Short of breath when lying flat:    Irregular heart rhythm:        Vascular    Pain in calf, thigh, or hip brought on by ambulation:    Pain in feet at night that wakes you up from your sleep:     Blood clot in your veins:      Leg swelling:         Pulmonary    Oxygen at home:    Productive cough:     Wheezing:         Neurologic    Sudden weakness in arms or legs:     Sudden numbness in arms or legs:     Sudden onset of difficulty speaking or slurred speech:    Temporary loss of vision in one eye:     Problems with dizziness:         Gastrointestinal    Blood in stool:     Vomited blood:         Genitourinary    Burning when urinating:     Blood in urine:        Psychiatric    Major depression:         Hematologic    Bleeding problems:    Problems with blood clotting too easily:        Skin    Rashes or ulcers:        Constitutional    Fever or chills:     PHYSICAL EXAM:   Vitals:   04/02/19 1419  BP: 135/83  Pulse: 81  Resp: 16  Temp: (!) 97 F (36.1 C)  TempSrc: Temporal  SpO2: 97%  Weight: 191 lb 11.2 oz (87 kg)  Height: 5\' 2"  (1.575 m)    GENERAL: The patient is a well-nourished female, in no acute distress. The vital signs are documented above. CARDIAC: There is a regular rate and rhythm.  VASCULAR: I do not detect carotid bruits. I could not palpate pedal pulses however I was able to obtain monophasic Doppler signals in the dorsalis pedis and posterior tibial positions bilaterally. She has some spider veins and telangiectasias in the right leg.  On the left side she has significant leg swelling with hyperpigmentation.   I looked at her left great saphenous vein myself with the SonoSite.  She does have reflux from the saphenofemoral junction to the knee.  The vein is dilated. PULMONARY: There is good air exchange bilaterally without wheezing or rales. ABDOMEN: Soft and non-tender with normal pitched bowel sounds.  MUSCULOSKELETAL: There are no major deformities or cyanosis. NEUROLOGIC: No focal weakness or paresthesias are detected. SKIN: There are no ulcers or rashes noted. PSYCHIATRIC: The patient has a normal affect.  DATA:    VENOUS DUPLEX: I have independently  interpreted her venous duplex scan today.  Calf on the right side there is no evidence of DVT or superficial venous thrombosis.  There is deep venous reflux  involving the popliteal vein.  Her only superficial venous reflux on the right is in the calf.  On the left side there is no evidence of DVT or superficial venous thrombosis.  There is deep venous reflux involving the common femoral vein.  There is superficial venous reflux in the left great saphenous vein.  Saphenous vein is incompetent from the saphenofemoral junction to the calf.  The vein is dilated with diameters up to 0.6 cm in the thigh.  MEDICAL ISSUES:   CHRONIC VENOUS INSUFFICIENCY: This patient has CEAP C4 venous disease.  We have discussed the importance of intermittent leg elevation and the proper positioning for this.  In addition I have encouraged her to continue to wear her compression stockings.  I have encouraged her to avoid prolonged sitting and standing.  We discussed the importance of exercise specifically walking and water aerobics.  In addition we discussed the importance of maintaining a healthy weight as central obesity especially increases lower extremity venous pressure.  If her swelling persists or worsen then I think she would be a candidate for laser ablation of the left great saphenous vein.  She will call if her symptoms do not improve.  Deitra Mayo [facility] [phone]

## 2019-04-06 ENCOUNTER — Ambulatory Visit
Admission: RE | Admit: 2019-04-06 | Discharge: 2019-04-06 | Disposition: A | Discharge status: Home / Self Care | Payer: Medicare Other | Source: Ambulatory Visit | Attending: Gastroenterology | Admitting: Gastroenterology

## 2019-04-06 DIAGNOSIS — K219 Gastro-esophageal reflux disease without esophagitis: Secondary | ICD-10-CM | POA: Diagnosis not present

## 2019-04-06 DIAGNOSIS — R1310 Dysphagia, unspecified: Secondary | ICD-10-CM

## 2019-04-06 DIAGNOSIS — K224 Dyskinesia of esophagus: Secondary | ICD-10-CM | POA: Diagnosis not present

## 2019-04-14 ENCOUNTER — Ambulatory Visit (INDEPENDENT_AMBULATORY_CARE_PROVIDER_SITE_OTHER): Payer: Medicare Other | Admitting: Critical Care Medicine

## 2019-04-14 ENCOUNTER — Encounter: Payer: Self-pay | Admitting: Critical Care Medicine

## 2019-04-14 ENCOUNTER — Other Ambulatory Visit: Payer: Self-pay

## 2019-04-14 VITALS — BP 98/50 | HR 70 | Temp 97.3°F | Ht 63.0 in | Wt 191.6 lb

## 2019-04-14 DIAGNOSIS — R0609 Other forms of dyspnea: Secondary | ICD-10-CM

## 2019-04-14 DIAGNOSIS — R5381 Other malaise: Secondary | ICD-10-CM | POA: Diagnosis not present

## 2019-04-14 DIAGNOSIS — R0600 Dyspnea, unspecified: Secondary | ICD-10-CM | POA: Diagnosis not present

## 2019-04-14 NOTE — Patient Instructions (Addendum)
Thank you for visiting Dr. Carlis Abbott at [facility]. We recommend the following: Orders Placed This Encounter  Procedures  . Pulmonary function test   Orders Placed This Encounter  Procedures  . Pulmonary function test    Standing Status:   Future    Standing Expiration Date:   04/13/2020    Order Specific Question:   Where should this test be performed?    Answer:   [facility]      Return in about 2 months (around 06/14/2019). after PFTs    Please do your part to reduce the spread of COVID-19.

## 2019-04-14 NOTE — Progress Notes (Signed)
Synopsis: Referred in March 2021 for shortness of breath by Shannon Battles, MD.  Subjective:   PATIENT ID: [name] GENDER: female DOB: [birth year], MRN: [id number]  Chief Complaint  Patient presents with  . Consult    Patient is here for COPD. Patient has shortness of breath with exertion and a dry cough.     [name] is an 84 y/o woman referred for evaluation of chronic DOE. She was diagnosed with COPD about 8 months ago but has never had PFTs.  Her predominant symptom is dyspnea on exertion.  She has trouble walking the halls at her senior living community-athetoid.  She was prescribed Anoro inhaler to use once daily, without improvement in her symptoms.  She tried it for about a week, but is concerned about the potential of worsening glaucoma.  She has an occasional cough, but no significant wheezing or shortness of breath at rest.  No sputum production.  She has no history of allergies or asthma.  Her father had emphysema.  She is a former smoker-25 years x 1 pack/day before quitting in 1980.  She attributes much of her shortness of breath to 20 pound weight gain in the last few years.  She had no occupational pulmonary exposures.  She has no history of VTE.  She has chronic HFpEF and venous stasis of her left lower extremity following hip surgery in her left leg several years ago.  She has not been wearing her TED hose.  She is up-to-date on both Covid vaccines per her report.     Past Medical History:  Diagnosis Date  . Arthritis   . Bilateral edema of lower extremity   . Constipation   . Degenerative arthritis of hip    s/p THR 05/2011  . Depression   . Glaucoma   . Headache(784.0)   . HLD (hyperlipidemia)   . Hypertension   . OAB (overactive bladder)   . Osteoporosis   . Primary osteoarthritis of right knee   . Slow transit constipation   . Unsteady gait   . Venous insufficiency      Family History  Problem Relation Age of Onset  . Ovarian cancer Mother   .  Alcohol abuse Father   . Emphysema Father   . Stroke Brother      Past Surgical History:  Procedure Laterality Date  .  cataracts removed  2013 L  . ABDOMINAL HYSTERECTOMY  2003  . BREAST BIOPSY  1960  . BREAST CYSTS     X2 BENIGN  . BREAST EXCISIONAL BIOPSY Left   . BREAST SURGERY    . GLAUCOMA VALVE INSERTION Bilateral 2013, 2014   bond ([facility])  . REDUCTION MAMMAPLASTY Bilateral 2000  . TOTAL HIP ARTHROPLASTY  06/01/2011   Procedure: TOTAL HIP ARTHROPLASTY ANTERIOR APPROACH;  Surgeon: Mcarthur Rossetti, MD;  Location: [facility];  Service: Orthopedics;  Laterality: Left;  Left Total Hip Replacement, Direct Anterior Approach  . TOTAL KNEE ARTHROPLASTY Right 03/14/2015   Procedure: TOTAL KNEE ARTHROPLASTY;  Surgeon: Gaynelle Arabian, MD;  Location: [facility];  Service: Orthopedics;  Laterality: Right;    Social History   Socioeconomic History  . Marital status: Divorced    Spouse name: Not on file  . Number of children: Not on file  . Years of education: Not on file  . Highest education level: Not on file  Occupational History  . Not on file  Tobacco Use  . Smoking status: Former Smoker    Packs/day: 1.00  Years: 35.00    Pack years: 35.00    Types: Cigarettes    Quit date: 05/27/1980    Years since quitting: 38.9  . Smokeless tobacco: Never Used  Substance and Sexual Activity  . Alcohol use: No    Alcohol/week: 0.0 standard drinks  . Drug use: No  . Sexual activity: Not on file  Other Topics Concern  . Not on file  Social History Narrative   Lives alone in [place] at [place].  One child, [name].  Education: college.   Social Determinants of Health   Financial Resource Strain:   . Difficulty of Paying Living Expenses:   Food Insecurity:   . Worried About Charity fundraiser in the Last Year:   . Arboriculturist in the Last Year:   Transportation Needs:   . Film/video editor (Medical):   [name] Lack of Transportation (Non-Medical):   Physical Activity:   . Days of  Exercise per Week:   . Minutes of Exercise per Session:   Stress:   . Feeling of Stress :   Social Connections:   . Frequency of Communication with Friends and Family:   . Frequency of Social Gatherings with Friends and Family:   . Attends Religious Services:   . Active Member of Clubs or Organizations:   . Attends Archivist Meetings:   [name] Marital Status:   Intimate Partner Violence:   . Fear of Current or Ex-Partner:   . Emotionally Abused:   [name] Physically Abused:   . Sexually Abused:      No Known Allergies   Immunization History  Administered Date(s) Administered  . Influenza Split 10/30/2011  . Influenza, High Dose Seasonal PF 11/11/2012  . PPD Test 03/17/2015, 03/17/2015  . Pneumococcal Polysaccharide-23 01/29/2009  . Td 01/29/2006    Outpatient Medications Prior to Visit  Medication Sig Dispense Refill  . buPROPion (WELLBUTRIN SR) 200 MG 12 hr tablet Take 200 mg by mouth 2 (two) times daily. Take 1 tablet in the morning and 1 tablet at night    . carvedilol (COREG) 25 MG tablet Take 25 mg by mouth 2 (two) times daily.    . cycloSPORINE (RESTASIS) 0.05 % ophthalmic emulsion Place 1 drop into both eyes every 12 hours.    . furosemide (LASIX) 40 MG tablet Take 40 mg by mouth daily.    [name] gabapentin (NEURONTIN) 300 MG capsule Take 1 capsule (300 mg total) by mouth at bedtime. 30 capsule 3  . lisinopril (PRINIVIL,ZESTRIL) 30 MG tablet take 1 tablet by mouth once daily 90 tablet 1  . timolol (TIMOPTIC) 0.5 % ophthalmic solution Place 1 drop into both eyes 2 times daily.    . traMADol (ULTRAM) 50 MG tablet Take 1-2 tablets (50-100 mg total) by mouth every 6 (six) hours as needed (mild pain). 80 tablet 1   No facility-administered medications prior to visit.    Review of Systems  Constitutional: Negative.   HENT: Negative for congestion.   Respiratory: Positive for shortness of breath and wheezing. Negative for cough.   Cardiovascular: Positive for leg swelling.  Negative for chest pain.  Gastrointestinal: Positive for diarrhea.  Endo/Heme/Allergies: Negative for environmental allergies.     Objective:   Vitals:   04/14/19 1439  BP: (!) 98/50  Pulse: 70  Temp: (!) 97.3 F (36.3 C)  TempSrc: Temporal  SpO2: 96%  Weight: 191 lb 9.6 oz (86.9 kg)  Height: 5\' 3"  (1.6 m)   96% on  RA BMI Readings from Last 3 Encounters:  04/14/19 33.94 kg/m  04/02/19 35.06 kg/m  12/17/18 32.18 kg/m   Wt Readings from Last 3 Encounters:  04/14/19 191 lb 9.6 oz (86.9 kg)  04/02/19 191 lb 11.2 oz (87 kg)  12/17/18 193 lb 6.4 oz (87.7 kg)    Physical Exam Vitals reviewed.  Constitutional:      Appearance: She is obese. She is not ill-appearing.  HENT:     Head: Normocephalic and atraumatic.  Eyes:     General: No scleral icterus. Cardiovascular:     Rate and Rhythm: Normal rate and regular rhythm.  Pulmonary:     Comments: Breathing comfortably on room air, no conversational dyspnea.  Clear to auscultation bilaterally.  Decreased basilar breath sounds. Abdominal:     General: There is no distension.     Palpations: Abdomen is soft.     Tenderness: There is no abdominal tenderness.  Musculoskeletal:     Cervical back: Neck supple.     Comments: LLE edema > RLE  Skin:    General: Skin is warm and dry.     Findings: No rash.  Neurological:     General: No focal deficit present.     Mental Status: She is alert.     Motor: No weakness.     Coordination: Coordination normal.  Psychiatric:        Mood and Affect: Mood normal.        Behavior: Behavior normal.      CBC    Component Value Date/Time   WBC 9.1 11/02/2016 1341   RBC 4.04 11/02/2016 1341   HGB 13.6 11/02/2016 1341   HCT 38.2 11/02/2016 1341   PLT 205 11/02/2016 1341   MCV 94.6 11/02/2016 1341   MCH 33.7 11/02/2016 1341   MCHC 35.6 11/02/2016 1341   RDW 12.5 11/02/2016 1341   LYMPHSABS 1.3 05/08/2012 1055   MONOABS 0.5 05/08/2012 1055   EOSABS 0.1 05/08/2012 1055    BASOSABS 0.0 05/08/2012 1055    CHEMISTRY No results for input(s): NA, K, CL, CO2, GLUCOSE, BUN, CREATININE, CALCIUM, MG, PHOS in the last 168 hours. CrCl cannot be calculated (Patient's most recent lab result is older than the maximum 21 days allowed.).   Chest Imaging- films reviewed: CT abdomen pelvis 02/11/2019, lung images reviewed- airway thickening  CXR, 2 view 11/02/16-kyphosis, tortuous retrocardiac aorta.   Esophogram 04/06/2019:  IMPRESSION: 1. Normal oral and pharyngeal phase of swallowing, with no laryngeal penetration or tracheobronchial aspiration. 2. Small sliding hiatal hernia. Minimal gastroesophageal reflux. 3. Mild esophageal dysmotility. 4. Moderate cricopharyngeus muscle dysfunction, characteristic of chronic gastroesophageal reflux disease. 5. No evidence of reflux esophagitis. No evidence of soft tissue mass or stricture.  Pulmonary Functions Testing Results: No flowsheet data found.    Echocardiogram 0000000: Normal systolic function EF 123456.  There is diastolic dysfunction is present.     Assessment & Plan:     ICD-10-CM   1. DOE (dyspnea on exertion)  R06.00 Pulmonary function test  2. Morbid (severe) obesity due to excess calories (HCC)  E66.01   3. Physical deconditioning  R53.81     DOE- possibly COPD given her smoking history. At risk for restrictive lung disease due to kyphosis and obesity. HFpEF potentially contributing. She has a previous history of anemia, but last available CBC >2 years ago. -Agree with her concern about Anoro worsening glaucoma.  She has follow-up with her ophthalmologist next month.  She was started on a new medication for  her eyes last month due to concerning eye pressures.  I have asked her to take her inhaler with her to her next visit and discuss with her ophthalmologist. -PFTs -Okay to avoid using Anoro for now. -Recommend regular physical activity as tolerated to increase her exercise tolerance -Recommend weight  loss as a long-term goal -Consider follow-up CBC at next visit  Physical deconditioning likely due to dyspnea on exertion and previous orthopedic surgeries. -Recommend regular physical activity with gradually increasing distance walks to improve her exercise tolerance. -Recommended TED hose, which she has previously been's prescribed to help with her leg edema.  RTC in 2 months with PFTs.   Current Outpatient Medications:  .  buPROPion (WELLBUTRIN SR) 200 MG 12 hr tablet, Take 200 mg by mouth 2 (two) times daily. Take 1 tablet in the morning and 1 tablet at night, Disp: , Rfl:  .  carvedilol (COREG) 25 MG tablet, Take 25 mg by mouth 2 (two) times daily., Disp: , Rfl:  .  cycloSPORINE (RESTASIS) 0.05 % ophthalmic emulsion, Place 1 drop into both eyes every 12 hours., Disp: , Rfl:  .  furosemide (LASIX) 40 MG tablet, Take 40 mg by mouth daily., Disp: , Rfl:  .  gabapentin (NEURONTIN) 300 MG capsule, Take 1 capsule (300 mg total) by mouth at bedtime., Disp: 30 capsule, Rfl: 3 .  lisinopril (PRINIVIL,ZESTRIL) 30 MG tablet, take 1 tablet by mouth once daily, Disp: 90 tablet, Rfl: 1 .  timolol (TIMOPTIC) 0.5 % ophthalmic solution, Place 1 drop into both eyes 2 times daily., Disp: , Rfl:  .  traMADol (ULTRAM) 50 MG tablet, Take 1-2 tablets (50-100 mg total) by mouth every 6 (six) hours as needed (mild pain)., Disp: 80 tablet, Rfl: 1    Julian Hy, DO [facility] 04/14/2019 4:03 PM

## 2019-04-15 ENCOUNTER — Encounter: Payer: Self-pay | Admitting: Critical Care Medicine

## 2019-04-16 NOTE — Progress Notes (Signed)
Cardiology Office Note:    Date:  04/17/2019   ID:  [name], DOB [birth year], MRN [id number]  PCP:  Shannon Battles, MD  Cardiologist:  No primary care provider on file.   Referring MD: Shannon Battles, MD   Chief Complaint  Patient presents with  . Advice Only    Lower extremity swelling and left bundle branch block    History of Present Illness:    [name] is a 84 y.o. female with a hx of LBBB, hypertension hyperlipidemia, CKD 3, and LLE edema.  Previously seen by Dr. Darlin Jordan.  She has a prior history of smoking, lower extremity venous insufficiency involving the left leg greater than the right, chronic left lower extremity swelling, left bundle branch block with prior negative cardiac work-up including echocardiogram and nuclear stress testing (remote).  She has no particular symptoms.  The left lower extremity swelling has been evaluated by vein and vascular clinic on give her college [employer].  She has venous varicosity that is causing lower extremity swelling.  They wanted to do laser surgery but it was never done.  They recommended compression stockings but she stopped wearing them.  Past Medical History:  Diagnosis Date  . Arthritis   . Bilateral edema of lower extremity   . CKD (chronic kidney disease)   . Constipation   . Degenerative arthritis of hip    s/p THR 05/2011  . Depression   . Glaucoma   . Headache(784.0)   . HLD (hyperlipidemia)   . Hypertension   . OAB (overactive bladder)   . Osteoporosis   . Primary osteoarthritis of right knee   . Slow transit constipation   . Unsteady gait   . Venous insufficiency     Past Surgical History:  Procedure Laterality Date  .  cataracts removed  2013 L  . ABDOMINAL HYSTERECTOMY  2003  . BREAST BIOPSY  1960  . BREAST CYSTS     X2 BENIGN  . BREAST EXCISIONAL BIOPSY Left   . BREAST SURGERY    . GLAUCOMA VALVE INSERTION Bilateral 2013, 2014   bond ([facility])  . REDUCTION MAMMAPLASTY Bilateral 2000  .  TOTAL HIP ARTHROPLASTY  06/01/2011   Procedure: TOTAL HIP ARTHROPLASTY ANTERIOR APPROACH;  Surgeon: Mcarthur Rossetti, MD;  Location: [facility];  Service: Orthopedics;  Laterality: Left;  Left Total Hip Replacement, Direct Anterior Approach  . TOTAL KNEE ARTHROPLASTY Right 03/14/2015   Procedure: TOTAL KNEE ARTHROPLASTY;  Surgeon: Gaynelle Arabian, MD;  Location: [facility];  Service: Orthopedics;  Laterality: Right;    Current Medications: Current Meds  Medication Sig  . brimonidine (ALPHAGAN) 0.2 % ophthalmic solution Place 1 drop into the left eye 2 times daily.  . carvedilol (COREG) 25 MG tablet Take 12.5 mg by mouth 2 (two) times daily. TAKE HALF A TABLET (12.5 MG) BY MOUTH TWICE DAILY.  . cycloSPORINE (RESTASIS) 0.05 % ophthalmic emulsion Place 1 drop into both eyes every 12 hours.  . furosemide (LASIX) 40 MG tablet Take 20 mg by mouth daily.   [name] lisinopril (PRINIVIL,ZESTRIL) 30 MG tablet take 1 tablet by mouth once daily  . timolol (TIMOPTIC) 0.5 % ophthalmic solution Place 1 drop into both eyes 2 times daily.     Allergies:   Patient has no known allergies.   Social History   Socioeconomic History  . Marital status: Divorced    Spouse name: Not on file  . Number of children: Not on file  . Years of education: Not on file  .  Highest education level: Not on file  Occupational History  . Not on file  Tobacco Use  . Smoking status: Former Smoker    Packs/day: 1.00    Years: 35.00    Pack years: 35.00    Types: Cigarettes    Quit date: 05/27/1980    Years since quitting: 38.9  . Smokeless tobacco: Never Used  Substance and Sexual Activity  . Alcohol use: No    Alcohol/week: 0.0 standard drinks  . Drug use: No  . Sexual activity: Not on file  Other Topics Concern  . Not on file  Social History Narrative   Lives alone in [place] at [place].  One child, [name].  Education: college.   Social Determinants of Health   Financial Resource Strain:   . Difficulty of Paying Living  Expenses:   Food Insecurity:   . Worried About Charity fundraiser in the Last Year:   . Arboriculturist in the Last Year:   Transportation Needs:   . Film/video editor (Medical):   [name] Lack of Transportation (Non-Medical):   Physical Activity:   . Days of Exercise per Week:   . Minutes of Exercise per Session:   Stress:   . Feeling of Stress :   Social Connections:   . Frequency of Communication with Friends and Family:   . Frequency of Social Gatherings with Friends and Family:   . Attends Religious Services:   . Active Member of Clubs or Organizations:   . Attends Archivist Meetings:   [name] Marital Status:      Family History: The patient's family history includes Alcohol abuse in her father; Emphysema in her father; Ovarian cancer in her mother; Stroke in her brother.  ROS:   Please see the history of present illness.    She has musculoskeletal pains.  Having left hip discomfort although improving.  She has had right knee total replacement.  Has hiatal hernia.  She has been instructed in the past to use compression stockings on her left lower extremity all other systems reviewed and are negative.  EKGs/Labs/Other Studies Reviewed:    The following studies were reviewed today: 2D Doppler echocardiogram performed July 2015: Study Conclusions   - Left ventricle: The cavity size was normal. Wall thickness was  normal. Systolic function was normal. The estimated ejection  fraction was in the range of 55% to 60%. Wall motion was normal;  there were no regional wall motion abnormalities. There was an  increased relative contribution of atrial contraction to  ventricular filling. Doppler parameters are consistent with  abnormal left ventricular relaxation (grade 1 diastolic  dysfunction).  - Aortic valve: There was trivial regurgitation.  - Mitral valve: There was mild regurgitation.  - Pulmonary arteries: Systolic pressure was mildly increased. PA   peak pressure: 38 mm Hg (S).    EKG:  EKG performed November 03, 2016 and June 2015 reveal left bundle branch block with first-degree AV block.  The current EKG normal sinus rhythm, first-degree AV block with PR interval 214 ms, and when compared to prior tracings, no changes noted.  Recent Labs: No results found for requested labs within last 8760 hours.  Recent Lipid Panel    Component Value Date/Time   CHOL 163 07/10/2013 1229   TRIG 87.0 07/10/2013 1229   HDL 56.70 07/10/2013 1229   CHOLHDL 3 07/10/2013 1229   VLDL 17.4 07/10/2013 1229   LDLCALC 89 07/10/2013 1229    Physical Exam:  VS:  BP (!) 150/78   Pulse 66   Ht 5\' 3"  (1.6 m)   Wt 192 lb (87.1 kg)   SpO2 93%   BMI 34.01 kg/m     Wt Readings from Last 3 Encounters:  04/17/19 192 lb (87.1 kg)  04/14/19 191 lb 9.6 oz (86.9 kg)  04/02/19 191 lb 11.2 oz (87 kg)     GEN: Elderly but healthy-appearing. No acute distress HEENT: Normal NECK: No JVD. LYMPHATICS: No lymphadenopathy CARDIAC:  RRR without murmur, gallop, or edema. VASCULAR:  Normal Pulses. No bruits. RESPIRATORY:  Clear to auscultation without rales, wheezing or rhonchi  ABDOMEN: Soft, non-tender, non-distended, No pulsatile mass, MUSCULOSKELETAL: No deformity  SKIN: Warm and dry NEUROLOGIC:  Alert and oriented x 3 PSYCHIATRIC:  Normal affect   ASSESSMENT:    1. Lower extremity edema   2. LBBB (left bundle branch block)   3. Essential hypertension   4. Diastolic dysfunction   5. Educated about COVID-19 virus infection    PLAN:    In order of problems listed above:  1. Left lower extremity swelling, secondary to venous insufficiency with lichenification of skin.  No lesions are noted.  Compression stocking and elevation is recommended as the treatment of choice.  Might consider returning to a vein specialist to see if anything further is needed. 2. Chronic and unchanged. 3. Blood pressure was reevaluated and is 142/74 on reassessment.  No  specific action is required.  Continue furosemide and Zestril.  She is also on carvedilol 12.5 mg twice daily. 4. No evidence of systolic dysfunction.  She is on good guideline directed therapy for LV preservation.  No specific evaluation is needed. 5. COVID-19 vaccine has been received.  She is practicing social distancing.  I do not believe an active cardiac problem is present.  Left bundle branch block is chronic.  Left lower extremity swelling is chronic.  She knows that it is venous insufficiency.  She wanted to be sure that nothing new was going on.   As needed follow-up if cardiology problems arise.   Medication Adjustments/Labs and Tests Ordered: Current medicines are reviewed at length with the patient today.  Concerns regarding medicines are outlined above.  Orders Placed This Encounter  Procedures  . EKG 12-Lead   No orders of the defined types were placed in this encounter.   Patient Instructions  Medication Instructions:  Your physician recommends that you continue on your current medications as directed. Please refer to the Current Medication list given to you today.  *If you need a refill on your cardiac medications before your next appointment, please call your pharmacy*   Lab Work: None If you have labs (blood work) drawn today and your tests are completely normal, you will receive your results only by: [name] MyChart Message (if you have MyChart) OR . A paper copy in the mail If you have any lab test that is abnormal or we need to change your treatment, we will call you to review the results.   Testing/Procedures: None   Follow-Up: At [facility], you and your health needs are our priority.  As part of our continuing mission to provide you with exceptional heart care, we have created designated Provider Care Teams.  These Care Teams include your primary Cardiologist (physician) and Advanced Practice Providers (APPs -  Physician Assistants and Nurse  Practitioners) who all work together to provide you with the care you need, when you need it.  We recommend signing up for the  patient portal called "MyChart".  Sign up information is provided on this After Visit Summary.  MyChart is used to connect with patients for Virtual Visits (Telemedicine).  Patients are able to view lab/test results, encounter notes, upcoming appointments, etc.  Non-urgent messages can be sent to your provider as well.   To learn more about what you can do with MyChart, go to [email or web address].    Your next appointment:   As needed  The format for your next appointment:   In Person  Provider:   You may see Dr. Daneen Schick or one of the following Advanced Practice Providers on your designated Care Team:    Truitt Merle, NP  Cecilie Kicks, NP  Kathyrn Drown, NP    Other Instructions      Signed, Sinclair Grooms, MD  04/17/2019 2:44 PM    [place]

## 2019-04-17 ENCOUNTER — Ambulatory Visit (INDEPENDENT_AMBULATORY_CARE_PROVIDER_SITE_OTHER): Payer: Medicare Other | Admitting: Interventional Cardiology

## 2019-04-17 ENCOUNTER — Other Ambulatory Visit: Payer: Self-pay

## 2019-04-17 ENCOUNTER — Encounter: Payer: Self-pay | Admitting: Interventional Cardiology

## 2019-04-17 VITALS — BP 150/78 | HR 66 | Ht 63.0 in | Wt 192.0 lb

## 2019-04-17 DIAGNOSIS — I5189 Other ill-defined heart diseases: Secondary | ICD-10-CM

## 2019-04-17 DIAGNOSIS — Z7189 Other specified counseling: Secondary | ICD-10-CM

## 2019-04-17 DIAGNOSIS — I447 Left bundle-branch block, unspecified: Secondary | ICD-10-CM

## 2019-04-17 DIAGNOSIS — R600 Localized edema: Secondary | ICD-10-CM

## 2019-04-17 DIAGNOSIS — I10 Essential (primary) hypertension: Secondary | ICD-10-CM | POA: Diagnosis not present

## 2019-04-17 NOTE — Patient Instructions (Signed)

## 2019-04-27 ENCOUNTER — Encounter: Payer: Self-pay | Admitting: Vascular Surgery

## 2019-04-27 ENCOUNTER — Other Ambulatory Visit: Payer: Self-pay | Admitting: *Deleted

## 2019-04-27 DIAGNOSIS — I83812 Varicose veins of left lower extremities with pain: Secondary | ICD-10-CM

## 2019-05-04 DIAGNOSIS — H401112 Primary open-angle glaucoma, right eye, moderate stage: Secondary | ICD-10-CM | POA: Diagnosis not present

## 2019-05-04 DIAGNOSIS — H401123 Primary open-angle glaucoma, left eye, severe stage: Secondary | ICD-10-CM | POA: Diagnosis not present

## 2019-05-18 DIAGNOSIS — N951 Menopausal and female climacteric states: Secondary | ICD-10-CM | POA: Diagnosis not present

## 2019-05-27 DIAGNOSIS — N951 Menopausal and female climacteric states: Secondary | ICD-10-CM | POA: Diagnosis not present

## 2019-05-27 DIAGNOSIS — M2559 Pain in other specified joint: Secondary | ICD-10-CM | POA: Diagnosis not present

## 2019-05-27 DIAGNOSIS — R5382 Chronic fatigue, unspecified: Secondary | ICD-10-CM | POA: Diagnosis not present

## 2019-06-02 ENCOUNTER — Ambulatory Visit (INDEPENDENT_AMBULATORY_CARE_PROVIDER_SITE_OTHER): Payer: Medicare Other

## 2019-06-02 ENCOUNTER — Other Ambulatory Visit: Payer: Self-pay

## 2019-06-02 ENCOUNTER — Ambulatory Visit (INDEPENDENT_AMBULATORY_CARE_PROVIDER_SITE_OTHER): Payer: Medicare Other | Admitting: Podiatry

## 2019-06-02 DIAGNOSIS — L84 Corns and callosities: Secondary | ICD-10-CM

## 2019-06-02 DIAGNOSIS — M7751 Other enthesopathy of right foot: Secondary | ICD-10-CM

## 2019-06-02 DIAGNOSIS — M2042 Other hammer toe(s) (acquired), left foot: Secondary | ICD-10-CM | POA: Diagnosis not present

## 2019-06-02 DIAGNOSIS — M2041 Other hammer toe(s) (acquired), right foot: Secondary | ICD-10-CM

## 2019-06-02 DIAGNOSIS — M79671 Pain in right foot: Secondary | ICD-10-CM

## 2019-06-02 DIAGNOSIS — L600 Ingrowing nail: Secondary | ICD-10-CM

## 2019-06-04 ENCOUNTER — Other Ambulatory Visit: Payer: Self-pay

## 2019-06-04 ENCOUNTER — Encounter: Payer: Self-pay | Admitting: Vascular Surgery

## 2019-06-04 ENCOUNTER — Ambulatory Visit (INDEPENDENT_AMBULATORY_CARE_PROVIDER_SITE_OTHER): Payer: Medicare Other | Admitting: Vascular Surgery

## 2019-06-04 VITALS — BP 121/68 | HR 60 | Temp 97.3°F | Resp 16 | Ht 62.0 in | Wt 194.0 lb

## 2019-06-04 DIAGNOSIS — I872 Venous insufficiency (chronic) (peripheral): Secondary | ICD-10-CM | POA: Diagnosis not present

## 2019-06-04 DIAGNOSIS — I83812 Varicose veins of left lower extremities with pain: Secondary | ICD-10-CM

## 2019-06-04 HISTORY — PX: ENDOVENOUS ABLATION SAPHENOUS VEIN W/ LASER: SUR449

## 2019-06-04 NOTE — Progress Notes (Signed)
     Laser Ablation Procedure    Date: 06/04/2019   [name] DOB:1935/05/12  Consent signed: Yes    Surgeon: Gae Gallop MD  Procedure: Laser Ablation: left Greater Saphenous Vein  BP 121/68 (BP Location: Right Arm, Patient Position: Sitting, Cuff Size: Large)   Pulse 60   Temp (!) 97.3 F (36.3 C) (Temporal)   Resp 16   Ht 5\' 2"  (1.575 m)   Wt 194 lb (88 kg)   SpO2 97%   BMI 35.48 kg/m   Tumescent Anesthesia: 450 cc 0.9% NaCl with 50 cc Lidocaine HCL 1%  and 15 cc 8.4% NaHCO3  Local Anesthesia: 3 cc Lidocaine HCL and NaHCO3 (ratio 2:1)  7 watts continuous mode     Total energy: 1653.8 Joules    Total  time: 236 Seconds Treatment Length  37 cm  Laser Fiber Ref. #  [id number]    Lot # [id number]    Patient tolerated procedure well  Notes: Patient wore face mask.  All staff members wore facial masks and facial shields/goggles.    Description of Procedure:  After marking the course of the secondary varicosities, the patient was placed on the operating table in the supine position, and the left leg was prepped and draped in sterile fashion.   Local anesthetic was administered and under ultrasound guidance the saphenous vein was accessed with a micro needle and guide wire; then the mirco puncture sheath was placed.  A guide wire was inserted saphenofemoral junction , followed by a 5 french sheath.  The position of the sheath and then the laser fiber below the junction was confirmed using the ultrasound.  Tumescent anesthesia was administered along the course of the saphenous vein using ultrasound guidance. The patient was placed in Trendelenburg position and protective laser glasses were placed on patient and staff, and the laser was fired at 7 watts continuous mode for a total of 1653.8 joules.       Steri strip was applied to the stab IV insertion site and ABD pads and thigh high compression stockings were applied.  Ace wrap bandages were applied over the left thigh and at  the top of the saphenofemoral junction. Blood loss was less than 15 cc.  Discharge instructions reviewed with patient and hardcopy of discharge instructions given to patient to take home. The patient ambulated out of the operating room having tolerated the procedure well.

## 2019-06-04 NOTE — Progress Notes (Signed)
Patient name: [name] MRN: [id number] DOB: 1935/06/06 Sex: female  REASON FOR VISIT: For laser ablation of the left great saphenous vein  HPI: [name] is a 84 y.o. female who presents for laser ablation of the left great saphenous vein.  I saw her on 04/02/2019 with bilateral leg swelling which was more significant on the left side.  She had no previous history of DVT.  She had tried thigh-high compression stockings with a gradient of 20-30, leg elevation and ibuprofen as needed for pain.  Her symptoms persisted despite these conservative measures.  I looked at her saphenous vein myself with the SonoSite.  She had reflux from the saphenofemoral junction to the knee and the vein was significantly dilated.  I felt she was a good candidate for laser ablation of the left great saphenous vein.  Current Outpatient Medications  Medication Sig Dispense Refill  . ANORO ELLIPTA 62.5-25 MCG/INH AEPB Inhale 1 puff into the lungs daily.    . brimonidine (ALPHAGAN) 0.2 % ophthalmic solution Place 1 drop into the left eye 2 times daily.    . carvedilol (COREG) 25 MG tablet Take 12.5 mg by mouth 2 (two) times daily. TAKE HALF A TABLET (12.5 MG) BY MOUTH TWICE DAILY.    . cholestyramine (QUESTRAN) 4 g packet Take 1 packet by mouth 2 (two) times daily.    . cycloSPORINE (RESTASIS) 0.05 % ophthalmic emulsion Place 1 drop into both eyes every 12 hours.    . furosemide (LASIX) 40 MG tablet Take 20 mg by mouth daily.     [name] latanoprost (XALATAN) 0.005 % ophthalmic solution Place 1 drop into the left eye nightly.    [name] lisinopril (PRINIVIL,ZESTRIL) 30 MG tablet take 1 tablet by mouth once daily 90 tablet 1  . predniSONE (DELTASONE) 20 MG tablet TAKE 2 TABLETS EACH DAY FOR 2 DAYS THEN TAKE 1 TABLET EACH DAY FOR 2 DAYS    . timolol (TIMOPTIC) 0.5 % ophthalmic solution Place 1 drop into both eyes 2 times daily.     No current facility-administered medications for this visit.    PHYSICAL EXAM: Vitals:   06/04/19  1118  BP: 121/68  Pulse: 60  Resp: 16  Temp: (!) 97.3 F (36.3 C)  TempSrc: Temporal  SpO2: 97%  Weight: 194 lb (88 kg)  Height: 5\' 2"  (1.575 m)    PROCEDURE: Endovenous laser ablation left great saphenous vein  TECHNIQUE: The patient was taken to the exam room and placed supine.  I looked at the left great saphenous vein myself with the SonoSite and it look like I could cannulate this just below the knee.  The left leg was prepped and draped in usual sterile fashion.  Under ultrasound guidance, after the skin was anesthetized, I cannulated the saphenous vein just below the knee.  A micropuncture sheath was introduced over the wire and then I advanced the J-wire.  This would not advance so I used the straight end of the wire.  Initially this went into a large tributary multiple times but I was able to direct this with some manual pressure until I got the wire up to the saphenofemoral junction.  I then advanced the 45 cm sheath to just below the saphenofemoral junction.  The wire and dilator were removed.  The laser fiber was positioned at the end of the sheath and the sheath retracted.  I position the sheath approximately 2-1/2 cm distal to the saphenofemoral junction.  Tumescent anesthesia was then administered stirred circumferentially  around the vein and the patient was then placed in Trendelenburg.  Laser ablation was then performed of the left great saphenous vein from 2-1/2 cm distal to the saphenofemoral junction to just below the knee.  1654 J of energy were used.  37 cm of vein were treated.  A pressure dressing was then applied.  The patient tolerated the procedure well.  She will return in 2 weeks for follow-up duplex.  Deitra Mayo [facility] [phone]

## 2019-06-05 ENCOUNTER — Other Ambulatory Visit: Payer: Self-pay | Admitting: Podiatry

## 2019-06-05 DIAGNOSIS — M7751 Other enthesopathy of right foot: Secondary | ICD-10-CM

## 2019-06-08 NOTE — Progress Notes (Signed)
Subjective:   Patient ID: [name], female   DOB: 84 y.o.   MRN: [id number]   HPI 84 year old female presents the office today for concerns of possible ingrown toenails to both of her big toenails as well as for pain to her right third toe. She states that her right third toes been tender for the last 5 months and she denies any injury. There is been no swelling or redness. She said no recent treatment.   Review of Systems  All other systems reviewed and are negative.  Past Medical History:  Diagnosis Date  . Arthritis   . Bilateral edema of lower extremity   . CKD (chronic kidney disease)   . Constipation   . Degenerative arthritis of hip    s/p THR 05/2011  . Depression   . Glaucoma   . Headache(784.0)   . HLD (hyperlipidemia)   . Hypertension   . OAB (overactive bladder)   . Osteoporosis   . Primary osteoarthritis of right knee   . Slow transit constipation   . Unsteady gait   . Venous insufficiency     Past Surgical History:  Procedure Laterality Date  .  cataracts removed  2013 L  . ABDOMINAL HYSTERECTOMY  2003  . BREAST BIOPSY  1960  . BREAST CYSTS     X2 BENIGN  . BREAST EXCISIONAL BIOPSY Left   . BREAST SURGERY    . ENDOVENOUS ABLATION SAPHENOUS VEIN W/ LASER Left 06/04/2019   endovenous laser ablation left greater saphenous vein by Gae Gallop MD   . [place] Bilateral 2013, 2014   bond ([facility])  . REDUCTION MAMMAPLASTY Bilateral 2000  . TOTAL HIP ARTHROPLASTY  06/01/2011   Procedure: TOTAL HIP ARTHROPLASTY ANTERIOR APPROACH;  Surgeon: Mcarthur Rossetti, MD;  Location: [facility];  Service: Orthopedics;  Laterality: Left;  Left Total Hip Replacement, Direct Anterior Approach  . TOTAL KNEE ARTHROPLASTY Right 03/14/2015   Procedure: TOTAL KNEE ARTHROPLASTY;  Surgeon: Gaynelle Arabian, MD;  Location: [facility];  Service: Orthopedics;  Laterality: Right;     Current Outpatient Medications:  .  ANORO ELLIPTA 62.5-25 MCG/INH AEPB, Inhale 1 puff into the  lungs daily., Disp: , Rfl:  .  brimonidine (ALPHAGAN) 0.2 % ophthalmic solution, Place 1 drop into the left eye 2 times daily., Disp: , Rfl:  .  carvedilol (COREG) 25 MG tablet, Take 12.5 mg by mouth 2 (two) times daily. TAKE HALF A TABLET (12.5 MG) BY MOUTH TWICE DAILY., Disp: , Rfl:  .  cholestyramine (QUESTRAN) 4 g packet, Take 1 packet by mouth 2 (two) times daily., Disp: , Rfl:  .  cycloSPORINE (RESTASIS) 0.05 % ophthalmic emulsion, Place 1 drop into both eyes every 12 hours., Disp: , Rfl:  .  furosemide (LASIX) 40 MG tablet, Take 20 mg by mouth daily. , Disp: , Rfl:  .  latanoprost (XALATAN) 0.005 % ophthalmic solution, Place 1 drop into the left eye nightly., Disp: , Rfl:  .  lisinopril (PRINIVIL,ZESTRIL) 30 MG tablet, take 1 tablet by mouth once daily, Disp: 90 tablet, Rfl: 1 .  predniSONE (DELTASONE) 20 MG tablet, TAKE 2 TABLETS EACH DAY FOR 2 DAYS THEN TAKE 1 TABLET EACH DAY FOR 2 DAYS, Disp: , Rfl:  .  timolol (TIMOPTIC) 0.5 % ophthalmic solution, Place 1 drop into both eyes 2 times daily., Disp: , Rfl:   No Known Allergies       Objective:  Physical Exam  General: AAO x3, NAD  Dermatological: Hyperkeratotic tissue  present distal aspects of bilateral third toes with the right side worse than left. Also mild incurvation present of the hallux toenails with any edema, erythema, drainage or pus or any signs of infection. There is no open lesions.  Vascular: Dorsalis Pedis artery and Posterior Tibial artery pedal pulses are 2/4 bilateral with immedate capillary fill time. There is no pain with calf compression, swelling, warmth, erythema.   Neruologic: Grossly intact via light touch bilateral.  Musculoskeletal: Hammertoe contractures are present. There is tenderness at the distal aspect the right third toe-the tenderness is coming from hammertoe as well as the hyperkeratotic tissue. Muscular strength 5/5 in all groups tested bilateral.  Gait: Unassisted, Nonantalgic.        Assessment:   Digital deformity resulting hyperkeratotic tissue, ingrown toenail without signs of infection     Plan:  -Treatment options discussed including all alternatives, risks, and complications -Etiology of symptoms were discussed -X-rays obtained reviewed. Digital contractures are present. Is no evidence of acute fracture. -Debrided hyperkeratotic tissue at the distal aspect of the third toes bilaterally. Dispensed offloading pads. -Debrided hallux nails with any complications or bleeding. Discussed chemical matricectomy.    Trula Slade DPM

## 2019-06-12 DIAGNOSIS — I10 Essential (primary) hypertension: Secondary | ICD-10-CM | POA: Diagnosis not present

## 2019-06-15 ENCOUNTER — Other Ambulatory Visit: Payer: Self-pay | Admitting: *Deleted

## 2019-06-15 DIAGNOSIS — I872 Venous insufficiency (chronic) (peripheral): Secondary | ICD-10-CM

## 2019-06-15 DIAGNOSIS — K648 Other hemorrhoids: Secondary | ICD-10-CM | POA: Diagnosis not present

## 2019-06-15 DIAGNOSIS — R197 Diarrhea, unspecified: Secondary | ICD-10-CM | POA: Diagnosis not present

## 2019-06-15 DIAGNOSIS — I83812 Varicose veins of left lower extremities with pain: Secondary | ICD-10-CM

## 2019-06-16 ENCOUNTER — Telehealth: Payer: Self-pay

## 2019-06-16 NOTE — Telephone Encounter (Signed)
Telephone call received from patient reporting left leg soreness and bruising from laser ablation on 06/04/19. She denies any additional symptoms. Advised patient it is not uncommon to have discomfort or bruising. She should continue to wear her compression stockings, can take Tylenol and apply moist heat to area for pain. Pt voiced understanding.

## 2019-06-18 ENCOUNTER — Encounter (HOSPITAL_COMMUNITY): Payer: Medicare Other

## 2019-06-18 ENCOUNTER — Ambulatory Visit: Payer: Medicare Other | Admitting: Vascular Surgery

## 2019-06-25 ENCOUNTER — Ambulatory Visit (HOSPITAL_COMMUNITY)
Admission: RE | Admit: 2019-06-25 | Discharge: 2019-06-25 | Disposition: A | Discharge status: Home / Self Care | Payer: Medicare Other | Source: Ambulatory Visit | Attending: Vascular Surgery | Admitting: Vascular Surgery

## 2019-06-25 ENCOUNTER — Other Ambulatory Visit: Payer: Self-pay

## 2019-06-25 ENCOUNTER — Ambulatory Visit (INDEPENDENT_AMBULATORY_CARE_PROVIDER_SITE_OTHER): Payer: Medicare Other | Admitting: Vascular Surgery

## 2019-06-25 ENCOUNTER — Encounter: Payer: Self-pay | Admitting: Vascular Surgery

## 2019-06-25 VITALS — BP 122/69 | HR 64 | Temp 97.2°F | Resp 14

## 2019-06-25 DIAGNOSIS — H04122 Dry eye syndrome of left lacrimal gland: Secondary | ICD-10-CM | POA: Diagnosis not present

## 2019-06-25 DIAGNOSIS — I872 Venous insufficiency (chronic) (peripheral): Secondary | ICD-10-CM | POA: Diagnosis not present

## 2019-06-25 DIAGNOSIS — I83812 Varicose veins of left lower extremities with pain: Secondary | ICD-10-CM | POA: Insufficient documentation

## 2019-06-25 DIAGNOSIS — H401123 Primary open-angle glaucoma, left eye, severe stage: Secondary | ICD-10-CM | POA: Diagnosis not present

## 2019-06-25 DIAGNOSIS — T1512XA Foreign body in conjunctival sac, left eye, initial encounter: Secondary | ICD-10-CM | POA: Diagnosis not present

## 2019-06-25 DIAGNOSIS — H16102 Unspecified superficial keratitis, left eye: Secondary | ICD-10-CM | POA: Diagnosis not present

## 2019-06-25 NOTE — Progress Notes (Signed)
Patient name: [name] MRN: [id number] DOB: 04-30-1935 Sex: female  REASON FOR VISIT: Follow-up after endovenous laser ablation of the left great saphenous vein  HPI: [name] is a 84 y.o. female who had presented with chronic venous insufficiency and significant pain in the left lower extremity.  She was found to have reflux in the left great saphenous vein.  She failed conservative measures was felt to be a good candidate for laser ablation of the left great saphenous vein.  On 06/04/2019 she underwent laser ablation of the left great saphenous vein from 2-1/2 cm distal to the saphenofemoral junction to below the knee.  She comes in for a follow-up visit.  She has no specific complaints.  She denies significant pain or swelling.  She is now wearing knee-high stockings.  SYMPTOMS: Her symptoms in the left leg have improved.  CONSERVATIVE TREATMENT: She is continuing to elevate her leg and wear knee-high stockings.  Current Outpatient Medications  Medication Sig Dispense Refill  . ANORO ELLIPTA 62.5-25 MCG/INH AEPB Inhale 1 puff into the lungs daily.    . brimonidine (ALPHAGAN) 0.2 % ophthalmic solution Place 1 drop into the left eye 2 times daily.    . carvedilol (COREG) 25 MG tablet Take 12.5 mg by mouth 2 (two) times daily. TAKE HALF A TABLET (12.5 MG) BY MOUTH TWICE DAILY.    . cholestyramine (QUESTRAN) 4 g packet Take 1 packet by mouth 2 (two) times daily.    . cycloSPORINE (RESTASIS) 0.05 % ophthalmic emulsion Place 1 drop into both eyes every 12 hours.    . furosemide (LASIX) 40 MG tablet Take 20 mg by mouth daily.     [name] latanoprost (XALATAN) 0.005 % ophthalmic solution Place 1 drop into the left eye nightly.    [name] lisinopril (PRINIVIL,ZESTRIL) 30 MG tablet take 1 tablet by mouth once daily 90 tablet 1  . predniSONE (DELTASONE) 20 MG tablet TAKE 2 TABLETS EACH DAY FOR 2 DAYS THEN TAKE 1 TABLET EACH DAY FOR 2 DAYS    . timolol (TIMOPTIC) 0.5 % ophthalmic solution Place 1 drop into  both eyes 2 times daily.     No current facility-administered medications for this visit.   REVIEW OF SYSTEMS: Valu.Nieves ] denotes positive finding; [  ] denotes negative finding  CARDIOVASCULAR:  [ ]  chest pain   [ ]  dyspnea on exertion  [ ]  leg swelling  CONSTITUTIONAL:  [ ]  fever   [ ]  chills  PHYSICAL EXAM: There were no vitals filed for this visit.  Epic was down during this visit and the vitals were not recorded. GENERAL: The patient is a well-nourished female, in no acute distress. The vital signs are documented above. CARDIOVASCULAR: There is a regular rate and rhythm. PULMONARY: There is good air exchange bilaterally without wheezing or rales. VASCULAR: She has no significant bruising in the left leg.  She has mild left leg swelling with hyperpigmentation.  DATA:  VENOUS DUPLEX: I have independently interpreted her venous duplex scan today.  There is no evidence of DVT in the left lower extremity.  The left great saphenous vein is closed from 1.4 cm distal to the saphenofemoral junction to the proximal calf.  MEDICAL ISSUES:  STATUS POST LASER ABLATION LEFT GREAT SAPHENOUS VEIN: The patient is doing well status post laser ablation of the left great saphenous vein.  She has no evidence of DVT and the vein is successfully closed from the saphenofemoral junction to the proximal calf.  We have again  discussed the importance of intermittent leg elevation.  She will wear her knee-high compression stockings at this point.  I encouraged her to stay as active as possible.  I will see her back as needed.  Deitra Mayo [facility] [phone]

## 2019-07-03 DIAGNOSIS — J449 Chronic obstructive pulmonary disease, unspecified: Secondary | ICD-10-CM | POA: Diagnosis not present

## 2019-07-03 DIAGNOSIS — R0602 Shortness of breath: Secondary | ICD-10-CM | POA: Diagnosis not present

## 2019-07-03 DIAGNOSIS — R197 Diarrhea, unspecified: Secondary | ICD-10-CM | POA: Diagnosis not present

## 2019-07-03 DIAGNOSIS — R5383 Other fatigue: Secondary | ICD-10-CM | POA: Diagnosis not present

## 2019-07-03 DIAGNOSIS — R600 Localized edema: Secondary | ICD-10-CM | POA: Diagnosis not present

## 2019-07-03 DIAGNOSIS — I10 Essential (primary) hypertension: Secondary | ICD-10-CM | POA: Diagnosis not present

## 2019-07-03 DIAGNOSIS — H409 Unspecified glaucoma: Secondary | ICD-10-CM | POA: Diagnosis not present

## 2019-07-06 ENCOUNTER — Other Ambulatory Visit (HOSPITAL_COMMUNITY): Payer: Medicare Other

## 2019-07-06 DIAGNOSIS — H401112 Primary open-angle glaucoma, right eye, moderate stage: Secondary | ICD-10-CM | POA: Diagnosis not present

## 2019-07-06 DIAGNOSIS — H401123 Primary open-angle glaucoma, left eye, severe stage: Secondary | ICD-10-CM | POA: Diagnosis not present

## 2019-07-21 ENCOUNTER — Telehealth: Payer: Self-pay

## 2019-07-21 NOTE — Telephone Encounter (Signed)
Pt called to report pinkish-red skin discoloration above ankle extending around the leg with foot swelling that has increased since last OV. Denies any other symptoms. Advised pt will discuss with provider to determine if a repeat venous duplex is needed prior scheduling appt w/Dr. Scot Dock. Then scheduling will contact with appt. Pt voiced understanding.

## 2019-07-23 ENCOUNTER — Ambulatory Visit (INDEPENDENT_AMBULATORY_CARE_PROVIDER_SITE_OTHER): Payer: Medicare Other | Admitting: Physician Assistant

## 2019-07-23 ENCOUNTER — Ambulatory Visit: Payer: Medicare Other

## 2019-07-23 ENCOUNTER — Other Ambulatory Visit: Payer: Self-pay

## 2019-07-23 VITALS — BP 118/63 | HR 64 | Temp 96.7°F | Resp 20 | Ht 62.0 in | Wt 190.4 lb

## 2019-07-23 DIAGNOSIS — I872 Venous insufficiency (chronic) (peripheral): Secondary | ICD-10-CM | POA: Diagnosis not present

## 2019-07-23 NOTE — Progress Notes (Signed)
Office Note     CC:  follow up Requesting Provider:  [name], MD  HPI: [name] is a 84 y.o. (Apr 25, 1935) female who presents for follow up of chronic venous insufficiency of the left lower extremity. She recently underwent a venous laser ablation of the left greater saphenous vein from SFJ to below knee on 06/04/19 by Dr. Scot Dock. She is here for follow up with concerns of swelling and redness. This started over the past week. She says she has always had an area of some redness/ dark area on the front of her left leg but this past week was first time she noticed that it seems to be becoming circumferential and it is sore to touch. She otherwise denies any pain in the leg. She says the swelling is better but has not improved as much as she had hoped after her ablation. She has been wearing her knee high compression stockings daily and elevating. She denies any pain or discomfort in right leg. She does not have any fever or chills  Past Medical History:  Diagnosis Date  . Arthritis   . Bilateral edema of lower extremity   . CKD (chronic kidney disease)   . Constipation   . Degenerative arthritis of hip    s/p THR 05/2011  . Depression   . Glaucoma   . Headache(784.0)   . HLD (hyperlipidemia)   . Hypertension   . OAB (overactive bladder)   . Osteoporosis   . Primary osteoarthritis of right knee   . Slow transit constipation   . Unsteady gait   . Venous insufficiency     Past Surgical History:  Procedure Laterality Date  .  cataracts removed  2013 L  . ABDOMINAL HYSTERECTOMY  2003  . BREAST BIOPSY  1960  . BREAST CYSTS     X2 BENIGN  . BREAST EXCISIONAL BIOPSY Left   . BREAST SURGERY    . ENDOVENOUS ABLATION SAPHENOUS VEIN W/ LASER Left 06/04/2019   endovenous laser ablation left greater saphenous vein by Gae Gallop MD   . [place] Bilateral 2013, 2014   bond ([facility])  . REDUCTION MAMMAPLASTY Bilateral 2000  . TOTAL HIP ARTHROPLASTY  06/01/2011    Procedure: TOTAL HIP ARTHROPLASTY ANTERIOR APPROACH;  Surgeon: Mcarthur Rossetti, MD;  Location: [facility];  Service: Orthopedics;  Laterality: Left;  Left Total Hip Replacement, Direct Anterior Approach  . TOTAL KNEE ARTHROPLASTY Right 03/14/2015   Procedure: TOTAL KNEE ARTHROPLASTY;  Surgeon: Gaynelle Arabian, MD;  Location: [facility];  Service: Orthopedics;  Laterality: Right;    Social History   Socioeconomic History  . Marital status: Divorced    Spouse name: Not on file  . Number of children: Not on file  . Years of education: Not on file  . Highest education level: Not on file  Occupational History  . Not on file  Tobacco Use  . Smoking status: Former Smoker    Packs/day: 1.00    Years: 35.00    Pack years: 35.00    Types: Cigarettes    Quit date: 05/27/1980    Years since quitting: 39.1  . Smokeless tobacco: Never Used  Vaping Use  . Vaping Use: Never used  Substance and Sexual Activity  . Alcohol use: No    Alcohol/week: 0.0 standard drinks  . Drug use: No  . Sexual activity: Not on file  Other Topics Concern  . Not on file  Social History Narrative   [place].  One child, [name].  Education: college.   Social Determinants of Health   Financial Resource Strain:   . Difficulty of Paying Living Expenses:   Food Insecurity:   . Worried About Charity fundraiser in the Last Year:   . Arboriculturist in the Last Year:   Transportation Needs:   . Film/video editor (Medical):   [name] Lack of Transportation (Non-Medical):   Physical Activity:   . Days of Exercise per Week:   . Minutes of Exercise per Session:   Stress:   . Feeling of Stress :   Social Connections:   . Frequency of Communication with Friends and Family:   . Frequency of Social Gatherings with Friends and Family:   . Attends Religious Services:   . Active Member of Clubs or Organizations:   . Attends Archivist Meetings:   [name] Marital Status:   Intimate Partner  Violence:   . Fear of Current or Ex-Partner:   . Emotionally Abused:   [name] Physically Abused:   . Sexually Abused:     Family History  Problem Relation Age of Onset  . Ovarian cancer Mother   . Alcohol abuse Father   . Emphysema Father   . Stroke Brother     Current Outpatient Medications  Medication Sig Dispense Refill  . ANORO ELLIPTA 62.5-25 MCG/INH AEPB Inhale 1 puff into the lungs daily.    . brimonidine (ALPHAGAN) 0.2 % ophthalmic solution Place 1 drop into the left eye 2 times daily.    . carvedilol (COREG) 25 MG tablet Take 12.5 mg by mouth 2 (two) times daily. TAKE HALF A TABLET (12.5 MG) BY MOUTH TWICE DAILY.    . cholestyramine (QUESTRAN) 4 g packet Take 1 packet by mouth 2 (two) times daily.    . colestipol (COLESTID) 1 g tablet Take 2 g by mouth daily.    . cycloSPORINE (RESTASIS) 0.05 % ophthalmic emulsion Place 1 drop into both eyes every 12 hours.    . furosemide (LASIX) 40 MG tablet Take 20 mg by mouth daily.     [name] latanoprost (XALATAN) 0.005 % ophthalmic solution Place 1 drop into the left eye nightly.    [name] lisinopril (PRINIVIL,ZESTRIL) 30 MG tablet take 1 tablet by mouth once daily 90 tablet 1  . predniSONE (DELTASONE) 20 MG tablet TAKE 2 TABLETS EACH DAY FOR 2 DAYS THEN TAKE 1 TABLET EACH DAY FOR 2 DAYS    . PROAIR HFA 108 (90 Base) MCG/ACT inhaler Inhale 2 puffs into the lungs every 4 (four) hours as needed.    . timolol (TIMOPTIC) 0.5 % ophthalmic solution Place 1 drop into both eyes 2 times daily.    . Travoprost, BAK Free, (TRAVATAN) 0.004 % SOLN ophthalmic solution Place 1 drop into the left eye nightly.     No current facility-administered medications for this visit.    No Known Allergies   REVIEW OF SYSTEMS:  [X]  denotes positive finding, [ ]  denotes negative finding Cardiac  Comments:  Chest pain or chest pressure:    Shortness of breath upon exertion:    Short of breath when lying flat:    Irregular heart rhythm:        Vascular    Pain in  calf, thigh, or hip brought on by ambulation:    Pain in feet at night that wakes you up from your sleep:     Blood clot in your veins:    Leg swelling:  X       Pulmonary    Oxygen at home:    Productive cough:     Wheezing:         Neurologic    Sudden weakness in arms or legs:     Sudden numbness in arms or legs:     Sudden onset of difficulty speaking or slurred speech:    Temporary loss of vision in one eye:     Problems with dizziness:         Gastrointestinal    Blood in stool:     Vomited blood:         Genitourinary    Burning when urinating:     Blood in urine:        Psychiatric    Major depression:         Hematologic    Bleeding problems:    Problems with blood clotting too easily:        Skin    Rashes or ulcers:        Constitutional    Fever or chills:      PHYSICAL EXAMINATION:  Vitals:   07/23/19 1037  BP: 118/63  Pulse: 64  Resp: 20  Temp: (!) 96.7 F (35.9 C)  TempSrc: Temporal  SpO2: 95%  Weight: 190 lb 6.4 oz (86.4 kg)  Height: 5\' 2"  (1.575 m)    General:  WDWN in NAD; vital signs documented above Gait: Normal HENT: WNL, normocephalic Pulmonary: normal non-labored breathing Cardiac: regular HR, without  Murmurs without carotid bruit Abdomen: soft, NT, no masses Vascular Exam/Pulses:2+ bilateral radial pulses, 2+ bilateral femoral pulses, 2+ left popliteal pulse, 2+ DP. Left foot warm. Motor and sensory intact.  Left leg swelling greater than right. Left leg with distal anterior extending medially to posterior leg area of hyperpigmentation. This is slightly tender to palpation. No ulceration Extremities: without ischemic changes, without Gangrene , without cellulitis; without open wounds;  Musculoskeletal: no muscle wasting or atrophy  Neurologic: A&O X 3;  No focal weakness or paresthesias are detected Psychiatric:  The pt has Normal affect.   ASSESSMENT/PLAN:: 85 y.o. female here for follow up for chronic venous insufficiency  of left lower extremity s/p laser ablation on 06/04/19. She is doing well post ablation. She does have some continued lower leg swelling and area of hyperpigmentation in the distal left leg that is increasing. She has no ulceration or leg pain - Reassurance provided to the patient that she has nothing life threatening going on in her left leg - She does not have any signs of DVT - I offered to have her follow up with repeat venous reflux study of her left leg for possible further intervention on her below knee GSV reflux/ SSV however she does not want any further procedures at this time especially if I cannot guarantee that it will resolve all of her swelling - I have encouraged her to continue her knee high compression stockings and elevation PRN - if she changes her mind or has worsening symptoms she will call for earlier follow up - She feels comfortable following up as needed   Karoline Caldwell, PA-C [facility] [phone]  Clinic MD:  Dr. Scot Dock

## 2019-08-07 DIAGNOSIS — E7800 Pure hypercholesterolemia, unspecified: Secondary | ICD-10-CM | POA: Diagnosis not present

## 2019-08-07 DIAGNOSIS — N951 Menopausal and female climacteric states: Secondary | ICD-10-CM | POA: Diagnosis not present

## 2019-08-07 DIAGNOSIS — R5382 Chronic fatigue, unspecified: Secondary | ICD-10-CM | POA: Diagnosis not present

## 2019-08-12 DIAGNOSIS — Z1339 Encounter for screening examination for other mental health and behavioral disorders: Secondary | ICD-10-CM | POA: Diagnosis not present

## 2019-08-12 DIAGNOSIS — I129 Hypertensive chronic kidney disease with stage 1 through stage 4 chronic kidney disease, or unspecified chronic kidney disease: Secondary | ICD-10-CM | POA: Diagnosis not present

## 2019-08-12 DIAGNOSIS — I10 Essential (primary) hypertension: Secondary | ICD-10-CM | POA: Diagnosis not present

## 2019-08-12 DIAGNOSIS — Z6835 Body mass index (BMI) 35.0-35.9, adult: Secondary | ICD-10-CM | POA: Diagnosis not present

## 2019-08-12 DIAGNOSIS — Z1331 Encounter for screening for depression: Secondary | ICD-10-CM | POA: Diagnosis not present

## 2019-08-12 DIAGNOSIS — N951 Menopausal and female climacteric states: Secondary | ICD-10-CM | POA: Diagnosis not present

## 2019-08-12 DIAGNOSIS — M2559 Pain in other specified joint: Secondary | ICD-10-CM | POA: Diagnosis not present

## 2019-08-12 DIAGNOSIS — R5382 Chronic fatigue, unspecified: Secondary | ICD-10-CM | POA: Diagnosis not present

## 2019-08-12 DIAGNOSIS — Z72820 Sleep deprivation: Secondary | ICD-10-CM | POA: Diagnosis not present

## 2019-08-12 DIAGNOSIS — E7800 Pure hypercholesterolemia, unspecified: Secondary | ICD-10-CM | POA: Diagnosis not present

## 2019-08-21 ENCOUNTER — Ambulatory Visit (INDEPENDENT_AMBULATORY_CARE_PROVIDER_SITE_OTHER): Payer: Medicare Other | Admitting: Critical Care Medicine

## 2019-08-21 ENCOUNTER — Other Ambulatory Visit: Payer: Self-pay

## 2019-08-21 DIAGNOSIS — R0600 Dyspnea, unspecified: Secondary | ICD-10-CM

## 2019-08-21 DIAGNOSIS — R0609 Other forms of dyspnea: Secondary | ICD-10-CM

## 2019-08-21 LAB — PULMONARY FUNCTION TEST
DL/VA % pred: 81 %
DL/VA: 3.4 ml/min/mmHg/L
DLCO cor % pred: 71 %
DLCO cor: 12.04 ml/min/mmHg
DLCO unc % pred: 71 %
DLCO unc: 12.04 ml/min/mmHg
FEF 25-75 Post: 1.01 L/sec
FEF 25-75 Pre: 1.21 L/sec
FEF2575-%Change-Post: -16 %
FEF2575-%Pred-Post: 88 %
FEF2575-%Pred-Pre: 106 %
FEV1-%Change-Post: -1 %
FEV1-%Pred-Post: 95 %
FEV1-%Pred-Pre: 96 %
FEV1-Post: 1.51 L
FEV1-Pre: 1.54 L
FEV1FVC-%Change-Post: 0 %
FEV1FVC-%Pred-Pre: 102 %
FEV6-%Change-Post: -1 %
FEV6-%Pred-Post: 99 %
FEV6-%Pred-Pre: 100 %
FEV6-Post: 2.02 L
FEV6-Pre: 2.04 L
FEV6FVC-%Pred-Post: 106 %
FEV6FVC-%Pred-Pre: 106 %
FVC-%Change-Post: -1 %
FVC-%Pred-Post: 93 %
FVC-%Pred-Pre: 94 %
FVC-Post: 2.02 L
FVC-Pre: 2.04 L
Post FEV1/FVC ratio: 75 %
Post FEV6/FVC ratio: 100 %
Pre FEV1/FVC ratio: 75 %
Pre FEV6/FVC Ratio: 100 %
RV % pred: 75 %
RV: 1.72 L
TLC % pred: 89 %
TLC: 4.13 L

## 2019-08-21 NOTE — Progress Notes (Signed)
PFT done today. 

## 2019-08-25 ENCOUNTER — Encounter: Payer: Self-pay | Admitting: Podiatry

## 2019-08-28 DIAGNOSIS — Z6835 Body mass index (BMI) 35.0-35.9, adult: Secondary | ICD-10-CM | POA: Diagnosis not present

## 2019-08-28 DIAGNOSIS — I10 Essential (primary) hypertension: Secondary | ICD-10-CM | POA: Diagnosis not present

## 2019-09-09 DIAGNOSIS — K900 Celiac disease: Secondary | ICD-10-CM | POA: Diagnosis not present

## 2019-09-09 DIAGNOSIS — N289 Disorder of kidney and ureter, unspecified: Secondary | ICD-10-CM | POA: Diagnosis not present

## 2019-09-23 DIAGNOSIS — H401123 Primary open-angle glaucoma, left eye, severe stage: Secondary | ICD-10-CM | POA: Diagnosis not present

## 2019-09-23 DIAGNOSIS — H04123 Dry eye syndrome of bilateral lacrimal glands: Secondary | ICD-10-CM | POA: Diagnosis not present

## 2019-09-23 DIAGNOSIS — H401112 Primary open-angle glaucoma, right eye, moderate stage: Secondary | ICD-10-CM | POA: Diagnosis not present

## 2019-10-08 DIAGNOSIS — E785 Hyperlipidemia, unspecified: Secondary | ICD-10-CM | POA: Diagnosis not present

## 2019-10-09 DIAGNOSIS — M79662 Pain in left lower leg: Secondary | ICD-10-CM | POA: Diagnosis not present

## 2019-10-12 ENCOUNTER — Other Ambulatory Visit (HOSPITAL_COMMUNITY): Payer: Self-pay | Admitting: Orthopedic Surgery

## 2019-10-12 ENCOUNTER — Encounter (HOSPITAL_COMMUNITY): Payer: Medicare Other

## 2019-10-12 DIAGNOSIS — M7989 Other specified soft tissue disorders: Secondary | ICD-10-CM

## 2019-10-12 DIAGNOSIS — M79662 Pain in left lower leg: Secondary | ICD-10-CM

## 2019-10-13 DIAGNOSIS — Z0000 Encounter for general adult medical examination without abnormal findings: Secondary | ICD-10-CM | POA: Diagnosis not present

## 2019-10-13 DIAGNOSIS — R0609 Other forms of dyspnea: Secondary | ICD-10-CM | POA: Diagnosis not present

## 2019-10-13 DIAGNOSIS — H04123 Dry eye syndrome of bilateral lacrimal glands: Secondary | ICD-10-CM | POA: Diagnosis not present

## 2019-10-13 DIAGNOSIS — N3281 Overactive bladder: Secondary | ICD-10-CM | POA: Diagnosis not present

## 2019-10-13 DIAGNOSIS — R82998 Other abnormal findings in urine: Secondary | ICD-10-CM | POA: Diagnosis not present

## 2019-10-13 DIAGNOSIS — M25561 Pain in right knee: Secondary | ICD-10-CM | POA: Diagnosis not present

## 2019-10-13 DIAGNOSIS — Z23 Encounter for immunization: Secondary | ICD-10-CM | POA: Diagnosis not present

## 2019-10-13 DIAGNOSIS — M5136 Other intervertebral disc degeneration, lumbar region: Secondary | ICD-10-CM | POA: Diagnosis not present

## 2019-10-13 DIAGNOSIS — E785 Hyperlipidemia, unspecified: Secondary | ICD-10-CM | POA: Diagnosis not present

## 2019-10-13 DIAGNOSIS — J449 Chronic obstructive pulmonary disease, unspecified: Secondary | ICD-10-CM | POA: Diagnosis not present

## 2019-10-13 DIAGNOSIS — G4710 Hypersomnia, unspecified: Secondary | ICD-10-CM | POA: Diagnosis not present

## 2019-10-13 DIAGNOSIS — M25552 Pain in left hip: Secondary | ICD-10-CM | POA: Diagnosis not present

## 2019-10-13 DIAGNOSIS — I10 Essential (primary) hypertension: Secondary | ICD-10-CM | POA: Diagnosis not present

## 2019-10-14 ENCOUNTER — Ambulatory Visit (HOSPITAL_COMMUNITY)
Admission: RE | Admit: 2019-10-14 | Discharge: 2019-10-14 | Disposition: A | Discharge status: Home / Self Care | Payer: Medicare Other | Source: Ambulatory Visit | Attending: Orthopedic Surgery | Admitting: Orthopedic Surgery

## 2019-10-14 ENCOUNTER — Other Ambulatory Visit: Payer: Self-pay

## 2019-10-14 DIAGNOSIS — I8393 Asymptomatic varicose veins of bilateral lower extremities: Secondary | ICD-10-CM

## 2019-10-14 DIAGNOSIS — M79662 Pain in left lower leg: Secondary | ICD-10-CM | POA: Insufficient documentation

## 2019-10-14 DIAGNOSIS — M7989 Other specified soft tissue disorders: Secondary | ICD-10-CM | POA: Insufficient documentation

## 2019-11-11 DIAGNOSIS — G5602 Carpal tunnel syndrome, left upper limb: Secondary | ICD-10-CM | POA: Diagnosis not present

## 2019-11-11 DIAGNOSIS — M13842 Other specified arthritis, left hand: Secondary | ICD-10-CM | POA: Diagnosis not present

## 2019-11-25 ENCOUNTER — Institutional Professional Consult (permissible substitution): Payer: PRIVATE HEALTH INSURANCE | Admitting: Neurology

## 2019-12-11 DIAGNOSIS — Z23 Encounter for immunization: Secondary | ICD-10-CM | POA: Diagnosis not present

## 2020-02-04 ENCOUNTER — Other Ambulatory Visit: Payer: Self-pay | Admitting: Internal Medicine

## 2020-02-04 DIAGNOSIS — Z1231 Encounter for screening mammogram for malignant neoplasm of breast: Secondary | ICD-10-CM

## 2020-03-14 DIAGNOSIS — Z20828 Contact with and (suspected) exposure to other viral communicable diseases: Secondary | ICD-10-CM | POA: Diagnosis not present

## 2020-03-14 DIAGNOSIS — Z1159 Encounter for screening for other viral diseases: Secondary | ICD-10-CM | POA: Diagnosis not present

## 2020-03-15 ENCOUNTER — Ambulatory Visit
Admission: RE | Admit: 2020-03-15 | Discharge: 2020-03-15 | Disposition: A | Discharge status: Home / Self Care | Payer: Medicare Other | Source: Ambulatory Visit | Attending: Internal Medicine | Admitting: Internal Medicine

## 2020-03-15 ENCOUNTER — Other Ambulatory Visit: Payer: Self-pay

## 2020-03-15 DIAGNOSIS — I10 Essential (primary) hypertension: Secondary | ICD-10-CM | POA: Diagnosis not present

## 2020-03-15 DIAGNOSIS — S0990XA Unspecified injury of head, initial encounter: Secondary | ICD-10-CM | POA: Diagnosis not present

## 2020-03-15 DIAGNOSIS — W19XXXA Unspecified fall, initial encounter: Secondary | ICD-10-CM | POA: Diagnosis not present

## 2020-03-15 DIAGNOSIS — R4789 Other speech disturbances: Secondary | ICD-10-CM | POA: Diagnosis not present

## 2020-03-15 DIAGNOSIS — Z1231 Encounter for screening mammogram for malignant neoplasm of breast: Secondary | ICD-10-CM

## 2020-03-16 ENCOUNTER — Other Ambulatory Visit: Payer: Self-pay | Admitting: Internal Medicine

## 2020-03-16 DIAGNOSIS — S0990XA Unspecified injury of head, initial encounter: Secondary | ICD-10-CM

## 2020-03-16 DIAGNOSIS — R4789 Other speech disturbances: Secondary | ICD-10-CM

## 2020-03-17 ENCOUNTER — Other Ambulatory Visit: Payer: Self-pay

## 2020-03-17 ENCOUNTER — Ambulatory Visit (HOSPITAL_COMMUNITY): Payer: Medicare Other

## 2020-03-17 ENCOUNTER — Ambulatory Visit (HOSPITAL_COMMUNITY)
Admission: RE | Admit: 2020-03-17 | Discharge: 2020-03-17 | Disposition: A | Discharge status: Home / Self Care | Payer: Medicare Other | Source: Ambulatory Visit | Attending: Internal Medicine | Admitting: Internal Medicine

## 2020-03-17 DIAGNOSIS — S0990XA Unspecified injury of head, initial encounter: Secondary | ICD-10-CM

## 2020-03-17 DIAGNOSIS — R4789 Other speech disturbances: Secondary | ICD-10-CM

## 2020-03-17 DIAGNOSIS — J3489 Other specified disorders of nose and nasal sinuses: Secondary | ICD-10-CM | POA: Diagnosis not present

## 2020-03-17 DIAGNOSIS — G9389 Other specified disorders of brain: Secondary | ICD-10-CM | POA: Diagnosis not present

## 2020-03-18 ENCOUNTER — Ambulatory Visit (HOSPITAL_COMMUNITY): Payer: Medicare Other

## 2020-03-21 ENCOUNTER — Ambulatory Visit (INDEPENDENT_AMBULATORY_CARE_PROVIDER_SITE_OTHER): Payer: Medicare Other

## 2020-03-21 ENCOUNTER — Encounter: Payer: Self-pay | Admitting: Orthopaedic Surgery

## 2020-03-21 ENCOUNTER — Ambulatory Visit (INDEPENDENT_AMBULATORY_CARE_PROVIDER_SITE_OTHER): Payer: Medicare Other | Admitting: Orthopaedic Surgery

## 2020-03-21 DIAGNOSIS — Z96642 Presence of left artificial hip joint: Secondary | ICD-10-CM

## 2020-03-21 DIAGNOSIS — M25552 Pain in left hip: Secondary | ICD-10-CM | POA: Diagnosis not present

## 2020-03-21 DIAGNOSIS — M5442 Lumbago with sciatica, left side: Secondary | ICD-10-CM | POA: Diagnosis not present

## 2020-03-21 MED ORDER — GABAPENTIN 300 MG PO CAPS: 300.0000 mg | ORAL_CAPSULE | Freq: Every day | ORAL | 1 refills | Status: DC

## 2020-03-21 MED ORDER — METHYLPREDNISOLONE 4 MG PO TABS: ORAL_TABLET | ORAL | 0 refills | Status: DC

## 2020-03-21 NOTE — Progress Notes (Signed)
The patient is an 85 year old female who has a remote history of a left total hip arthroplasty done many years ago.  We had not seen her for a long period time and then in October 2020 she came in after having multiple falls.  She was found to have loosening of her acetabular component.  Then we recommended revision surgery but she did not show back up.  She stays at a independent living facility.  She ambulates with a rolling walker.  She still has a history of multiple falls.  Her pain today though is in the low back to the left side and not in her groin.  She ambulates very slowly.  She said the pain has been getting worse.  I did see a CT scan done of her abdomen pelvis and January 2021 showing severe degenerative changes at L4-L5 and L5-S1 with a significant spondylolisthesis at L4 on L5.  New x-rays of her pelvis and left hip today continue to show malposition of the entire component suggesting chronic loosening.  On exam she lets move her left hip to internal and external rotation easily.  Her leg lengths show that there is some shortening on the left comparing left and right.  Most of her pain today seems to be in the lower left SPECT of the lumbar spine and to the left side.  I will try a 6-day steroid taper since she is not a diabetic and this will hopefully treat some of her acute pain.  Also have her take 300 mg of Neurontin just at bedtime.  I would like to see her back in 2 weeks for AP and lateral of the lumbar spine.  Once again, I have recommended hip revision surgery for a left hip acetabular component and she is going to take this in consideration she states.  I explained in detail why I feel this would be appropriate to have this done considering comparison films from over a year ago showed the acetabulum but it is continue to be in a malposition which could lead to a dislocation or continued pain.  I will talk to her about this again in 2 weeks.  All questions and concerns were answered and  addressed.

## 2020-03-22 ENCOUNTER — Telehealth: Payer: Self-pay | Admitting: Orthopaedic Surgery

## 2020-03-22 NOTE — Telephone Encounter (Signed)
Pt didn't seem ready for surgery.

## 2020-03-22 NOTE — Telephone Encounter (Signed)
Patient called requesting a call back from Dr. Ninfa Linden. Patient states she has medical questions only Dr. Ninfa Linden can answer. Please call patient at [phone].

## 2020-03-22 NOTE — Telephone Encounter (Signed)
I called and talked to the pt. She stated she was a little confused after her appt. She was sure if dr. Ninfa Linden wanted her to have surgery in two weeks or f/u in the office with him. I informed her that Dr. Ninfa Linden wanted her to f/u with him in the office in two weeks to discuss treatment plan again. Pt stated understanding and I helped her make her f/u

## 2020-03-28 DIAGNOSIS — Z1159 Encounter for screening for other viral diseases: Secondary | ICD-10-CM | POA: Diagnosis not present

## 2020-03-28 DIAGNOSIS — Z20828 Contact with and (suspected) exposure to other viral communicable diseases: Secondary | ICD-10-CM | POA: Diagnosis not present

## 2020-03-29 ENCOUNTER — Telehealth: Payer: Self-pay

## 2020-03-29 NOTE — Telephone Encounter (Signed)
Pt called stating that the gabapentin that she is on is making her vision blurry and causing dizzy and it isn't helping for pain

## 2020-03-29 NOTE — Telephone Encounter (Signed)
She should just stop the gabapentin then. I'm not sure what else to recommend for her pain.

## 2020-03-30 NOTE — Telephone Encounter (Signed)
LMOM for patient of the below message and to call back with anymore questions

## 2020-04-04 ENCOUNTER — Encounter: Payer: Self-pay | Admitting: Orthopaedic Surgery

## 2020-04-04 ENCOUNTER — Ambulatory Visit (INDEPENDENT_AMBULATORY_CARE_PROVIDER_SITE_OTHER): Payer: Medicare Other | Admitting: Orthopaedic Surgery

## 2020-04-04 ENCOUNTER — Ambulatory Visit (INDEPENDENT_AMBULATORY_CARE_PROVIDER_SITE_OTHER): Payer: Medicare Other

## 2020-04-04 DIAGNOSIS — M5442 Lumbago with sciatica, left side: Secondary | ICD-10-CM

## 2020-04-04 DIAGNOSIS — Z96642 Presence of left artificial hip joint: Secondary | ICD-10-CM

## 2020-04-04 DIAGNOSIS — H04123 Dry eye syndrome of bilateral lacrimal glands: Secondary | ICD-10-CM | POA: Diagnosis not present

## 2020-04-04 DIAGNOSIS — H401112 Primary open-angle glaucoma, right eye, moderate stage: Secondary | ICD-10-CM | POA: Diagnosis not present

## 2020-04-04 DIAGNOSIS — M7062 Trochanteric bursitis, left hip: Secondary | ICD-10-CM | POA: Diagnosis not present

## 2020-04-04 DIAGNOSIS — H401123 Primary open-angle glaucoma, left eye, severe stage: Secondary | ICD-10-CM | POA: Diagnosis not present

## 2020-04-04 MED ORDER — METHYLPREDNISOLONE ACETATE 40 MG/ML IJ SUSP
40.0000 mg | INTRAMUSCULAR | Status: AC | PRN
  Administered 2020-04-04: 40 mg via INTRA_ARTICULAR

## 2020-04-04 MED ORDER — LIDOCAINE HCL 1 % IJ SOLN
3.0000 mL | INTRAMUSCULAR | Status: AC | PRN
  Administered 2020-04-04: 3 mL

## 2020-04-04 NOTE — Progress Notes (Signed)
Office Visit Note   Patient: [name]           Date of Birth: [birth year]           MRN: [id number] Visit Date: 04/04/2020              Requested by: [name], MD [place],   [id number] PCP: [name], MD   Assessment & Plan: Visit Diagnoses:  1. Acute left-sided low back pain with left-sided sciatica   2. Trochanteric bursitis, left hip   3. History of total hip arthroplasty, left     Plan: I did provide a steroid injection over the left hip trochanteric area.  I talked her about the possibility of hip revision surgery.  She is 9 years out from that left total hip arthroplasty and had done well through 2018 we last had x-rays.  All those from that I reviewed shows a normal-appearing acetabular component.  After that she had had multiple falls.  She has diffuse thinning of her bones as well.  I would like to see her back in 3 weeks to see how she is doing overall.  I would like a standing AP pelvis at that visit for comparison purposes as well.  Follow-Up Instructions: Return in about 3 weeks (around 04/25/2020).   Orders:  Orders Placed This Encounter  Procedures  . Large Joint Inj  . XR Lumbar Spine 2-3 Views   No orders of the defined types were placed in this encounter.     Procedures: Large Joint Inj: L greater trochanter on 04/04/2020 2:49 PM Indications: pain and diagnostic evaluation Details: 22 G 1.5 in needle, lateral approach  Arthrogram: No  Medications: 3 mL lidocaine 1 %; 40 mg methylPREDNISolone acetate 40 MG/ML Outcome: tolerated well, no immediate complications Procedure, treatment alternatives, risks and benefits explained, specific risks discussed. Consent was given by the patient. Immediately prior to procedure a time out was called to verify the correct patient, procedure, equipment, support staff and site/side marked as required. Patient was prepped and draped in the usual sterile fashion.       Clinical  Data: No additional findings.   Subjective: Chief Complaint  Patient presents with  . Spine - Pain  The patient is well-known to me.  Actually replaced her left hip back in 2013.  I saw her last in 2018 until more recently.  She had never had issues with her hip.  When I saw her a few weeks ago she came in with hip pain but also low back pain.  She ambulates with a rolling walker.  She is 85 years old.  At that visit I obtained x-rays of the pelvis and left hip that showed acetabular component had slowly moved in position.  She does wear a lift on that side and this helps.  Also her pain is in the low back but is also on the lateral aspect of her left hip.  She does ambulate again with a rolling walker.  HPI  Review of Systems She currently denies a headache, chest pain, shortness of breath, fever, chills, nausea, vomiting  Objective: Vital Signs: There were no vitals taken for this visit.  Physical Exam She is alert and orient x3 and in no acute distress Ortho Exam Examination with her laying supine shows that she is just slightly shorter on the left and the right is terms of leg lengths.  Her hip moves smoothly and fluidly with no pain in the groin at  all.  When I have her lay in a decubitus position her left hip has significant pain over the trochanteric area.  She also has significant low back pain at multiple areas throughout the lumbar spine. Specialty Comments:  No specialty comments available.  Imaging: XR Lumbar Spine 2-3 Views  Result Date: 04/04/2020 2 views of the lumbar spine shows severe degenerative changes at multiple levels.  There is a grade 1 to grade 2 spondylolisthesis at L4 on L5.  There is complete loss of the disc space at that level as well.    PMFS History: Patient Active Problem List   Diagnosis Date Noted  . History of total hip arthroplasty, left 11/12/2018  . Closed fracture of left distal radius 11/09/2016  . Neck pain 08/20/2016  . Pain in left hip  08/20/2016  . History of left hip replacement 08/20/2016  . OA (osteoarthritis) of knee 03/14/2015  . Diastolic dysfunction 77/82/4235  . LBBB (left bundle branch block) 07/19/2013  . Obesity (BMI 30-39.9) 11/11/2012  . Chronic diarrhea   . Primary open angle glaucoma of left eye, severe stage 10/03/2012  . Hypertension   . OAB (overactive bladder)   . Depression   . Osteopenia   . Symptoms referable to back 01/07/2012  . Cervical spondylosis 01/07/2012  . Degenerative arthritis of hip 06/01/2011  . Cataract 11/17/2010  . Glaucoma 11/17/2010   Past Medical History:  Diagnosis Date  . Arthritis   . Bilateral edema of lower extremity   . CKD (chronic kidney disease)   . Constipation   . Degenerative arthritis of hip    s/p THR 05/2011  . Depression   . Glaucoma   . Headache(784.0)   . HLD (hyperlipidemia)   . Hypertension   . OAB (overactive bladder)   . Osteoporosis   . Primary osteoarthritis of right knee   . Slow transit constipation   . Unsteady gait   . Venous insufficiency     Family History  Problem Relation Age of Onset  . Ovarian cancer Mother   . Alcohol abuse Father   . Emphysema Father   . Stroke Brother     Past Surgical History:  Procedure Laterality Date  .  cataracts removed  2013 L  . ABDOMINAL HYSTERECTOMY  2003  . BREAST BIOPSY  1960  . BREAST CYSTS     X2 BENIGN  . BREAST EXCISIONAL BIOPSY Left   . BREAST SURGERY    . ENDOVENOUS ABLATION SAPHENOUS VEIN W/ LASER Left 06/04/2019   endovenous laser ablation left greater saphenous vein by Gae Gallop MD   . [place] Bilateral 2013, 2014   bond ([facility])  . REDUCTION MAMMAPLASTY Bilateral 2000  . TOTAL HIP ARTHROPLASTY  06/01/2011   Procedure: TOTAL HIP ARTHROPLASTY ANTERIOR APPROACH;  Surgeon: Mcarthur Rossetti, MD;  Location: [facility];  Service: Orthopedics;  Laterality: Left;  Left Total Hip Replacement, Direct Anterior Approach  . TOTAL KNEE ARTHROPLASTY Right 03/14/2015    Procedure: TOTAL KNEE ARTHROPLASTY;  Surgeon: Gaynelle Arabian, MD;  Location: [facility];  Service: Orthopedics;  Laterality: Right;   Social History   Occupational History  . Not on file  Tobacco Use  . Smoking status: Former Smoker    Packs/day: 1.00    Years: 35.00    Pack years: 35.00    Types: Cigarettes    Quit date: 05/27/1980    Years since quitting: 39.8  . Smokeless tobacco: Never Used  Vaping Use  . Vaping Use: Never  used  Substance and Sexual Activity  . Alcohol use: No    Alcohol/week: 0.0 standard drinks  . Drug use: No  . Sexual activity: Not on file

## 2020-04-05 ENCOUNTER — Other Ambulatory Visit: Payer: Self-pay

## 2020-04-05 ENCOUNTER — Emergency Department (HOSPITAL_COMMUNITY): Payer: Medicare Other

## 2020-04-05 ENCOUNTER — Emergency Department (HOSPITAL_COMMUNITY)
Admission: EM | Admit: 2020-04-05 | Discharge: 2020-04-05 | Disposition: A | Discharge status: Home / Self Care | Payer: Medicare Other | Attending: Emergency Medicine | Admitting: Emergency Medicine

## 2020-04-05 DIAGNOSIS — Z79899 Other long term (current) drug therapy: Secondary | ICD-10-CM | POA: Insufficient documentation

## 2020-04-05 DIAGNOSIS — I129 Hypertensive chronic kidney disease with stage 1 through stage 4 chronic kidney disease, or unspecified chronic kidney disease: Secondary | ICD-10-CM | POA: Insufficient documentation

## 2020-04-05 DIAGNOSIS — R079 Chest pain, unspecified: Secondary | ICD-10-CM | POA: Diagnosis not present

## 2020-04-05 DIAGNOSIS — Z96643 Presence of artificial hip joint, bilateral: Secondary | ICD-10-CM | POA: Diagnosis not present

## 2020-04-05 DIAGNOSIS — N189 Chronic kidney disease, unspecified: Secondary | ICD-10-CM | POA: Insufficient documentation

## 2020-04-05 DIAGNOSIS — Z96651 Presence of right artificial knee joint: Secondary | ICD-10-CM | POA: Insufficient documentation

## 2020-04-05 DIAGNOSIS — R0602 Shortness of breath: Secondary | ICD-10-CM | POA: Insufficient documentation

## 2020-04-05 DIAGNOSIS — R0789 Other chest pain: Secondary | ICD-10-CM

## 2020-04-05 DIAGNOSIS — J9811 Atelectasis: Secondary | ICD-10-CM | POA: Diagnosis not present

## 2020-04-05 DIAGNOSIS — R609 Edema, unspecified: Secondary | ICD-10-CM | POA: Diagnosis not present

## 2020-04-05 DIAGNOSIS — Z87891 Personal history of nicotine dependence: Secondary | ICD-10-CM | POA: Diagnosis not present

## 2020-04-05 LAB — CBC
HCT: 37.5 % (ref 36.0–46.0)
Hemoglobin: 12.7 g/dL (ref 12.0–15.0)
MCH: 32.4 pg (ref 26.0–34.0)
MCHC: 33.9 g/dL (ref 30.0–36.0)
MCV: 95.7 fL (ref 80.0–100.0)
Platelets: 195 10*3/uL (ref 150–400)
RBC: 3.92 MIL/uL (ref 3.87–5.11)
RDW: 12.2 % (ref 11.5–15.5)
WBC: 11.7 10*3/uL — ABNORMAL HIGH (ref 4.0–10.5)
nRBC: 0 % (ref 0.0–0.2)

## 2020-04-05 LAB — BASIC METABOLIC PANEL
Anion gap: 8 (ref 5–15)
BUN: 26 mg/dL — ABNORMAL HIGH (ref 8–23)
CO2: 19 mmol/L — ABNORMAL LOW (ref 22–32)
Calcium: 9.5 mg/dL (ref 8.9–10.3)
Chloride: 105 mmol/L (ref 98–111)
Creatinine, Ser: 1.12 mg/dL — ABNORMAL HIGH (ref 0.44–1.00)
GFR, Estimated: 49 mL/min — ABNORMAL LOW (ref >60)
Glucose, Bld: 117 mg/dL — ABNORMAL HIGH (ref 70–99)
Potassium: 4.5 mmol/L (ref 3.5–5.1)
Sodium: 132 mmol/L — ABNORMAL LOW (ref 135–145)

## 2020-04-05 LAB — TROPONIN I (HIGH SENSITIVITY)
Troponin I (High Sensitivity): 7 ng/L (ref <18)
Troponin I (High Sensitivity): 9 ng/L (ref <18)

## 2020-04-05 LAB — HEPATIC FUNCTION PANEL
ALT: 19 U/L (ref 0–44)
AST: 21 U/L (ref 15–41)
Albumin: 3.8 g/dL (ref 3.5–5.0)
Alkaline Phosphatase: 94 U/L (ref 38–126)
Bilirubin, Direct: 0.1 mg/dL (ref 0.0–0.2)
Indirect Bilirubin: 0.7 mg/dL (ref 0.3–0.9)
Total Bilirubin: 0.8 mg/dL (ref 0.3–1.2)
Total Protein: 6.8 g/dL (ref 6.5–8.1)

## 2020-04-05 LAB — LIPASE, BLOOD: Lipase: 32 U/L (ref 11–51)

## 2020-04-05 NOTE — ED Triage Notes (Signed)
Pt reports sudden onset of chest pain x30 mins ago. Pt denies any sob.Pt reports she was just sitting down when it started.

## 2020-04-05 NOTE — ED Provider Notes (Signed)
[name] Provider Note   CSN: [id number] Arrival date & time: 04/05/20  1431     History Chief Complaint  Patient presents with  . Chest Pain    Page [name] is a 85 y.o. female.  The history is provided by the patient and medical records.  Chest Pain  [name] is a 85 y.o. female who presents to the Emergency [facility] complaining of chest pain. She presents the emergency [facility] for evaluation of central lower chest pain at that started about 45 minutes prior to ED arrival. She states that this occurred while she was having her nails done. Discomfort is described as a tightness with associated shortness of breath. The pain is nonradiating. Overall her symptoms are significantly improved to nearly resolved. No prior similar symptoms. She denies any fevers, cough, diaphoresis, nausea, vomiting. She has chronic lower extremity edema and this is at her baseline. She does have a history of hypertension. She did have a joint injection yesterday by orthopedics for chronic low back pain and received steroids at that time.  She has no known cardiac history. She has a history of oophorectomy. She has hypertension. She is a non-smoker. No family history of coronary artery disease. No history of DVT/PE.    Past Medical History:  Diagnosis Date  . Arthritis   . Bilateral edema of lower extremity   . CKD (chronic kidney disease)   . Constipation   . Degenerative arthritis of hip    s/p THR 05/2011  . Depression   . Glaucoma   . Headache(784.0)   . HLD (hyperlipidemia)   . Hypertension   . OAB (overactive bladder)   . Osteoporosis   . Primary osteoarthritis of right knee   . Slow transit constipation   . Unsteady gait   . Venous insufficiency     Patient Active Problem List   Diagnosis Date Noted  . History of total hip arthroplasty, left 11/12/2018  . Closed fracture of left distal radius 11/09/2016  . Neck pain 08/20/2016  . Pain in  left hip 08/20/2016  . History of left hip replacement 08/20/2016  . OA (osteoarthritis) of knee 03/14/2015  . Diastolic dysfunction 56/38/7564  . LBBB (left bundle branch block) 07/19/2013  . Obesity (BMI 30-39.9) 11/11/2012  . Chronic diarrhea   . Primary open angle glaucoma of left eye, severe stage 10/03/2012  . Hypertension   . OAB (overactive bladder)   . Depression   . Osteopenia   . Symptoms referable to back 01/07/2012  . Cervical spondylosis 01/07/2012  . Degenerative arthritis of hip 06/01/2011  . Cataract 11/17/2010  . Glaucoma 11/17/2010    Past Surgical History:  Procedure Laterality Date  .  cataracts removed  2013 L  . ABDOMINAL HYSTERECTOMY  2003  . BREAST BIOPSY  1960  . BREAST CYSTS     X2 BENIGN  . BREAST EXCISIONAL BIOPSY Left   . BREAST SURGERY    . ENDOVENOUS ABLATION SAPHENOUS VEIN W/ LASER Left 06/04/2019   endovenous laser ablation left greater saphenous vein by Gae Gallop MD   . [place] Bilateral 2013, 2014   bond ([facility])  . REDUCTION MAMMAPLASTY Bilateral 2000  . TOTAL HIP ARTHROPLASTY  06/01/2011   Procedure: TOTAL HIP ARTHROPLASTY ANTERIOR APPROACH;  Surgeon: Mcarthur Rossetti, MD;  Location: [facility];  Service: Orthopedics;  Laterality: Left;  Left Total Hip Replacement, Direct Anterior Approach  . TOTAL KNEE ARTHROPLASTY Right 03/14/2015   Procedure: TOTAL KNEE  ARTHROPLASTY;  Surgeon: Gaynelle Arabian, MD;  Location: [facility];  Service: Orthopedics;  Laterality: Right;     OB History   No obstetric history on file.     Family History  Problem Relation Age of Onset  . Ovarian cancer Mother   . Alcohol abuse Father   . Emphysema Father   . Stroke Brother     Social History   Tobacco Use  . Smoking status: Former Smoker    Packs/day: 1.00    Years: 35.00    Pack years: 35.00    Types: Cigarettes    Quit date: 05/27/1980    Years since quitting: 39.8  . Smokeless tobacco: Never Used  Vaping Use  . Vaping Use: Never  used  Substance Use Topics  . Alcohol use: No    Alcohol/week: 0.0 standard drinks  . Drug use: No    Home Medications Prior to Admission medications   Medication Sig Start Date End Date Taking? Authorizing Provider  brimonidine (ALPHAGAN) 0.2 % ophthalmic solution Place 1 drop into the left eye 3 (three) times daily.   Yes [provider]  carvedilol (COREG) 25 MG tablet Take 25 mg by mouth 2 (two) times daily. 08/17/16  Yes [provider]  dorzolamidel-timolol (COSOPT) 22.3-6.8 MG/ML SOLN ophthalmic solution Place 1 drop into both eyes 2 (two) times daily.   Yes [provider]  furosemide (LASIX) 40 MG tablet Take 40 mg by mouth daily. 09/20/16  Yes [provider]  latanoprost (XALATAN) 0.005 % ophthalmic solution Place 1 drop into the left eye nightly. 05/04/19  Yes [provider]  lisinopril (PRINIVIL,ZESTRIL) 30 MG tablet take 1 tablet by mouth once daily 09/07/13  Yes Rowe Clack, MD  timolol (TIMOPTIC) 0.5 % ophthalmic solution Place 1 drop into both eyes 2 (two) times daily. 10/30/16  Yes [provider]  ANORO ELLIPTA 62.5-25 MCG/INH AEPB Inhale 1 puff into the lungs daily as needed (copd). 04/27/19   [provider]  gabapentin (NEURONTIN) 300 MG capsule Take 1 capsule (300 mg total) by mouth at bedtime. Patient not taking: No sig reported 03/21/20   Mcarthur Rossetti, MD    Allergies    Patient has no known allergies.  Review of Systems   Review of Systems  Cardiovascular: Positive for chest pain.  All other systems reviewed and are negative.   Physical Exam Updated Vital Signs BP (!) 159/63   Pulse 66   Temp 98.3 F (36.8 C)   Resp (!) 21   SpO2 95%   Physical Exam Vitals and nursing note reviewed.  Constitutional:      Appearance: She is well-developed and well-nourished.  HENT:     Head: Normocephalic and atraumatic.  Cardiovascular:     Rate and Rhythm: Normal rate and regular  rhythm.     Heart sounds: No murmur heard.   Pulmonary:     Effort: Pulmonary effort is normal. No respiratory distress.     Breath sounds: Normal breath sounds.  Abdominal:     Palpations: Abdomen is soft.     Tenderness: There is no abdominal tenderness. There is no guarding or rebound.  Musculoskeletal:        General: No tenderness or edema.     Comments: 2+ pitting edema to bilateral lower extremities  Skin:    General: Skin is warm and dry.  Neurological:     Mental Status: She is alert and oriented to person, place, and time.  Psychiatric:  Mood and Affect: Mood and affect normal.        Behavior: Behavior normal.     ED Results / Procedures / Treatments   Labs (all labs ordered are listed, but only abnormal results are displayed) Labs Reviewed  BASIC METABOLIC PANEL - Abnormal; Notable for the following components:      Result Value   Sodium 132 (*)    CO2 19 (*)    Glucose, Bld 117 (*)    BUN 26 (*)    Creatinine, Ser 1.12 (*)    GFR, Estimated 49 (*)    All other components within normal limits  CBC - Abnormal; Notable for the following components:   WBC 11.7 (*)    All other components within normal limits  HEPATIC FUNCTION PANEL  LIPASE, BLOOD  TROPONIN I (HIGH SENSITIVITY)  TROPONIN I (HIGH SENSITIVITY)    EKG EKG Interpretation  Date/Time:  Tuesday April 05 2020 14:36:53 EST Ventricular Rate:  67 PR Interval:  226 QRS Duration: 152 QT Interval:  486 QTC Calculation: 513 R Axis:   -12 Text Interpretation: Sinus rhythm with sinus arrhythmia with 1st degree A-V block Left bundle branch block Abnormal ECG Confirmed by Quintella Reichert [phone]) on 04/05/2020 2:44:11 PM   Radiology DG Chest 2 View  Result Date: 04/05/2020 CLINICAL DATA:  Chest pain EXAM: CHEST - 2 VIEW COMPARISON:  11/02/2016 FINDINGS: Mild left basilar atelectasis. Right lung is clear. No pleural effusion or pneumothorax. The heart is normal in size.  Thoracic aortic  atherosclerosis. Mild lower thoracic vertebral wedging, chronic. IMPRESSION: No evidence of acute cardiopulmonary disease. Electronically Signed   By: Julian Hy M.D.   On: 04/05/2020 15:30   XR Lumbar Spine 2-3 Views  Result Date: 04/04/2020 2 views of the lumbar spine shows severe degenerative changes at multiple levels.  There is a grade 1 to grade 2 spondylolisthesis at L4 on L5.  There is complete loss of the disc space at that level as well.   Procedures Procedures   Medications Ordered in ED Medications - No data to display  ED Course  I have reviewed the triage vital signs and the nursing notes.  Pertinent labs & imaging results that were available during my care of the patient were reviewed by me and considered in my medical decision making (see chart for details).    MDM Rules/Calculators/A&P                         patient with history of hypertension, hyperlipidemia here for evaluation of one episode of chest pain earlier today. She is pain free on ED presentation. EKG with left bundle branch block. No prior EKG in the system with a similar pattern but on cardiology notes that she does have a history of left bundle branch block. Troponin is negative times two. On reassessment she continues to be asymptomatic. She did have a steroid joint injection yesterday, question if there is some degree of associated reflux. Presentation is not consistent with ACS, PE, dissection. Discussed with patient home care for chest pain. Discussed cardiology follow-up as well as return precautions.  Final Clinical Impression(s) / ED Diagnoses Final diagnoses:  Atypical chest pain    Rx / DC Orders ED Discharge Orders    None       Quintella Reichert, MD 04/05/20 2159

## 2020-04-05 NOTE — ED Notes (Signed)
Pt to xray

## 2020-04-08 DIAGNOSIS — R5382 Chronic fatigue, unspecified: Secondary | ICD-10-CM | POA: Diagnosis not present

## 2020-04-08 DIAGNOSIS — R635 Abnormal weight gain: Secondary | ICD-10-CM | POA: Diagnosis not present

## 2020-04-08 DIAGNOSIS — I10 Essential (primary) hypertension: Secondary | ICD-10-CM | POA: Diagnosis not present

## 2020-04-08 DIAGNOSIS — N951 Menopausal and female climacteric states: Secondary | ICD-10-CM | POA: Diagnosis not present

## 2020-04-11 DIAGNOSIS — Z1159 Encounter for screening for other viral diseases: Secondary | ICD-10-CM | POA: Diagnosis not present

## 2020-04-11 DIAGNOSIS — Z20828 Contact with and (suspected) exposure to other viral communicable diseases: Secondary | ICD-10-CM | POA: Diagnosis not present

## 2020-04-12 ENCOUNTER — Telehealth: Payer: Self-pay

## 2020-04-12 DIAGNOSIS — M25552 Pain in left hip: Secondary | ICD-10-CM

## 2020-04-12 NOTE — Telephone Encounter (Signed)
Patient called she is requesting a referral to [place] office call [phone]

## 2020-04-12 NOTE — Telephone Encounter (Signed)
Ok for referral?

## 2020-04-12 NOTE — Telephone Encounter (Signed)
OK 

## 2020-04-13 NOTE — Addendum Note (Signed)
Addended by: Robyne Peers on: 04/13/2020 08:35 AM   Modules accepted: Orders

## 2020-04-13 NOTE — Telephone Encounter (Signed)
Referral placed in chart. Pt was informed

## 2020-04-25 ENCOUNTER — Ambulatory Visit (INDEPENDENT_AMBULATORY_CARE_PROVIDER_SITE_OTHER): Payer: Medicare Other | Admitting: Orthopaedic Surgery

## 2020-04-25 ENCOUNTER — Encounter: Payer: Self-pay | Admitting: Orthopaedic Surgery

## 2020-04-25 ENCOUNTER — Ambulatory Visit (INDEPENDENT_AMBULATORY_CARE_PROVIDER_SITE_OTHER): Payer: Medicare Other

## 2020-04-25 DIAGNOSIS — Z20828 Contact with and (suspected) exposure to other viral communicable diseases: Secondary | ICD-10-CM | POA: Diagnosis not present

## 2020-04-25 DIAGNOSIS — Z96642 Presence of left artificial hip joint: Secondary | ICD-10-CM | POA: Diagnosis not present

## 2020-04-25 DIAGNOSIS — Z1159 Encounter for screening for other viral diseases: Secondary | ICD-10-CM | POA: Diagnosis not present

## 2020-04-25 MED ORDER — DOXYCYCLINE HYCLATE 100 MG PO TABS: 100.0000 mg | ORAL_TABLET | Freq: Two times a day (BID) | ORAL | 0 refills | Status: DC

## 2020-04-25 NOTE — Progress Notes (Signed)
The patient is an 85 year old female well-known to me.  We actually replaced her left hip about 9 years ago.  She does ambulate with a rolling walker and has had multiple falls over the years.  Over time her as to have a component went into a more vertical position.  Is not changed for the last few years and I did obtain x-rays today to show that it is not a change in position.  She says the hip does not hurt her at all.  I am concerned about the vertical position and the possible dislocation however she has been in this position for many years now.  X-rays again today show that the position of this has not changed.  I talked her about her hip revision surgery and she is deferring this for now.  She did point to some chronic cellulitis that she has on her left leg.  She does wear compressive hose.  She says this is been a chronic issue for her.  Her left hip does move smoothly and fluidly.  She does note that her back pain is improved dramatically with gabapentin.  Her left leg does show some warmth down near the mid shin area consistent with a chronic cellulitis.  I will start her on doxycycline for chronic cellulitis and recommend she continue her compressive garments.  She should follow-up with her primary care physician as well.  From a hip standpoint, she will continue her activities as she tolerates with the idea that she is to be careful with that hip.  I would like to see her back in 6 months for repeat standing AP pelvis.  If things worsen certainly we can perform a hip acetabular revision if needed.

## 2020-05-09 DIAGNOSIS — H401112 Primary open-angle glaucoma, right eye, moderate stage: Secondary | ICD-10-CM | POA: Diagnosis not present

## 2020-05-09 DIAGNOSIS — H401123 Primary open-angle glaucoma, left eye, severe stage: Secondary | ICD-10-CM | POA: Diagnosis not present

## 2020-05-11 DIAGNOSIS — H52202 Unspecified astigmatism, left eye: Secondary | ICD-10-CM | POA: Diagnosis not present

## 2020-05-11 DIAGNOSIS — H401133 Primary open-angle glaucoma, bilateral, severe stage: Secondary | ICD-10-CM | POA: Diagnosis not present

## 2020-05-11 DIAGNOSIS — H43811 Vitreous degeneration, right eye: Secondary | ICD-10-CM | POA: Diagnosis not present

## 2020-05-11 DIAGNOSIS — Z961 Presence of intraocular lens: Secondary | ICD-10-CM | POA: Diagnosis not present

## 2020-05-16 DIAGNOSIS — Z1159 Encounter for screening for other viral diseases: Secondary | ICD-10-CM | POA: Diagnosis not present

## 2020-05-16 DIAGNOSIS — Z20828 Contact with and (suspected) exposure to other viral communicable diseases: Secondary | ICD-10-CM | POA: Diagnosis not present

## 2020-05-30 DIAGNOSIS — Z1159 Encounter for screening for other viral diseases: Secondary | ICD-10-CM | POA: Diagnosis not present

## 2020-05-30 DIAGNOSIS — Z20828 Contact with and (suspected) exposure to other viral communicable diseases: Secondary | ICD-10-CM | POA: Diagnosis not present

## 2020-06-02 ENCOUNTER — Ambulatory Visit (INDEPENDENT_AMBULATORY_CARE_PROVIDER_SITE_OTHER): Payer: Medicare Other | Admitting: Podiatry

## 2020-06-02 ENCOUNTER — Other Ambulatory Visit: Payer: Self-pay

## 2020-06-02 ENCOUNTER — Ambulatory Visit: Payer: Medicare Other

## 2020-06-02 DIAGNOSIS — M2042 Other hammer toe(s) (acquired), left foot: Secondary | ICD-10-CM

## 2020-06-02 DIAGNOSIS — M2041 Other hammer toe(s) (acquired), right foot: Secondary | ICD-10-CM

## 2020-06-02 DIAGNOSIS — L84 Corns and callosities: Secondary | ICD-10-CM

## 2020-06-06 NOTE — Progress Notes (Signed)
Subjective:   Patient ID: [name], female   DOB: 85 y.o.   MRN: [id number]   HPI Patient presents stating she has digital deformities on both feet and they get sore in shoes and also she has lesions which are bothersome for her and she tries to trim herself without relief of symptoms.  States this is been ongoing   ROS      Objective:  Physical Exam  Neurovascular status intact with patient found to have digital deformities bilateral with elevation of the lesser digits for and has keratotic lesions which form on the lesser digits both fourth and fifth toes which can become painful when palpated     Assessment:  Chronic structural deformity bilateral with digital deformities keratotic lesions partially related to this and also just due to generalized weightbearing     Plan:  H&P reviewed both conditions discussed the possibility of arthroplasty in the future and I did discuss and explained this to her.  Today I went ahead I debrided lesions bilateral no iatrogenic bleeding and I advised on supportive shoes and mesh materials.  Reappoint to recheck

## 2020-06-08 DIAGNOSIS — N1832 Chronic kidney disease, stage 3b: Secondary | ICD-10-CM | POA: Diagnosis not present

## 2020-06-08 DIAGNOSIS — D631 Anemia in chronic kidney disease: Secondary | ICD-10-CM | POA: Diagnosis not present

## 2020-06-08 DIAGNOSIS — N189 Chronic kidney disease, unspecified: Secondary | ICD-10-CM | POA: Diagnosis not present

## 2020-06-08 DIAGNOSIS — N2581 Secondary hyperparathyroidism of renal origin: Secondary | ICD-10-CM | POA: Diagnosis not present

## 2020-06-17 DIAGNOSIS — M2041 Other hammer toe(s) (acquired), right foot: Secondary | ICD-10-CM | POA: Diagnosis not present

## 2020-06-17 DIAGNOSIS — L600 Ingrowing nail: Secondary | ICD-10-CM | POA: Diagnosis not present

## 2020-06-17 DIAGNOSIS — M2042 Other hammer toe(s) (acquired), left foot: Secondary | ICD-10-CM | POA: Diagnosis not present

## 2020-06-21 ENCOUNTER — Telehealth: Payer: Self-pay | Admitting: Physician Assistant

## 2020-06-21 NOTE — Telephone Encounter (Signed)
Received a new pt referral from Dr. Justin Mend for slight pos m spike. Ms. [name] returned my call and has been scheduled to see [name] on 5/25 at 2pm. Aware to arrive 20 minutes early.

## 2020-06-22 ENCOUNTER — Other Ambulatory Visit: Payer: Self-pay

## 2020-06-22 ENCOUNTER — Inpatient Hospital Stay: Payer: Medicare Other | Attending: Physician Assistant | Admitting: Physician Assistant

## 2020-06-22 ENCOUNTER — Inpatient Hospital Stay: Payer: Medicare Other

## 2020-06-22 ENCOUNTER — Encounter: Payer: Self-pay | Admitting: Physician Assistant

## 2020-06-22 VITALS — BP 128/100 | HR 77 | Temp 97.5°F | Resp 18 | Wt 196.6 lb

## 2020-06-22 DIAGNOSIS — R5382 Chronic fatigue, unspecified: Secondary | ICD-10-CM | POA: Diagnosis not present

## 2020-06-22 DIAGNOSIS — M810 Age-related osteoporosis without current pathological fracture: Secondary | ICD-10-CM | POA: Insufficient documentation

## 2020-06-22 DIAGNOSIS — Z87891 Personal history of nicotine dependence: Secondary | ICD-10-CM | POA: Diagnosis not present

## 2020-06-22 DIAGNOSIS — D472 Monoclonal gammopathy: Secondary | ICD-10-CM | POA: Insufficient documentation

## 2020-06-22 DIAGNOSIS — N189 Chronic kidney disease, unspecified: Secondary | ICD-10-CM | POA: Insufficient documentation

## 2020-06-22 LAB — CMP (CANCER CENTER ONLY)
ALT: 15 U/L (ref 0–44)
AST: 20 U/L (ref 15–41)
Albumin: 3.8 g/dL (ref 3.5–5.0)
Alkaline Phosphatase: 101 U/L (ref 38–126)
Anion gap: 8 (ref 5–15)
BUN: 16 mg/dL (ref 8–23)
CO2: 25 mmol/L (ref 22–32)
Calcium: 9.1 mg/dL (ref 8.9–10.3)
Chloride: 108 mmol/L (ref 98–111)
Creatinine: 1.05 mg/dL — ABNORMAL HIGH (ref 0.44–1.00)
GFR, Estimated: 53 mL/min — ABNORMAL LOW (ref >60)
Glucose, Bld: 81 mg/dL (ref 70–99)
Potassium: 4.1 mmol/L (ref 3.5–5.1)
Sodium: 141 mmol/L (ref 135–145)
Total Bilirubin: 0.6 mg/dL (ref 0.3–1.2)
Total Protein: 7.1 g/dL (ref 6.5–8.1)

## 2020-06-22 LAB — CBC WITH DIFFERENTIAL (CANCER CENTER ONLY)
Abs Immature Granulocytes: 0.02 10*3/uL (ref 0.00–0.07)
Basophils Absolute: 0 10*3/uL (ref 0.0–0.1)
Basophils Relative: 1 %
Eosinophils Absolute: 0.2 10*3/uL (ref 0.0–0.5)
Eosinophils Relative: 5 %
HCT: 37.6 % (ref 36.0–46.0)
Hemoglobin: 13.2 g/dL (ref 12.0–15.0)
Immature Granulocytes: 1 %
Lymphocytes Relative: 29 %
Lymphs Abs: 1.2 10*3/uL (ref 0.7–4.0)
MCH: 32.9 pg (ref 26.0–34.0)
MCHC: 35.1 g/dL (ref 30.0–36.0)
MCV: 93.8 fL (ref 80.0–100.0)
Monocytes Absolute: 0.5 10*3/uL (ref 0.1–1.0)
Monocytes Relative: 12 %
Neutro Abs: 2.3 10*3/uL (ref 1.7–7.7)
Neutrophils Relative %: 52 %
Platelet Count: 179 10*3/uL (ref 150–400)
RBC: 4.01 MIL/uL (ref 3.87–5.11)
RDW: 12.4 % (ref 11.5–15.5)
WBC Count: 4.3 10*3/uL (ref 4.0–10.5)
nRBC: 0 % (ref 0.0–0.2)

## 2020-06-22 LAB — LACTATE DEHYDROGENASE: LDH: 174 U/L (ref 98–192)

## 2020-06-22 NOTE — Progress Notes (Signed)
[place] [phone]   Fax:([phone]) [place] NOTE  Patient Care Team: [name], MD as PCP - General (Internal Medicine) Laurence Spates, MD (Inactive) (Gastroenterology) Mcarthur Rossetti, MD (Orthopedic Surgery) Erline Levine, MD (Neurosurgery) Teofilo Pod, MD (Ophthalmology) Petrinitz, Dellis Filbert, DPM (Inactive) (Podiatry) Nunzio Cobbs, MD (Obstetrics and Gynecology) Rolm Bookbinder, MD (Dermatology) Shon Hough, MD (Ophthalmology)  Hematological/Oncological History 1) 06/07/2020: Labs from [place] revealed m-protein 0.2. 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  2) 06/22/2020: Establish care with [name]  CHIEF COMPLAINTS/PURPOSE OF CONSULTATION:  "MGUS"  HISTORY OF PRESENTING ILLNESS:  Amil Moseman 85 y.o. female with medical history significant for CKD, degenerative arthritis, hyperlipidemia, hypertension, osteoporosis, venous insufficiency and glaucoma. Patient is unaccompanied for this visit.   On exam today, Ms. Weipert reports chronic fatigue that has been present for several years. She is able to complete her daily activities on her but would like to be more active. She has a good appetite and reports weight gain. She is frustrated with her weight gain and increased abdominal girth. Patient notes nausea for the past few days that resolves with taking OTC Pepto. She denies any vomiting episodes or abdominal pain. Patient has regular bowel movements without any constipation or diarrhea. She denies easy bruising or signs of bleeding. Patient has lower extremity edema, left greater than right. She has numbness and tingling in her fingers and feet, that doesn't interfere with her balance or grip. Patient denies any fevers, chills, night sweats, shortness of breath, chest pain or cough. She has no other complaints. Rest of the 10 point ROS is below.     MEDICAL HISTORY:  Past Medical History:  Diagnosis Date  . Arthritis   . Bilateral edema of lower extremity   . CKD (chronic kidney disease)   . Constipation   . Degenerative arthritis of hip    s/p THR 05/2011  . Depression   . Glaucoma   . Headache(784.0)   . HLD (hyperlipidemia)   . Hypertension   . OAB (overactive bladder)   . Osteoporosis   . Primary osteoarthritis of right knee   . Slow transit constipation   . Unsteady gait   . Venous insufficiency     SURGICAL HISTORY: Past Surgical History:  Procedure Laterality Date  .  cataracts removed  2013 L  . ABDOMINAL HYSTERECTOMY  2003  . BREAST BIOPSY  1960  . BREAST CYSTS     X2 BENIGN  . BREAST EXCISIONAL BIOPSY Left   . BREAST SURGERY    . ENDOVENOUS ABLATION SAPHENOUS VEIN W/ LASER Left 06/04/2019   endovenous laser ablation left greater saphenous vein by Gae Gallop MD   . [place] Bilateral 2013, 2014   bond ([facility])  . REDUCTION MAMMAPLASTY Bilateral 2000  . TOTAL HIP ARTHROPLASTY  06/01/2011   Procedure: TOTAL HIP ARTHROPLASTY ANTERIOR APPROACH;  Surgeon: Mcarthur Rossetti, MD;  Location: [facility];  Service: Orthopedics;  Laterality: Left;  Left Total Hip Replacement, Direct Anterior Approach  . TOTAL KNEE ARTHROPLASTY Right 03/14/2015   Procedure: TOTAL KNEE ARTHROPLASTY;  Surgeon: Gaynelle Arabian, MD;  Location: [facility];  Service: Orthopedics;  Laterality: Right;    SOCIAL HISTORY: Social History   Socioeconomic History  . Marital status: Divorced    Spouse name: Not on file  . Number of children: Not on file  . Years of education: Not on file  . Highest  education level: Not on file  Occupational History  . Not on file  Tobacco Use  . Smoking status: Former Smoker    Packs/day: 1.00    Years: 35.00    Pack years: 35.00    Types: Cigarettes    Quit date: 05/27/1980    Years since quitting: 40.1  . Smokeless tobacco: Never Used  Vaping Use  . Vaping Use: Never used  Substance and  Sexual Activity  . Alcohol use: No    Alcohol/week: 0.0 standard drinks  . Drug use: No  . Sexual activity: Not on file  Other Topics Concern  . Not on file  Social History Narrative   Lives alone in [place] at [place].  One child, [name].  Education: college.   Social Determinants of Health   Financial Resource Strain: Not on file  Food Insecurity: Not on file  Transportation Needs: Not on file  Physical Activity: Not on file  Stress: Not on file  Social Connections: Not on file  Intimate Partner Violence: Not on file    FAMILY HISTORY: Family History  Problem Relation Age of Onset  . Cancer Mother   . Alcohol abuse Father   . Emphysema Father   . Stroke Brother   . Melanoma Brother     ALLERGIES:  has No Known Allergies.  MEDICATIONS:  Current Outpatient Medications  Medication Sig Dispense Refill  . gabapentin (NEURONTIN) 300 MG capsule Take 1 capsule (300 mg total) by mouth at bedtime. 30 capsule 1  . ANORO ELLIPTA 62.5-25 MCG/INH AEPB Inhale 1 puff into the lungs daily as needed (copd).    . brimonidine (ALPHAGAN) 0.2 % ophthalmic solution Place 1 drop into the left eye 3 (three) times daily.    . carvedilol (COREG) 25 MG tablet Take 25 mg by mouth 2 (two) times daily.    . dorzolamidel-timolol (COSOPT) 22.3-6.8 MG/ML SOLN ophthalmic solution Place 1 drop into both eyes 2 (two) times daily.    . furosemide (LASIX) 40 MG tablet Take 40 mg by mouth daily.    [name] latanoprost (XALATAN) 0.005 % ophthalmic solution Place 1 drop into the left eye nightly.    [name] lisinopril (PRINIVIL,ZESTRIL) 30 MG tablet take 1 tablet by mouth once daily 90 tablet 1  . timolol (TIMOPTIC) 0.5 % ophthalmic solution Place 1 drop into both eyes 2 (two) times daily.     No current facility-administered medications for this visit.    REVIEW OF SYSTEMS:   Constitutional: ( - ) fevers, ( - )  chills , ( - ) night sweats Eyes: ( - ) blurriness of vision, ( - ) double vision, ( - ) watery eyes Ears,  nose, mouth, throat, and face: ( - ) mucositis, ( - ) sore throat Respiratory: ( - ) cough, ( - ) dyspnea, ( - ) wheezes Cardiovascular: ( - ) palpitation, ( - ) chest discomfort, ( + ) lower extremity swelling Gastrointestinal:  ( + ) nausea, ( - ) heartburn, ( - ) change in bowel habits Skin: ( - ) abnormal skin rashes Lymphatics: ( - ) new lymphadenopathy, ( - ) easy bruising Neurological: ( + ) numbness, ( + ) tingling, ( - ) new weaknesses Behavioral/Psych: ( - ) mood change, ( - ) new changes  All other systems were reviewed with the patient and are negative.  PHYSICAL EXAMINATION: ECOG PERFORMANCE STATUS: 1 - Symptomatic but completely ambulatory  Vitals:   06/22/20 1414  BP: (!) 128/100  Pulse: 77  Resp: 18  Temp: (!) 97.5 F (36.4 C)  SpO2: 94%   Filed Weights   06/22/20 1414  Weight: 196 lb 9.6 oz (89.2 kg)    GENERAL: well appearing female in NAD, obese  SKIN: skin color, texture, turgor are normal, no rashes or significant lesions EYES: conjunctiva are pink and non-injected, sclera clear OROPHARYNX: no exudate, no erythema; lips, buccal mucosa, and tongue normal  NECK: supple, non-tender LYMPH:  no palpable lymphadenopathy in the cervical, axillary or supraclavicular lymph nodes.  LUNGS: clear to auscultation and percussion with normal breathing effort HEART: regular rate & rhythm and no murmurs and no lower extremity edema ABDOMEN: soft, non-tender, non-distended, normal bowel sounds Musculoskeletal: no cyanosis of digits and no clubbing  PSYCH: alert & oriented x 3, fluent speech NEURO: no focal motor/sensory deficits  LABORATORY DATA:  I have reviewed the data as listed CBC Latest Ref Rng & Units 06/22/2020 04/05/2020 11/02/2016  WBC 4.0 - 10.5 K/uL 4.3 11.7(H) 9.1  Hemoglobin 12.0 - 15.0 g/dL 13.2 12.7 13.6  Hematocrit 36.0 - 46.0 % 37.6 37.5 38.2  Platelets 150 - 400 K/uL 179 195 205    CMP Latest Ref Rng & Units 06/22/2020 04/05/2020 11/02/2016  Glucose 70  - 99 mg/dL 81 117(H) 100(H)  BUN 8 - 23 mg/dL 16 26(H) 27(H)  Creatinine 0.44 - 1.00 mg/dL 1.05(H) 1.12(H) 1.02(H)  Sodium 135 - 145 mmol/L 141 132(L) 137  Potassium 3.5 - 5.1 mmol/L 4.1 4.5 4.4  Chloride 98 - 111 mmol/L 108 105 100(L)  CO2 22 - 32 mmol/L 25 19(L) 24  Calcium 8.9 - 10.3 mg/dL 9.1 9.5 9.9  Total Protein 6.5 - 8.1 g/dL 7.1 6.8 -  Total Bilirubin 0.3 - 1.2 mg/dL 0.6 0.8 -  Alkaline Phos 38 - 126 U/L 101 94 -  AST 15 - 41 U/L 20 21 -  ALT 0 - 44 U/L 15 19 -   ASSESSMENT & PLAN Bevelyn Arriola is a 85 y.o. female presenting presenting to the clinic for evaluation for monoclonal gammopathy. Based on initial SPEP,, there is evidence of M-protein at 0.2. 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 Recommend full MGUS workup with CBC, CMP, multiple myeloma panel, kappa/lambda light chain, UPEP, beta 2 microglobulin and LDH levels. Bone Survey will be obtained to evaluate for lytic lesions. Plan to follow up with results by phone. If no additional intervention is required, we will plan to see patient back in clinic in 3 months.    #MGUS: --Recommend further workup with CBC, CMP, multiple myeloma panel, kappa/lambda light chain, UPEP,beta 2 microglobulin and LDH levels.  --Need to obtain DG bone met surgery to evaluate for lytic lesions.  --Will consider bone marrow biopsy based on above workup --RTC in 3 months with labs unless further intervention is required.    Orders Placed This Encounter  Procedures  . DG Bone Survey Met    Standing Status:   Future    Standing Expiration Date:   06/22/2021    Order Specific Question:   Reason for Exam (SYMPTOM  OR DIAGNOSIS REQUIRED)    Answer:   evaluate for lytic lesions.    Order Specific Question:   Preferred imaging location?    Answer:   [place] light chains    Standing Status:   Future    Number of Occurrences:   1    Standing Expiration Date:   06/22/2021  . Multiple Myeloma  Panel (SPEP&IFE  w/QIG)    Standing Status:   Future    Number of Occurrences:   1    Standing Expiration Date:   06/22/2021  . 24-Hr Ur UPEP/UIFE/Light Chains/TP    Standing Status:   Future    Standing Expiration Date:   06/22/2021  . CBC with Differential (Cancer Center Only)    Standing Status:   Future    Number of Occurrences:   1    Standing Expiration Date:   06/22/2021  . CMP ([place] only)    Standing Status:   Future    Number of Occurrences:   1    Standing Expiration Date:   06/22/2021  . Lactate dehydrogenase (LDH)    Standing Status:   Future    Number of Occurrences:   1    Standing Expiration Date:   06/22/2021  . Beta 2 microglobulin    Standing Status:   Future    Number of Occurrences:   1    Standing Expiration Date:   06/22/2021    All questions were answered. The patient knows to call the clinic with any problems, questions or concerns.  I have spent a total of 60 minutes minutes of face-to-face and non-face-to-face time, preparing to see the patient, obtaining and/or reviewing separately obtained history, performing a medically appropriate examination, counseling and educating the patient, ordering tests, documenting clinical information in the electronic health record and care coordination.   Dede Query, PA-C Department of Hematology/Oncology [place] at [facility] Phone: [phone]  Patient was seen with Dr. Lorenso Courier.   I have read the above note and personally examined the patient. I agree with the assessment and plan as noted above.  Briefly Ms. Roehr is an 85 year old female with medical history significant for a monoclonal gammopathy.  At this time note she has an M protein of 0.2 with IgG kappa specificity.  In order to determine if a bone marrow biopsy is required the patient will need serum free light chains, UPEP, and a metastatic survey.  If all of the above studies are normal we can continue to follow the patient as  an MGUS.  If there are any concerning findings we would need to proceed with a bone marrow biopsy.   Ledell Peoples, MD Department of Hematology/Oncology [place] at [facility] Phone: [phone] Pager: [phone] Email: [email or web address]

## 2020-06-23 DIAGNOSIS — D472 Monoclonal gammopathy: Secondary | ICD-10-CM | POA: Insufficient documentation

## 2020-06-23 LAB — KAPPA/LAMBDA LIGHT CHAINS
Kappa free light chain: 42 mg/L — ABNORMAL HIGH (ref 3.3–19.4)
Kappa, lambda light chain ratio: 2.66 — ABNORMAL HIGH (ref 0.26–1.65)
Lambda free light chains: 15.8 mg/L (ref 5.7–26.3)

## 2020-06-24 LAB — BETA 2 MICROGLOBULIN, SERUM: Beta-2 Microglobulin: 3.1 mg/L — ABNORMAL HIGH (ref 0.6–2.4)

## 2020-06-28 LAB — MULTIPLE MYELOMA PANEL, SERUM
Albumin SerPl Elph-Mcnc: 3.7 g/dL (ref 2.9–4.4)
Albumin/Glob SerPl: 1.2 (ref 0.7–1.7)
Alpha 1: 0.3 g/dL (ref 0.0–0.4)
Alpha2 Glob SerPl Elph-Mcnc: 0.8 g/dL (ref 0.4–1.0)
B-Globulin SerPl Elph-Mcnc: 0.9 g/dL (ref 0.7–1.3)
Gamma Glob SerPl Elph-Mcnc: 1.2 g/dL (ref 0.4–1.8)
Globulin, Total: 3.1 g/dL (ref 2.2–3.9)
IgA: 84 mg/dL (ref 64–422)
IgG (Immunoglobin G), Serum: 1308 mg/dL (ref 586–1602)
IgM (Immunoglobulin M), Srm: 40 mg/dL (ref 26–217)
M Protein SerPl Elph-Mcnc: 0.9 g/dL — ABNORMAL HIGH
Total Protein ELP: 6.8 g/dL (ref 6.0–8.5)

## 2020-06-30 ENCOUNTER — Ambulatory Visit (HOSPITAL_COMMUNITY)
Admission: RE | Admit: 2020-06-30 | Discharge: 2020-06-30 | Disposition: A | Discharge status: Home / Self Care | Payer: Medicare Other | Source: Ambulatory Visit | Attending: Physician Assistant | Admitting: Physician Assistant

## 2020-06-30 ENCOUNTER — Other Ambulatory Visit: Payer: Self-pay

## 2020-06-30 ENCOUNTER — Telehealth: Payer: Self-pay | Admitting: Physician Assistant

## 2020-06-30 ENCOUNTER — Other Ambulatory Visit: Payer: Self-pay | Admitting: *Deleted

## 2020-06-30 DIAGNOSIS — D472 Monoclonal gammopathy: Secondary | ICD-10-CM

## 2020-06-30 NOTE — Telephone Encounter (Signed)
Scheduled per los. Called and left msg. Mailed printout  °

## 2020-07-04 ENCOUNTER — Encounter: Payer: Self-pay | Admitting: Physician Assistant

## 2020-07-04 DIAGNOSIS — Z1159 Encounter for screening for other viral diseases: Secondary | ICD-10-CM | POA: Diagnosis not present

## 2020-07-04 DIAGNOSIS — Z20828 Contact with and (suspected) exposure to other viral communicable diseases: Secondary | ICD-10-CM | POA: Diagnosis not present

## 2020-07-05 LAB — UPEP/UIFE/LIGHT CHAINS/TP, 24-HR UR
% BETA, Urine: 0 %
ALPHA 1 URINE: 0 %
Albumin, U: 100 %
Alpha 2, Urine: 0 %
Free Kappa Lt Chains,Ur: 3.23 mg/L (ref 1.17–86.46)
Free Kappa/Lambda Ratio: >4.82 (ref 1.83–14.26)
Free Lambda Lt Chains,Ur: <0.67 mg/L (ref 0.27–15.21)
GAMMA GLOBULIN URINE: 0 %
Total Protein, Urine-Ur/day: 55 mg/24 hr (ref 30–150)
Total Protein, Urine: 4.8 mg/dL
Total Volume: 1150

## 2020-07-07 ENCOUNTER — Telehealth: Payer: Self-pay | Admitting: *Deleted

## 2020-07-07 NOTE — Telephone Encounter (Signed)
TCT patient regarding recent lab results. No answer but was able to leave vm message on identified phone #   Advised that that there was evidence of an abnormal protein, but no further work up is needed. She is to return to clinic in 3 months for f/u labs and clinic visit.  Advised to call with any questions or concerns @ [phone]

## 2020-07-07 NOTE — Telephone Encounter (Signed)
-----   Message from Lincoln Brigham, PA-C sent at 07/07/2020 11:20 AM EDT ----- Can you let the patient know that Dr. Lorenso Courier and I reviewed her lab results. There is evidence of abnormal protein but no further workup is needed. We will plan to see her back in 3 months with repeat labs (already scheduled).  Thanks, [name]   ----- Message ----- From: Buel Ream, Lab In [place] Sent: 07/05/2020   5:37 PM EDT To: Lincoln Brigham, PA-C

## 2020-07-11 ENCOUNTER — Encounter: Payer: Self-pay | Admitting: Physician Assistant

## 2020-07-11 DIAGNOSIS — Z20828 Contact with and (suspected) exposure to other viral communicable diseases: Secondary | ICD-10-CM | POA: Diagnosis not present

## 2020-07-11 DIAGNOSIS — Z1159 Encounter for screening for other viral diseases: Secondary | ICD-10-CM | POA: Diagnosis not present

## 2020-08-08 DIAGNOSIS — Z1159 Encounter for screening for other viral diseases: Secondary | ICD-10-CM | POA: Diagnosis not present

## 2020-08-08 DIAGNOSIS — Z20828 Contact with and (suspected) exposure to other viral communicable diseases: Secondary | ICD-10-CM | POA: Diagnosis not present

## 2020-08-15 DIAGNOSIS — H401112 Primary open-angle glaucoma, right eye, moderate stage: Secondary | ICD-10-CM | POA: Diagnosis not present

## 2020-08-15 DIAGNOSIS — H401123 Primary open-angle glaucoma, left eye, severe stage: Secondary | ICD-10-CM | POA: Diagnosis not present

## 2020-08-18 ENCOUNTER — Telehealth: Payer: Self-pay | Admitting: Interventional Cardiology

## 2020-08-18 NOTE — Telephone Encounter (Signed)
Patient states she thinks she has bad circulation in her legs. She states the skin is turning dark by both her ankles. She states it started on one of them years ago and now the right leg is getting like it too. She says she also had a procedure years ago, because her blood was flowing differently. She says she has no other symptoms.

## 2020-08-18 NOTE — Telephone Encounter (Signed)
Returned call to Pt.  Pt is concerned about her leg circulation.    Pt has previously seen vascular and vein specialists for chronic venous insufficiency.  Encouraged Pt to make follow up visit with that office for evaluation of her legs.  Pt will call their office to schedule appointment.

## 2020-08-29 DIAGNOSIS — N1832 Chronic kidney disease, stage 3b: Secondary | ICD-10-CM | POA: Diagnosis not present

## 2020-08-29 DIAGNOSIS — Z20828 Contact with and (suspected) exposure to other viral communicable diseases: Secondary | ICD-10-CM | POA: Diagnosis not present

## 2020-08-29 DIAGNOSIS — N2581 Secondary hyperparathyroidism of renal origin: Secondary | ICD-10-CM | POA: Diagnosis not present

## 2020-08-29 DIAGNOSIS — N189 Chronic kidney disease, unspecified: Secondary | ICD-10-CM | POA: Diagnosis not present

## 2020-08-29 DIAGNOSIS — D631 Anemia in chronic kidney disease: Secondary | ICD-10-CM | POA: Diagnosis not present

## 2020-08-29 DIAGNOSIS — Z1159 Encounter for screening for other viral diseases: Secondary | ICD-10-CM | POA: Diagnosis not present

## 2020-09-05 DIAGNOSIS — Z8616 Personal history of COVID-19: Secondary | ICD-10-CM | POA: Diagnosis not present

## 2020-09-20 ENCOUNTER — Other Ambulatory Visit: Payer: Self-pay | Admitting: Physician Assistant

## 2020-09-20 DIAGNOSIS — D472 Monoclonal gammopathy: Secondary | ICD-10-CM

## 2020-09-21 ENCOUNTER — Inpatient Hospital Stay: Payer: Medicare Other

## 2020-09-21 ENCOUNTER — Inpatient Hospital Stay: Payer: Medicare Other | Attending: Physician Assistant | Admitting: Physician Assistant

## 2020-09-21 ENCOUNTER — Other Ambulatory Visit: Payer: Self-pay

## 2020-09-21 VITALS — BP 141/61 | HR 67 | Temp 98.2°F | Resp 19 | Ht 62.0 in | Wt 192.8 lb

## 2020-09-21 DIAGNOSIS — D472 Monoclonal gammopathy: Secondary | ICD-10-CM | POA: Diagnosis not present

## 2020-09-21 DIAGNOSIS — R202 Paresthesia of skin: Secondary | ICD-10-CM | POA: Insufficient documentation

## 2020-09-21 DIAGNOSIS — R200 Anesthesia of skin: Secondary | ICD-10-CM | POA: Insufficient documentation

## 2020-09-21 DIAGNOSIS — I872 Venous insufficiency (chronic) (peripheral): Secondary | ICD-10-CM | POA: Insufficient documentation

## 2020-09-21 DIAGNOSIS — Z809 Family history of malignant neoplasm, unspecified: Secondary | ICD-10-CM | POA: Diagnosis not present

## 2020-09-21 DIAGNOSIS — R600 Localized edema: Secondary | ICD-10-CM | POA: Insufficient documentation

## 2020-09-21 DIAGNOSIS — Z90710 Acquired absence of both cervix and uterus: Secondary | ICD-10-CM | POA: Diagnosis not present

## 2020-09-21 DIAGNOSIS — Z87891 Personal history of nicotine dependence: Secondary | ICD-10-CM | POA: Diagnosis not present

## 2020-09-21 LAB — CBC WITH DIFFERENTIAL (CANCER CENTER ONLY)
Abs Immature Granulocytes: 0.01 10*3/uL (ref 0.00–0.07)
Basophils Absolute: 0.1 10*3/uL (ref 0.0–0.1)
Basophils Relative: 2 %
Eosinophils Absolute: 0.3 10*3/uL (ref 0.0–0.5)
Eosinophils Relative: 6 %
HCT: 35.8 % — ABNORMAL LOW (ref 36.0–46.0)
Hemoglobin: 12.7 g/dL (ref 12.0–15.0)
Immature Granulocytes: 0 %
Lymphocytes Relative: 29 %
Lymphs Abs: 1.3 10*3/uL (ref 0.7–4.0)
MCH: 33 pg (ref 26.0–34.0)
MCHC: 35.5 g/dL (ref 30.0–36.0)
MCV: 93 fL (ref 80.0–100.0)
Monocytes Absolute: 0.4 10*3/uL (ref 0.1–1.0)
Monocytes Relative: 9 %
Neutro Abs: 2.5 10*3/uL (ref 1.7–7.7)
Neutrophils Relative %: 54 %
Platelet Count: 206 10*3/uL (ref 150–400)
RBC: 3.85 MIL/uL — ABNORMAL LOW (ref 3.87–5.11)
RDW: 12.4 % (ref 11.5–15.5)
WBC Count: 4.6 10*3/uL (ref 4.0–10.5)
nRBC: 0 % (ref 0.0–0.2)

## 2020-09-21 LAB — CMP (CANCER CENTER ONLY)
ALT: 13 U/L (ref 0–44)
AST: 18 U/L (ref 15–41)
Albumin: 3.7 g/dL (ref 3.5–5.0)
Alkaline Phosphatase: 112 U/L (ref 38–126)
Anion gap: 8 (ref 5–15)
BUN: 23 mg/dL (ref 8–23)
CO2: 23 mmol/L (ref 22–32)
Calcium: 9.4 mg/dL (ref 8.9–10.3)
Chloride: 107 mmol/L (ref 98–111)
Creatinine: 1.23 mg/dL — ABNORMAL HIGH (ref 0.44–1.00)
GFR, Estimated: 44 mL/min — ABNORMAL LOW (ref >60)
Glucose, Bld: 102 mg/dL — ABNORMAL HIGH (ref 70–99)
Potassium: 4 mmol/L (ref 3.5–5.1)
Sodium: 138 mmol/L (ref 135–145)
Total Bilirubin: 0.6 mg/dL (ref 0.3–1.2)
Total Protein: 6.9 g/dL (ref 6.5–8.1)

## 2020-09-21 LAB — LACTATE DEHYDROGENASE: LDH: 173 U/L (ref 98–192)

## 2020-09-21 NOTE — Progress Notes (Signed)
[place] [phone]   Fax:(336) [place] PROGRESS NOTE  Patient Care Team: [name], MD as PCP - General (Internal Medicine) Laurence Spates, MD (Inactive) (Gastroenterology) Mcarthur Rossetti, MD (Orthopedic Surgery) Erline Levine, MD (Neurosurgery) Teofilo Pod, MD (Ophthalmology) Petrinitz, Dellis Filbert, DPM (Inactive) (Podiatry) Nunzio Cobbs, MD (Obstetrics and Gynecology) Rolm Bookbinder, MD (Dermatology) Shon Hough, MD (Ophthalmology)  Hematological/Oncological History 1) 06/07/2020: Labs from [place] revealed m-protein 0.2. 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  2) 06/22/2020: Establish care with [name]  CHIEF COMPLAINTS/PURPOSE OF CONSULTATION:  "MGUS"  HISTORY OF PRESENTING ILLNESS:  [name] 85 y.o. female returns for a routine follow up for MGUS. Patient is unaccompanied for this visit.   On exam today, [name] reports that her energy levels are fairly stable. She is happy to share that she has lost a few pounds since the last visit. She denies any nausea, vomiting or abdominal pain. Patient has regular bowel movements without any constipation or diarrhea. She denies easy bruising or signs of bleeding. Patient has lower extremity edema, left greater than right. She has numbness and tingling in her fingers and feet, that doesn't interfere with her balance or grip. Patient has discoloration in her lower extremity due to chronic venous insufficiency. Patient denies any fevers, chills, night sweats, shortness of breath, chest pain or cough. She has no other complaints. Rest of the 10 point ROS is below.    MEDICAL HISTORY:  Past Medical History:  Diagnosis Date   Arthritis    Bilateral edema of lower extremity    CKD (chronic kidney disease)    Constipation    Degenerative arthritis of hip    s/p THR 05/2011   Depression     Glaucoma    Headache(784.0)    HLD (hyperlipidemia)    Hypertension    OAB (overactive bladder)    Osteoporosis    Primary osteoarthritis of right knee    Slow transit constipation    Unsteady gait    Venous insufficiency     SURGICAL HISTORY: Past Surgical History:  Procedure Laterality Date    cataracts removed  2013 L   ABDOMINAL HYSTERECTOMY  2003   BREAST BIOPSY  1960   BREAST CYSTS     X2 BENIGN   BREAST EXCISIONAL BIOPSY Left    BREAST SURGERY     ENDOVENOUS ABLATION SAPHENOUS VEIN W/ LASER Left 06/04/2019   endovenous laser ablation left greater saphenous vein by Gae Gallop MD    GLAUCOMA VALVE INSERTION Bilateral 2013, 2014   bond ([facility])   REDUCTION MAMMAPLASTY Bilateral 2000   TOTAL HIP ARTHROPLASTY  06/01/2011   Procedure: TOTAL HIP ARTHROPLASTY ANTERIOR APPROACH;  Surgeon: Mcarthur Rossetti, MD;  Location: [facility];  Service: Orthopedics;  Laterality: Left;  Left Total Hip Replacement, Direct Anterior Approach   TOTAL KNEE ARTHROPLASTY Right 03/14/2015   Procedure: TOTAL KNEE ARTHROPLASTY;  Surgeon: Gaynelle Arabian, MD;  Location: [facility];  Service: Orthopedics;  Laterality: Right;    SOCIAL HISTORY: Social History   Socioeconomic History   Marital status: Divorced    Spouse name: Not on file   Number of children: Not on file   Years of education: Not on file   Highest education level: Not on file  Occupational History   Not on file  Tobacco Use   Smoking status: Former    Packs/day: 1.00    Years:  35.00    Pack years: 35.00    Types: Cigarettes    Quit date: 05/27/1980    Years since quitting: 40.3   Smokeless tobacco: Never  Vaping Use   Vaping Use: Never used  Substance and Sexual Activity   Alcohol use: No    Alcohol/week: 0.0 standard drinks   Drug use: No   Sexual activity: Not on file  Other Topics Concern   Not on file  Social History Narrative   Lives [place] in [place] at [place].  One child, [name].  Education: college.   Social  Determinants of Health   Financial Resource Strain: Not on file  Food Insecurity: Not on file  Transportation Needs: Not on file  Physical Activity: Not on file  Stress: Not on file  Social Connections: Not on file  Intimate Partner Violence: Not on file    FAMILY HISTORY: Family History  Problem Relation Age of Onset   Cancer Mother    Alcohol abuse Father    Emphysema Father    Stroke Brother    Melanoma Brother     ALLERGIES:  has No Known Allergies.  MEDICATIONS:  Current Outpatient Medications  Medication Sig Dispense Refill   brimonidine (ALPHAGAN) 0.2 % ophthalmic solution Place 1 drop into the left eye 3 (three) times daily.     carvedilol (COREG) 25 MG tablet Take 25 mg by mouth 2 (two) times daily.     dorzolamidel-timolol (COSOPT) 22.3-6.8 MG/ML SOLN ophthalmic solution Place 1 drop into both eyes 2 (two) times daily.     furosemide (LASIX) 40 MG tablet Take 40 mg by mouth daily.     gabapentin (NEURONTIN) 300 MG capsule Take 1 capsule (300 mg total) by mouth at bedtime. 30 capsule 1   latanoprost (XALATAN) 0.005 % ophthalmic solution Place 1 drop into the left eye nightly.     losartan (COZAAR) 100 MG tablet Take 100 mg by mouth daily.     timolol (TIMOPTIC) 0.5 % ophthalmic solution Place 1 drop into both eyes 2 (two) times daily.     ANORO ELLIPTA 62.5-25 MCG/INH AEPB Inhale 1 puff into the lungs daily as needed (copd). (Patient not taking: Reported on 09/21/2020)     lisinopril (PRINIVIL,ZESTRIL) 30 MG tablet take 1 tablet by mouth once daily (Patient not taking: Reported on 09/21/2020) 90 tablet 1   No current facility-administered medications for this visit.    REVIEW OF SYSTEMS:   Constitutional: ( - ) fevers, ( - )  chills , ( - ) night sweats Eyes: ( - ) blurriness of vision, ( - ) double vision, ( - ) watery eyes Ears, nose, mouth, throat, and face: ( - ) mucositis, ( - ) sore throat Respiratory: ( - ) cough, ( - ) dyspnea, ( - ) wheezes Cardiovascular:  ( - ) palpitation, ( - ) chest discomfort, ( + ) lower extremity swelling Gastrointestinal:  ( + ) nausea, ( - ) heartburn, ( - ) change in bowel habits Skin: ( - ) abnormal skin rashes Lymphatics: ( - ) new lymphadenopathy, ( - ) easy bruising Neurological: ( + ) numbness, ( + ) tingling, ( - ) new weaknesses Behavioral/Psych: ( - ) mood change, ( - ) new changes  All other systems were reviewed with the patient and are negative.  PHYSICAL EXAMINATION: ECOG PERFORMANCE STATUS: 1 - Symptomatic but completely ambulatory  Vitals:   09/21/20 1417  BP: (!) 141/61  Pulse: 67  Resp: 19  Temp: 98.2  F (36.8 C)  SpO2: 97%   Filed Weights   09/21/20 1417  Weight: 192 lb 12.8 oz (87.5 kg)    GENERAL: well appearing female in NAD, obese  SKIN: skin color, texture, turgor are normal, no rashes or significant lesions. Hyperpigmentation of bilateral lower extremities.  EYES: conjunctiva are pink and non-injected, sclera clear OROPHARYNX: no exudate, no erythema; lips, buccal mucosa, and tongue normal LUNGS: clear to auscultation and percussion with normal breathing effort HEART: regular rate & rhythm and no murmurs. Bilateral lower extremity edema.  ABDOMEN: soft, non-tender, non-distended, normal bowel sounds Musculoskeletal: no cyanosis of digits and no clubbing  PSYCH: alert & oriented x 3, fluent speech NEURO: no focal motor/sensory deficits  LABORATORY DATA:  I have reviewed the data as listed CBC Latest Ref Rng & Units 09/21/2020 06/22/2020 04/05/2020  WBC 4.0 - 10.5 K/uL 4.6 4.3 11.7(H)  Hemoglobin 12.0 - 15.0 g/dL 12.7 13.2 12.7  Hematocrit 36.0 - 46.0 % 35.8(L) 37.6 37.5  Platelets 150 - 400 K/uL 206 179 195    CMP Latest Ref Rng & Units 09/21/2020 06/22/2020 04/05/2020  Glucose 70 - 99 mg/dL 102(H) 81 117(H)  BUN 8 - 23 mg/dL 23 16 26(H)  Creatinine 0.44 - 1.00 mg/dL 1.23(H) 1.05(H) 1.12(H)  Sodium 135 - 145 mmol/L 138 141 132(L)  Potassium 3.5 - 5.1 mmol/L 4.0 4.1 4.5   Chloride 98 - 111 mmol/L 107 108 105  CO2 22 - 32 mmol/L 23 25 19(L)  Calcium 8.9 - 10.3 mg/dL 9.4 9.1 9.5  Total Protein 6.5 - 8.1 g/dL 6.9 7.1 6.8  Total Bilirubin 0.3 - 1.2 mg/dL 0.6 0.6 0.8  Alkaline Phos 38 - 126 U/L 112 101 94  AST 15 - 41 U/L '18 20 21  ' ALT 0 - 44 U/L '13 15 19   ' ASSESSMENT & PLAN [name] is a 85 y.o. female presenting presenting to the clinic for evaluation for monoclonal gammopathy.    #MGUS: --Labs from 06/22/2020 showed SPEP with M protein of 0.9. IFE showed IgG monoclonal protein with kappa light chain specificity. UPEP was unremarkable. Kappa free light chain was elevated at 42 and kappa/lambda ration was elevated at 2.66 in the setting of chronic kidney disease.  --DG bone met survey from 06/30/2020 did not show lytic lesions.  --Labs today show CBC was unremarkable, CMP revealed creatinine level of 1.23. MM panel and serum free light chains are pending.  --RTC in 6 months unless pending labs require further workup.    Orders Placed This Encounter  Procedures   CBC with Differential ([place] Only)    Standing Status:   Future    Standing Expiration Date:   09/21/2021   CMP ([place] only)    Standing Status:   Future    Standing Expiration Date:   09/21/2021   Kappa/lambda light chains    Standing Status:   Future    Standing Expiration Date:   09/21/2021   Multiple Myeloma Panel (SPEP&IFE w/QIG)    Standing Status:   Future    Standing Expiration Date:   09/21/2021     All questions were answered. The patient knows to call the clinic with any problems, questions or concerns.  I have spent a total of 25 minutes minutes of face-to-face and non-face-to-face time, preparing to see the patient, performing a medically appropriate examination, counseling and educating the patient, ordering tests, documenting clinical information in the electronic health record and care coordination.   Dede Query, PA-C Department  of Hematology/Oncology [place] at [facility] Phone: [phone]

## 2020-09-22 LAB — KAPPA/LAMBDA LIGHT CHAINS
Kappa free light chain: 44 mg/L — ABNORMAL HIGH (ref 3.3–19.4)
Kappa, lambda light chain ratio: 2.95 — ABNORMAL HIGH (ref 0.26–1.65)
Lambda free light chains: 14.9 mg/L (ref 5.7–26.3)

## 2020-09-22 LAB — BETA 2 MICROGLOBULIN, SERUM: Beta-2 Microglobulin: 2.9 mg/L — ABNORMAL HIGH (ref 0.6–2.4)

## 2020-09-24 LAB — MULTIPLE MYELOMA PANEL, SERUM
Albumin SerPl Elph-Mcnc: 3.7 g/dL (ref 2.9–4.4)
Albumin/Glob SerPl: 1.4 (ref 0.7–1.7)
Alpha 1: 0.2 g/dL (ref 0.0–0.4)
Alpha2 Glob SerPl Elph-Mcnc: 0.6 g/dL (ref 0.4–1.0)
B-Globulin SerPl Elph-Mcnc: 0.8 g/dL (ref 0.7–1.3)
Gamma Glob SerPl Elph-Mcnc: 1 g/dL (ref 0.4–1.8)
Globulin, Total: 2.7 g/dL (ref 2.2–3.9)
IgA: 85 mg/dL (ref 64–422)
IgG (Immunoglobin G), Serum: 1219 mg/dL (ref 586–1602)
IgM (Immunoglobulin M), Srm: 43 mg/dL (ref 26–217)
M Protein SerPl Elph-Mcnc: 0.8 g/dL — ABNORMAL HIGH
Total Protein ELP: 6.4 g/dL (ref 6.0–8.5)

## 2020-09-26 DIAGNOSIS — Z8616 Personal history of COVID-19: Secondary | ICD-10-CM | POA: Diagnosis not present

## 2020-10-10 DIAGNOSIS — Z8616 Personal history of COVID-19: Secondary | ICD-10-CM | POA: Diagnosis not present

## 2020-10-17 DIAGNOSIS — Z8616 Personal history of COVID-19: Secondary | ICD-10-CM | POA: Diagnosis not present

## 2020-10-21 DIAGNOSIS — R5383 Other fatigue: Secondary | ICD-10-CM | POA: Diagnosis not present

## 2020-10-21 DIAGNOSIS — M25561 Pain in right knee: Secondary | ICD-10-CM | POA: Diagnosis not present

## 2020-10-21 DIAGNOSIS — R609 Edema, unspecified: Secondary | ICD-10-CM | POA: Diagnosis not present

## 2020-10-21 DIAGNOSIS — M5136 Other intervertebral disc degeneration, lumbar region: Secondary | ICD-10-CM | POA: Diagnosis not present

## 2020-10-21 DIAGNOSIS — H04123 Dry eye syndrome of bilateral lacrimal glands: Secondary | ICD-10-CM | POA: Diagnosis not present

## 2020-10-21 DIAGNOSIS — G629 Polyneuropathy, unspecified: Secondary | ICD-10-CM | POA: Diagnosis not present

## 2020-10-21 DIAGNOSIS — M25552 Pain in left hip: Secondary | ICD-10-CM | POA: Diagnosis not present

## 2020-10-21 DIAGNOSIS — I872 Venous insufficiency (chronic) (peripheral): Secondary | ICD-10-CM | POA: Diagnosis not present

## 2020-10-21 DIAGNOSIS — R82998 Other abnormal findings in urine: Secondary | ICD-10-CM | POA: Diagnosis not present

## 2020-10-21 DIAGNOSIS — E785 Hyperlipidemia, unspecified: Secondary | ICD-10-CM | POA: Diagnosis not present

## 2020-10-27 DIAGNOSIS — I10 Essential (primary) hypertension: Secondary | ICD-10-CM | POA: Diagnosis not present

## 2020-10-27 DIAGNOSIS — Z23 Encounter for immunization: Secondary | ICD-10-CM | POA: Diagnosis not present

## 2020-10-27 DIAGNOSIS — M5136 Other intervertebral disc degeneration, lumbar region: Secondary | ICD-10-CM | POA: Diagnosis not present

## 2020-10-27 DIAGNOSIS — I872 Venous insufficiency (chronic) (peripheral): Secondary | ICD-10-CM | POA: Diagnosis not present

## 2020-10-27 DIAGNOSIS — E785 Hyperlipidemia, unspecified: Secondary | ICD-10-CM | POA: Diagnosis not present

## 2020-10-27 DIAGNOSIS — Z1331 Encounter for screening for depression: Secondary | ICD-10-CM | POA: Diagnosis not present

## 2020-10-27 DIAGNOSIS — J449 Chronic obstructive pulmonary disease, unspecified: Secondary | ICD-10-CM | POA: Diagnosis not present

## 2020-10-27 DIAGNOSIS — I700 Atherosclerosis of aorta: Secondary | ICD-10-CM | POA: Diagnosis not present

## 2020-10-27 DIAGNOSIS — R197 Diarrhea, unspecified: Secondary | ICD-10-CM | POA: Diagnosis not present

## 2020-10-27 DIAGNOSIS — Z1339 Encounter for screening examination for other mental health and behavioral disorders: Secondary | ICD-10-CM | POA: Diagnosis not present

## 2020-10-27 DIAGNOSIS — N1832 Chronic kidney disease, stage 3b: Secondary | ICD-10-CM | POA: Diagnosis not present

## 2020-10-27 DIAGNOSIS — Z0000 Encounter for general adult medical examination without abnormal findings: Secondary | ICD-10-CM | POA: Diagnosis not present

## 2020-10-31 DIAGNOSIS — Z20828 Contact with and (suspected) exposure to other viral communicable diseases: Secondary | ICD-10-CM | POA: Diagnosis not present

## 2020-11-01 IMAGING — RF DG ESOPHAGUS
9 of 10 series · 14 of 24 positions shown · non-contrast
Comparison: 02/11/2019 CT abdomen/pelvis.

CLINICAL DATA: Dysphagia with sensation of solid food sticking in
the throat and choking episodes with liquids.

EXAM:
ESOPHOGRAM / BARIUM SWALLOW / BARIUM TABLET STUDY
TECHNIQUE: Combined double contrast and single contrast examination performed
using effervescent crystals, thick barium liquid, and thin barium
liquid. The patient was observed with fluoroscopy swallowing a 13 mm
barium sulphate tablet.
FLUOROSCOPY TIME:  Fluoroscopy Time:  2 minutes 48 seconds
Radiation Exposure Index (if provided by the fluoroscopic device):
181 mGy
Number of Acquired Spot Images: 7

[Series 1: sequence · 2 of 23 frames shown (1 of 7)]
[frame 4/23]
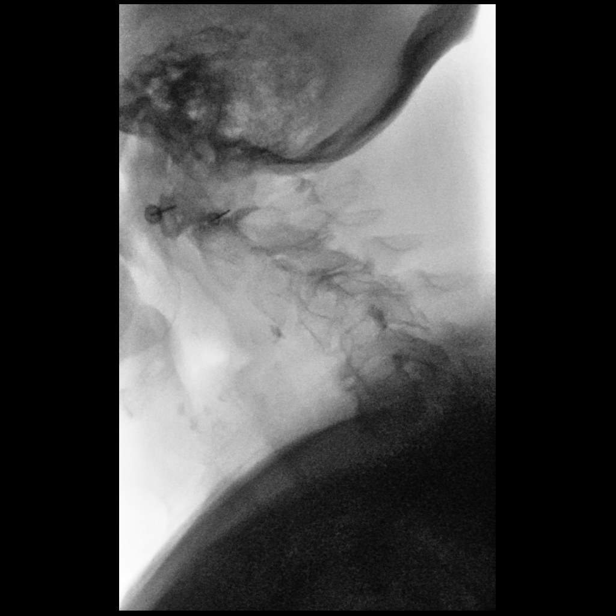
[frame 20/23]
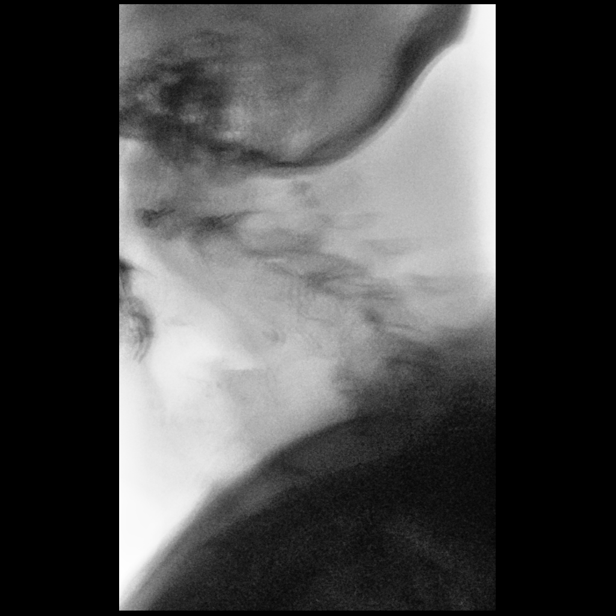

[Series 2: sequence · 1 of 20 frames shown (2 of 7)]
[frame 11/20]
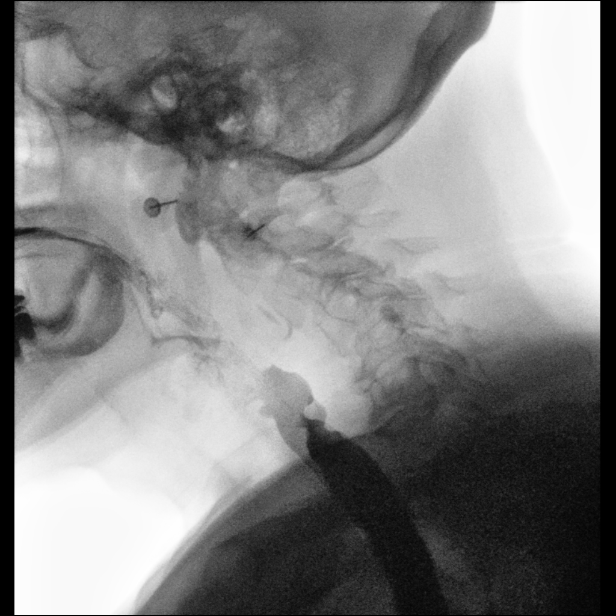

[Series 4: sequence · 2 of 18 frames shown (3 of 7)]
[frame 3/18]
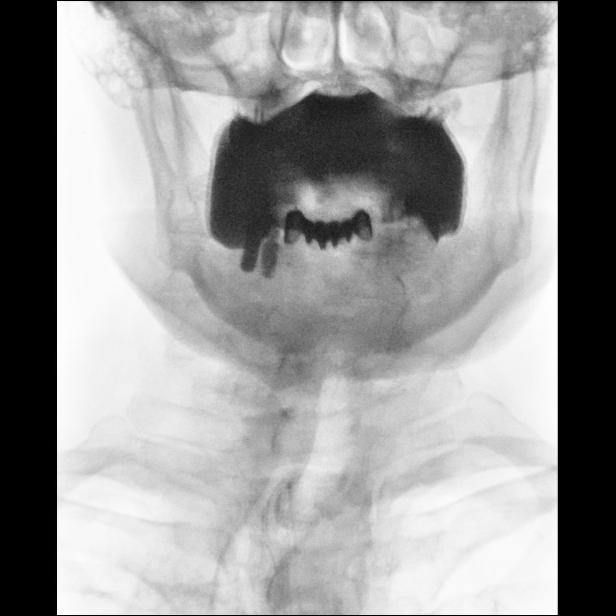
[frame 10/18]
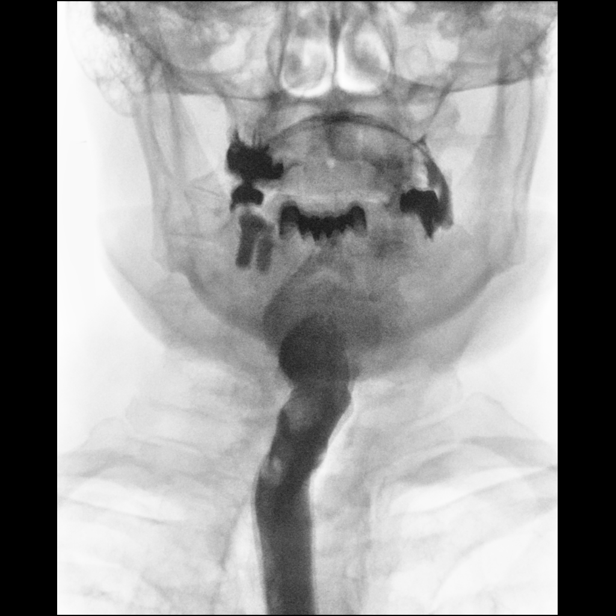

[Series 5: one shot · 0.15mm/px · 3 of 6 slices shown (1 of 2)]
[im 2/6]
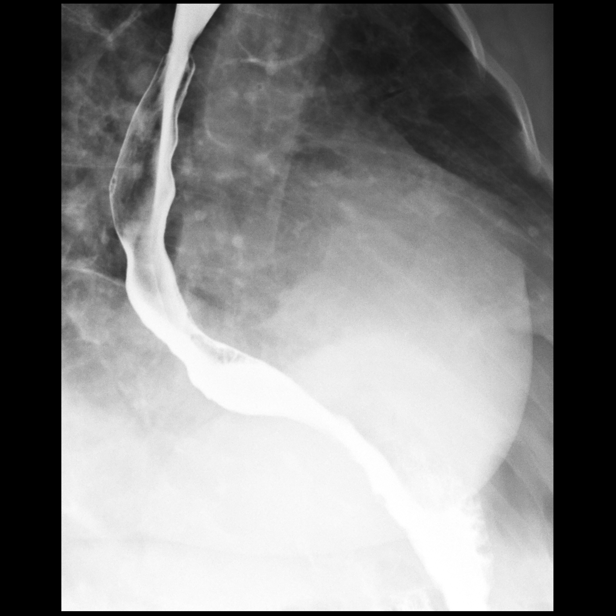
[im 5/6]
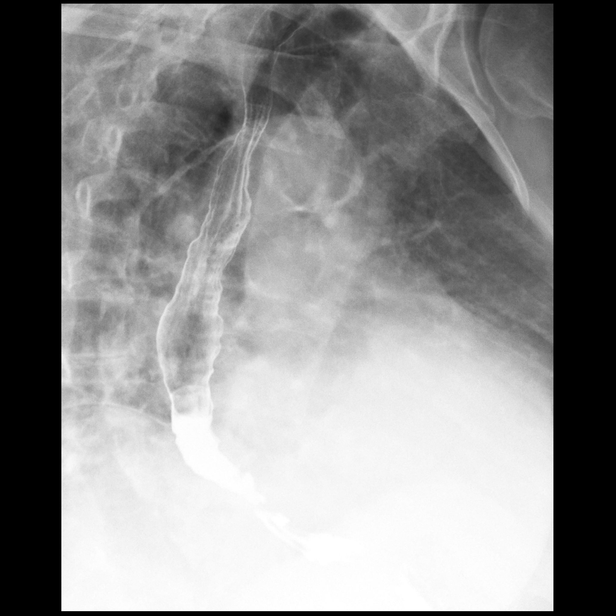
[im 6/6]
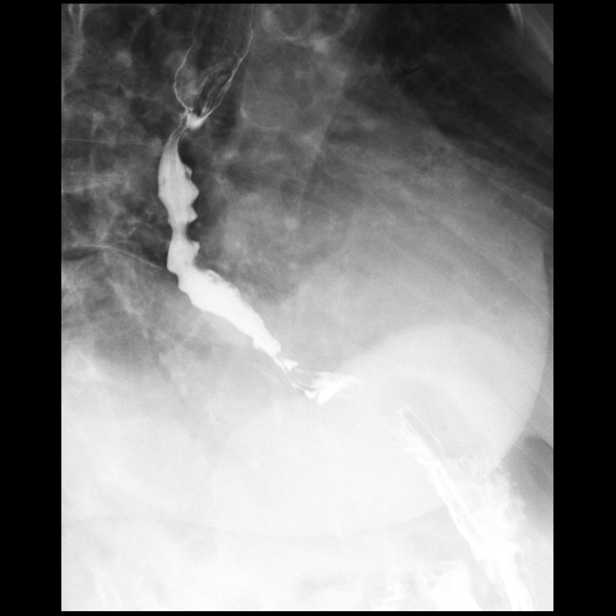

[Series 6: sequence · 1 of 52 frames shown (4 of 7)]
[frame 45/52]
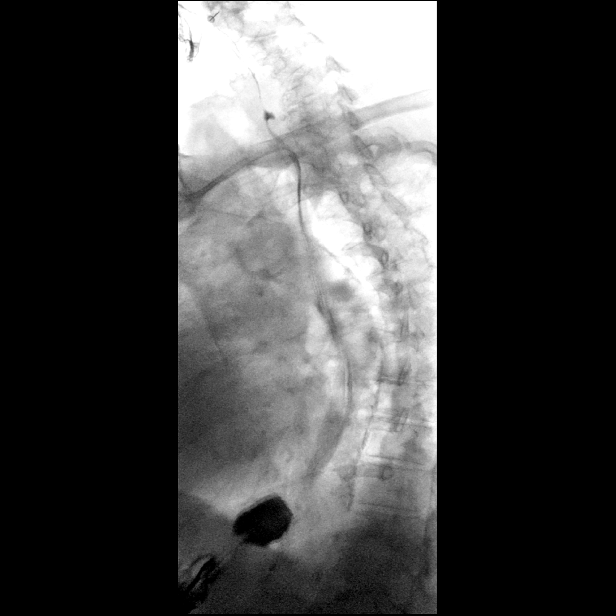

[Series 7: sequence · 1 of 119 frames shown (5 of 7)]
[frame 18/119]
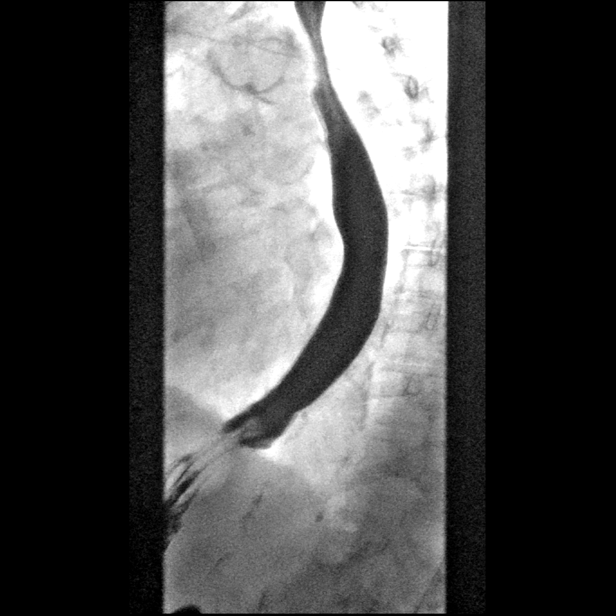

[Series 8: sequence · 2 of 43 frames shown (6 of 7)]
[frame 7/43]
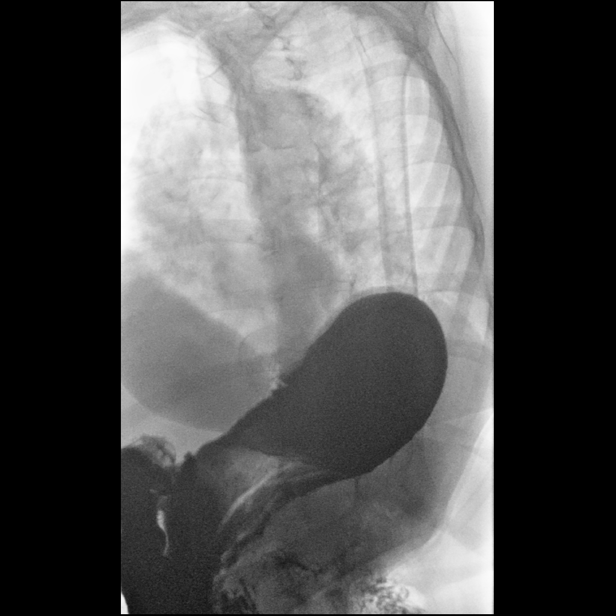
[frame 30/43]
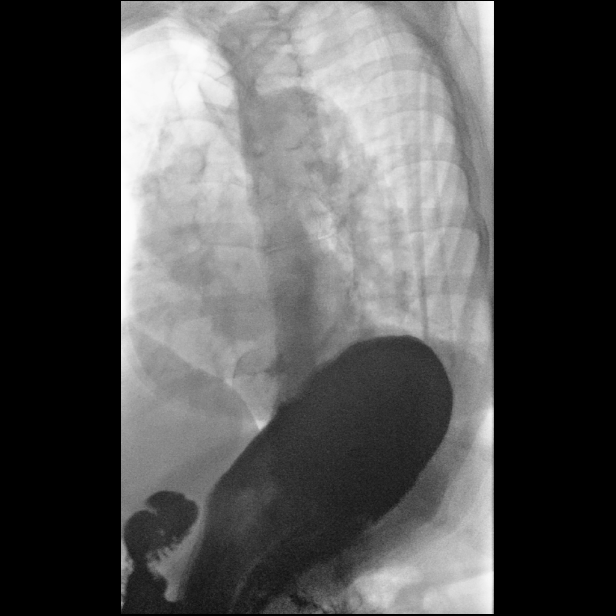

[Series 10: sequence · 1 of 43 frames shown (7 of 7)]
[frame 7/43]
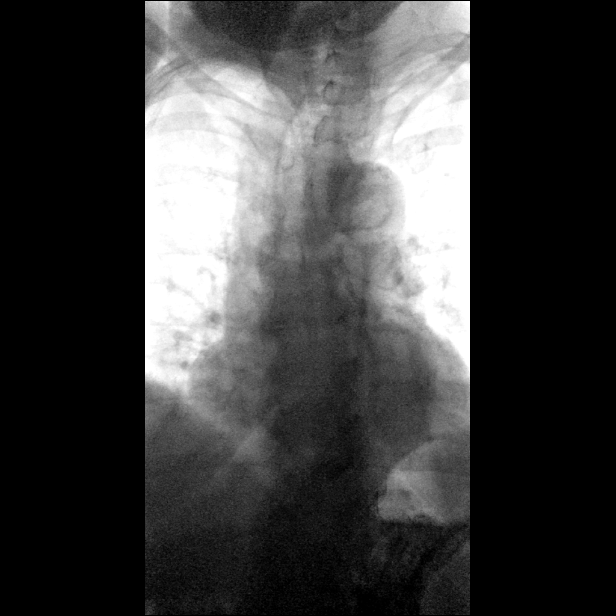

[Series 11: one shot · 1 of 1 slices shown (2 of 2)]
[im 1/1]
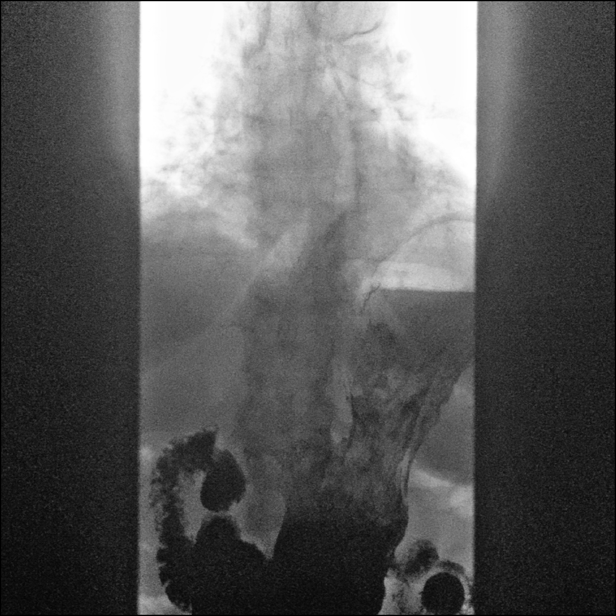

[14 of 24 positions shown; findings below may reference images not displayed]

FINDINGS: Normal oral and pharyngeal phase of swallowing, with no laryngeal
penetration or tracheobronchial aspiration. No significant barium
retention in the pharynx. Moderate cricopharyngeus muscle
dysfunction, characterized by delayed and incomplete opening.

Mild esophageal dysmotility, characterized by intermittent mild
weakening of primary peristalsis in the thoracic esophagus. Small
sliding hiatal hernia. Minimal gastroesophageal reflux elicited to
the level of the lower thoracic esophagus with water siphon test.
Normal esophageal mucosa, with no evidence of reflux esophagitis.
Normal esophageal distensibility, with no evidence of esophageal
mass, stricture or ulcer. Barium tablet traversed the esophagus into
the stomach without delay.
IMPRESSION: 1. Normal oral and pharyngeal phase of swallowing, with no laryngeal
penetration or tracheobronchial aspiration.
2. Small sliding hiatal hernia. Minimal gastroesophageal reflux.
3. Mild esophageal dysmotility.
4. Moderate cricopharyngeus muscle dysfunction, characteristic of
chronic gastroesophageal reflux disease.
5. No evidence of reflux esophagitis. No evidence of soft tissue
mass or stricture.

## 2020-11-07 DIAGNOSIS — Z8616 Personal history of COVID-19: Secondary | ICD-10-CM | POA: Diagnosis not present

## 2020-11-21 DIAGNOSIS — Z20828 Contact with and (suspected) exposure to other viral communicable diseases: Secondary | ICD-10-CM | POA: Diagnosis not present

## 2020-11-28 DIAGNOSIS — Z8616 Personal history of COVID-19: Secondary | ICD-10-CM | POA: Diagnosis not present

## 2020-12-12 DIAGNOSIS — H401112 Primary open-angle glaucoma, right eye, moderate stage: Secondary | ICD-10-CM | POA: Diagnosis not present

## 2020-12-12 DIAGNOSIS — H401123 Primary open-angle glaucoma, left eye, severe stage: Secondary | ICD-10-CM | POA: Diagnosis not present

## 2021-01-09 DIAGNOSIS — Z20828 Contact with and (suspected) exposure to other viral communicable diseases: Secondary | ICD-10-CM | POA: Diagnosis not present

## 2021-01-09 DIAGNOSIS — Z1159 Encounter for screening for other viral diseases: Secondary | ICD-10-CM | POA: Diagnosis not present

## 2021-01-30 DIAGNOSIS — Z20828 Contact with and (suspected) exposure to other viral communicable diseases: Secondary | ICD-10-CM | POA: Diagnosis not present

## 2021-02-06 DIAGNOSIS — Z20822 Contact with and (suspected) exposure to covid-19: Secondary | ICD-10-CM | POA: Diagnosis not present

## 2021-02-13 DIAGNOSIS — Z20828 Contact with and (suspected) exposure to other viral communicable diseases: Secondary | ICD-10-CM | POA: Diagnosis not present

## 2021-02-17 ENCOUNTER — Other Ambulatory Visit: Payer: Self-pay | Admitting: Internal Medicine

## 2021-02-17 DIAGNOSIS — Z1231 Encounter for screening mammogram for malignant neoplasm of breast: Secondary | ICD-10-CM

## 2021-02-27 DIAGNOSIS — Z20828 Contact with and (suspected) exposure to other viral communicable diseases: Secondary | ICD-10-CM | POA: Diagnosis not present

## 2021-03-16 ENCOUNTER — Ambulatory Visit: Payer: Medicare Other

## 2021-03-20 DIAGNOSIS — N2581 Secondary hyperparathyroidism of renal origin: Secondary | ICD-10-CM | POA: Diagnosis not present

## 2021-03-20 DIAGNOSIS — D631 Anemia in chronic kidney disease: Secondary | ICD-10-CM | POA: Diagnosis not present

## 2021-03-20 DIAGNOSIS — N1832 Chronic kidney disease, stage 3b: Secondary | ICD-10-CM | POA: Diagnosis not present

## 2021-03-20 DIAGNOSIS — N189 Chronic kidney disease, unspecified: Secondary | ICD-10-CM | POA: Diagnosis not present

## 2021-03-21 ENCOUNTER — Telehealth: Payer: Self-pay

## 2021-03-21 NOTE — Telephone Encounter (Signed)
Message received  from pt stating she has already had labs drawn and does not need labs at her next [place] on 03/22/21.  Attempted to contact pt regarding lab work.  LM

## 2021-03-22 ENCOUNTER — Inpatient Hospital Stay: Payer: Medicare Other | Attending: Internal Medicine

## 2021-03-22 ENCOUNTER — Inpatient Hospital Stay: Payer: Medicare Other | Admitting: Physician Assistant

## 2021-03-28 ENCOUNTER — Other Ambulatory Visit: Payer: Self-pay

## 2021-03-28 ENCOUNTER — Ambulatory Visit
Admission: RE | Admit: 2021-03-28 | Discharge: 2021-03-28 | Disposition: A | Discharge status: Home / Self Care | Payer: Medicare Other | Source: Ambulatory Visit | Attending: Internal Medicine | Admitting: Internal Medicine

## 2021-03-28 DIAGNOSIS — Z1231 Encounter for screening mammogram for malignant neoplasm of breast: Secondary | ICD-10-CM

## 2021-04-07 ENCOUNTER — Inpatient Hospital Stay: Payer: Medicare Other | Admitting: Physician Assistant

## 2021-04-07 ENCOUNTER — Inpatient Hospital Stay: Payer: Medicare Other

## 2021-04-10 ENCOUNTER — Telehealth: Payer: Self-pay | Admitting: Physician Assistant

## 2021-04-10 NOTE — Telephone Encounter (Signed)
rescheduled per 3/10 secure chat, message has been left ?

## 2021-04-18 ENCOUNTER — Inpatient Hospital Stay: Payer: Medicare Other

## 2021-04-18 ENCOUNTER — Other Ambulatory Visit: Payer: Medicare Other

## 2021-04-18 ENCOUNTER — Ambulatory Visit: Payer: Medicare Other | Admitting: Physician Assistant

## 2021-04-19 DIAGNOSIS — H401123 Primary open-angle glaucoma, left eye, severe stage: Secondary | ICD-10-CM | POA: Diagnosis not present

## 2021-04-19 DIAGNOSIS — H401112 Primary open-angle glaucoma, right eye, moderate stage: Secondary | ICD-10-CM | POA: Diagnosis not present

## 2021-04-24 DIAGNOSIS — I10 Essential (primary) hypertension: Secondary | ICD-10-CM | POA: Diagnosis not present

## 2021-04-24 DIAGNOSIS — I700 Atherosclerosis of aorta: Secondary | ICD-10-CM | POA: Diagnosis not present

## 2021-04-24 DIAGNOSIS — J449 Chronic obstructive pulmonary disease, unspecified: Secondary | ICD-10-CM | POA: Diagnosis not present

## 2021-04-24 DIAGNOSIS — E785 Hyperlipidemia, unspecified: Secondary | ICD-10-CM | POA: Diagnosis not present

## 2021-04-24 DIAGNOSIS — M5136 Other intervertebral disc degeneration, lumbar region: Secondary | ICD-10-CM | POA: Diagnosis not present

## 2021-04-25 ENCOUNTER — Other Ambulatory Visit: Payer: Self-pay

## 2021-04-25 ENCOUNTER — Inpatient Hospital Stay: Payer: Medicare Other | Attending: Internal Medicine

## 2021-04-25 ENCOUNTER — Inpatient Hospital Stay (HOSPITAL_BASED_OUTPATIENT_CLINIC_OR_DEPARTMENT_OTHER): Payer: Medicare Other | Admitting: Physician Assistant

## 2021-04-25 VITALS — BP 147/69 | HR 71 | Temp 97.3°F | Resp 18 | Wt 183.5 lb

## 2021-04-25 DIAGNOSIS — G8929 Other chronic pain: Secondary | ICD-10-CM | POA: Diagnosis not present

## 2021-04-25 DIAGNOSIS — Z809 Family history of malignant neoplasm, unspecified: Secondary | ICD-10-CM | POA: Diagnosis not present

## 2021-04-25 DIAGNOSIS — Z87891 Personal history of nicotine dependence: Secondary | ICD-10-CM | POA: Diagnosis not present

## 2021-04-25 DIAGNOSIS — Z79899 Other long term (current) drug therapy: Secondary | ICD-10-CM | POA: Diagnosis not present

## 2021-04-25 DIAGNOSIS — Z808 Family history of malignant neoplasm of other organs or systems: Secondary | ICD-10-CM | POA: Diagnosis not present

## 2021-04-25 DIAGNOSIS — M549 Dorsalgia, unspecified: Secondary | ICD-10-CM | POA: Diagnosis not present

## 2021-04-25 DIAGNOSIS — G629 Polyneuropathy, unspecified: Secondary | ICD-10-CM | POA: Diagnosis not present

## 2021-04-25 DIAGNOSIS — D472 Monoclonal gammopathy: Secondary | ICD-10-CM | POA: Insufficient documentation

## 2021-04-25 LAB — CMP (CANCER CENTER ONLY)
ALT: 15 U/L (ref 0–44)
AST: 20 U/L (ref 15–41)
Albumin: 4.1 g/dL (ref 3.5–5.0)
Alkaline Phosphatase: 79 U/L (ref 38–126)
Anion gap: 8 (ref 5–15)
BUN: 26 mg/dL — ABNORMAL HIGH (ref 8–23)
CO2: 22 mmol/L (ref 22–32)
Calcium: 9.7 mg/dL (ref 8.9–10.3)
Chloride: 100 mmol/L (ref 98–111)
Creatinine: 1.05 mg/dL — ABNORMAL HIGH (ref 0.44–1.00)
GFR, Estimated: 52 mL/min — ABNORMAL LOW (ref >60)
Glucose, Bld: 94 mg/dL (ref 70–99)
Potassium: 4 mmol/L (ref 3.5–5.1)
Sodium: 130 mmol/L — ABNORMAL LOW (ref 135–145)
Total Bilirubin: 0.7 mg/dL (ref 0.3–1.2)
Total Protein: 7.1 g/dL (ref 6.5–8.1)

## 2021-04-25 LAB — CBC WITH DIFFERENTIAL (CANCER CENTER ONLY)
Abs Immature Granulocytes: 0.01 10*3/uL (ref 0.00–0.07)
Basophils Absolute: 0.1 10*3/uL (ref 0.0–0.1)
Basophils Relative: 1 %
Eosinophils Absolute: 0.2 10*3/uL (ref 0.0–0.5)
Eosinophils Relative: 5 %
HCT: 37.5 % (ref 36.0–46.0)
Hemoglobin: 13.3 g/dL (ref 12.0–15.0)
Immature Granulocytes: 0 %
Lymphocytes Relative: 27 %
Lymphs Abs: 1.2 10*3/uL (ref 0.7–4.0)
MCH: 32.5 pg (ref 26.0–34.0)
MCHC: 35.5 g/dL (ref 30.0–36.0)
MCV: 91.7 fL (ref 80.0–100.0)
Monocytes Absolute: 0.4 10*3/uL (ref 0.1–1.0)
Monocytes Relative: 9 %
Neutro Abs: 2.7 10*3/uL (ref 1.7–7.7)
Neutrophils Relative %: 58 %
Platelet Count: 196 10*3/uL (ref 150–400)
RBC: 4.09 MIL/uL (ref 3.87–5.11)
RDW: 12.2 % (ref 11.5–15.5)
WBC Count: 4.6 10*3/uL (ref 4.0–10.5)
nRBC: 0 % (ref 0.0–0.2)

## 2021-04-25 NOTE — Progress Notes (Signed)
?[facility] ?[phone]   [phone] ? ?HEMATOLOGY AND ONCOLOGY PROGRESS NOTE ? ?Patient Care Team: ?Donnajean Lopes, MD as PCP - General (Internal Medicine) ?Laurence Spates, MD (Inactive) (Gastroenterology) ?Mcarthur Rossetti, MD (Orthopedic Surgery) ?Erline Levine, MD (Neurosurgery) ?Teofilo Pod, MD (Ophthalmology) ?Petrinitz, Dellis Filbert, DPM (Inactive) (Podiatry) ?Nunzio Cobbs, MD (Obstetrics and Gynecology) ?Rolm Bookbinder, MD (Dermatology) ?Shon Hough, MD (Ophthalmology) ? ?Hematological/Oncological History ?1) 06/07/2020: Labs from [facility] ?- SPEP revealed m-protein 0.2. 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 ? ?2) 06/22/2020: Establish care with [name] ? ?CHIEF COMPLAINTS/PURPOSE OF CONSULTATION:  ?"MGUS" ? ?HISTORY OF PRESENTING ILLNESS:  ?[name] 86 y.o. female returns for a routine follow up for MGUS. Patient is unaccompanied for this visit.  ? ?On exam today, [name] reports chronic fatigue but she continues to complete all her daily activities on her own.  She reports losing approximately 10 pounds that was intentional due to diet modifications.  She denies any nausea, vomiting or abdominal pain.  Her bowel habits are regular without any diarrhea or constipation.  Patient reports chronic low back pain that has been present for several years.  She has persistent neuropathy in her left hand and bilateral feet.  She is currently on gabapentin 300 mg nightly.  She denies easy bruising or signs of active bleeding.  Patient denies fevers, chills, night sweats, shortness of breath, chest pain or cough.  She has no other complaints.She has no other complaints. Rest of the 10 point ROS is below.   ? ?MEDICAL HISTORY:  ?Past Medical History:  ?Diagnosis Date  ? Arthritis   ? Bilateral edema of lower extremity   ? CKD (chronic kidney disease)   ? Constipation   ? Degenerative arthritis of  hip   ? s/p THR 05/2011  ? Depression   ? Glaucoma   ? Headache(784.0)   ? HLD (hyperlipidemia)   ? Hypertension   ? OAB (overactive bladder)   ? Osteoporosis   ? Primary osteoarthritis of right knee   ? Slow transit constipation   ? Unsteady gait   ? Venous insufficiency   ? ? ?SURGICAL HISTORY: ?Past Surgical History:  ?Procedure Laterality Date  ?  cataracts removed  2013 L  ? ABDOMINAL HYSTERECTOMY  2003  ? BREAST BIOPSY  1960  ? BREAST CYSTS    ? X2 BENIGN  ? BREAST EXCISIONAL BIOPSY Left   ? BREAST SURGERY    ? ENDOVENOUS ABLATION SAPHENOUS VEIN W/ LASER Left 06/04/2019  ? endovenous laser ablation left greater saphenous vein by Gae Gallop MD   ? GLAUCOMA VALVE INSERTION Bilateral 2013, 2014  ? bond ([facility])  ? REDUCTION MAMMAPLASTY Bilateral 2000  ? TOTAL HIP ARTHROPLASTY  06/01/2011  ? Procedure: TOTAL HIP ARTHROPLASTY ANTERIOR APPROACH;  Surgeon: Mcarthur Rossetti, MD;  Location: [facility];  Service: Orthopedics;  Laterality: Left;  Left Total Hip Replacement, Direct Anterior Approach  ? TOTAL KNEE ARTHROPLASTY Right 03/14/2015  ? Procedure: TOTAL KNEE ARTHROPLASTY;  Surgeon: Gaynelle Arabian, MD;  Location: [facility];  Service: Orthopedics;  Laterality: Right;  ? ? ?SOCIAL HISTORY: ?Social History  ? ?Socioeconomic History  ? Marital status: Divorced  ?  Spouse name: Not on file  ? Number of children: Not on file  ? Years of education: Not on file  ? Highest education level: Not on file  ?Occupational History  ? Not on file  ?Tobacco Use  ?  Smoking status: Former  ?  Packs/day: 1.00  ?  Years: 35.00  ?  Pack years: 35.00  ?  Types: Cigarettes  ?  Quit date: 05/27/1980  ?  Years since quitting: 40.9  ? Smokeless tobacco: Never  ?Vaping Use  ? Vaping Use: Never used  ?Substance and Sexual Activity  ? Alcohol use: No  ?  Alcohol/week: 0.0 standard drinks  ? Drug use: No  ? Sexual activity: Not on file  ?Other Topics Concern  ? Not on file  ?Social History Narrative  ? Lives alone in [place] at [place].  One child,  [name].  Education: college.  ? ?Social Determinants of Health  ? ?Financial Resource Strain: Not on file  ?Food Insecurity: Not on file  ?Transportation Needs: Not on file  ?Physical Activity: Not on file  ?Stress: Not on file  ?Social Connections: Not on file  ?Intimate Partner Violence: Not on file  ? ? ?FAMILY HISTORY: ?Family History  ?Problem Relation Age of Onset  ? Cancer Mother   ? Alcohol abuse Father   ? Emphysema Father   ? Stroke Brother   ? Melanoma Brother   ? ? ?ALLERGIES:  has No Known Allergies. ? ?MEDICATIONS:  ?Current Outpatient Medications  ?Medication Sig Dispense Refill  ? brimonidine (ALPHAGAN) 0.2 % ophthalmic solution Place 1 drop into the left eye 3 (three) times daily.    ? carvedilol (COREG) 25 MG tablet Take 25 mg by mouth 2 (two) times daily.    ? dorzolamidel-timolol (COSOPT) 22.3-6.8 MG/ML SOLN ophthalmic solution Place 1 drop into both eyes 2 (two) times daily.    ? furosemide (LASIX) 40 MG tablet Take 40 mg by mouth daily.    ? gabapentin (NEURONTIN) 300 MG capsule Take 1 capsule (300 mg total) by mouth at bedtime. 30 capsule 1  ? latanoprost (XALATAN) 0.005 % ophthalmic solution Place 1 drop into the left eye nightly.    ? lisinopril (PRINIVIL,ZESTRIL) 30 MG tablet take 1 tablet by mouth once daily 90 tablet 1  ? losartan (COZAAR) 100 MG tablet Take 100 mg by mouth daily.    ? timolol (TIMOPTIC) 0.5 % ophthalmic solution Place 1 drop into both eyes 2 (two) times daily.    ? ANORO ELLIPTA 62.5-25 MCG/INH AEPB Inhale 1 puff into the lungs daily as needed (copd). (Patient not taking: Reported on 09/21/2020)    ? ?No current facility-administered medications for this visit.  ? ? ?REVIEW OF SYSTEMS:   ?Constitutional: ( - ) fevers, ( - )  chills , ( - ) night sweats ?Eyes: ( - ) blurriness of vision, ( - ) double vision, ( - ) watery eyes ?Ears, nose, mouth, throat, and face: ( - ) mucositis, ( - ) sore throat ?Respiratory: ( - ) cough, ( - ) dyspnea, ( - ) wheezes ?Cardiovascular: ( -  ) palpitation, ( - ) chest discomfort, ( - ) lower extremity swelling ?Gastrointestinal:  ( - ) nausea, ( - ) heartburn, ( - ) change in bowel habits ?Skin: ( - ) abnormal skin rashes ?Lymphatics: ( - ) new lymphadenopathy, ( - ) easy bruising ?Neurological: ( + ) numbness, ( + ) tingling, ( - ) new weaknesses ?Behavioral/Psych: ( - ) mood change, ( - ) new changes  ?All other systems were reviewed with the patient and are negative. ? ?PHYSICAL EXAMINATION: ?ECOG PERFORMANCE STATUS: 1 - Symptomatic but completely ambulatory ? ?Vitals:  ? 04/25/21 1354  ?BP: (!) 147/69  ?Pulse: 71  ?  Resp: 18  ?Temp: (!) 97.3 ?F (36.3 ?C)  ?SpO2: 95%  ? ?Filed Weights  ? 04/25/21 1354  ?Weight: 183 lb 8 oz (83.2 kg)  ? ? ?GENERAL: well appearing female in NAD, obese  ?SKIN: skin color, texture, turgor are normal, no rashes or significant lesions. Hyperpigmentation of bilateral lower extremities.  ?EYES: conjunctiva are pink and non-injected, sclera clear ?LUNGS: clear to auscultation and percussion with normal breathing effort ?HEART: regular rate & rhythm and no murmurs. Bilateral lower extremity edema.  ?Musculoskeletal: no cyanosis of digits and no clubbing  ?PSYCH: alert & oriented x 3, fluent speech ?NEURO: no focal motor/sensory deficits ? ?LABORATORY DATA:  ?I have reviewed the data as listed ? ?  Latest Ref Rng & Units 04/25/2021  ?  1:34 PM 09/21/2020  ?  2:05 PM 06/22/2020  ?  3:41 PM  ?CBC  ?WBC 4.0 - 10.5 K/uL 4.6   4.6   4.3    ?Hemoglobin 12.0 - 15.0 g/dL 13.3   12.7   13.2    ?Hematocrit 36.0 - 46.0 % 37.5   35.8   37.6    ?Platelets 150 - 400 K/uL 196   206   179    ? ? ? ?  Latest Ref Rng & Units 04/25/2021  ?  1:34 PM 09/21/2020  ?  2:05 PM 06/22/2020  ?  3:41 PM  ?CMP  ?Glucose 70 - 99 mg/dL 94   102   81    ?BUN 8 - 23 mg/dL '26   23   16    '$ ?Creatinine 0.44 - 1.00 mg/dL 1.05   1.23   1.05    ?Sodium 135 - 145 mmol/L 130   138   141    ?Potassium 3.5 - 5.1 mmol/L 4.0   4.0   4.1    ?Chloride 98 - 111 mmol/L 100   107    108    ?CO2 22 - 32 mmol/L '22   23   25    '$ ?Calcium 8.9 - 10.3 mg/dL 9.7   9.4   9.1    ?Total Protein 6.5 - 8.1 g/dL 7.1   6.9   7.1    ?Total Bilirubin 0.3 - 1.2 mg/dL 0.7   0.6   0.6    ?Alkaline Phos 38

## 2021-04-26 LAB — KAPPA/LAMBDA LIGHT CHAINS
Kappa free light chain: 37.2 mg/L — ABNORMAL HIGH (ref 3.3–19.4)
Kappa, lambda light chain ratio: 3.26 — ABNORMAL HIGH (ref 0.26–1.65)
Lambda free light chains: 11.4 mg/L (ref 5.7–26.3)

## 2021-04-28 LAB — MULTIPLE MYELOMA PANEL, SERUM
Albumin SerPl Elph-Mcnc: 3.6 g/dL (ref 2.9–4.4)
Albumin/Glob SerPl: 1.3 (ref 0.7–1.7)
Alpha 1: 0.2 g/dL (ref 0.0–0.4)
Alpha2 Glob SerPl Elph-Mcnc: 0.7 g/dL (ref 0.4–1.0)
B-Globulin SerPl Elph-Mcnc: 0.9 g/dL (ref 0.7–1.3)
Gamma Glob SerPl Elph-Mcnc: 1.1 g/dL (ref 0.4–1.8)
Globulin, Total: 2.9 g/dL (ref 2.2–3.9)
IgA: 87 mg/dL (ref 64–422)
IgG (Immunoglobin G), Serum: 1309 mg/dL (ref 586–1602)
IgM (Immunoglobulin M), Srm: 56 mg/dL (ref 26–217)
M Protein SerPl Elph-Mcnc: 0.7 g/dL — ABNORMAL HIGH
Total Protein ELP: 6.5 g/dL (ref 6.0–8.5)

## 2021-05-01 ENCOUNTER — Encounter: Payer: Self-pay | Admitting: Physician Assistant

## 2021-06-06 DIAGNOSIS — H04123 Dry eye syndrome of bilateral lacrimal glands: Secondary | ICD-10-CM | POA: Diagnosis not present

## 2021-06-06 DIAGNOSIS — H401133 Primary open-angle glaucoma, bilateral, severe stage: Secondary | ICD-10-CM | POA: Diagnosis not present

## 2021-06-06 DIAGNOSIS — Z961 Presence of intraocular lens: Secondary | ICD-10-CM | POA: Diagnosis not present

## 2021-07-04 DIAGNOSIS — H04123 Dry eye syndrome of bilateral lacrimal glands: Secondary | ICD-10-CM | POA: Diagnosis not present

## 2021-07-04 DIAGNOSIS — H401133 Primary open-angle glaucoma, bilateral, severe stage: Secondary | ICD-10-CM | POA: Diagnosis not present

## 2021-07-04 DIAGNOSIS — Z961 Presence of intraocular lens: Secondary | ICD-10-CM | POA: Diagnosis not present

## 2021-09-04 DIAGNOSIS — H401133 Primary open-angle glaucoma, bilateral, severe stage: Secondary | ICD-10-CM | POA: Diagnosis not present

## 2021-09-04 DIAGNOSIS — Z961 Presence of intraocular lens: Secondary | ICD-10-CM | POA: Diagnosis not present

## 2021-09-04 DIAGNOSIS — H04123 Dry eye syndrome of bilateral lacrimal glands: Secondary | ICD-10-CM | POA: Diagnosis not present

## 2021-09-21 ENCOUNTER — Telehealth: Payer: Self-pay | Admitting: Physician Assistant

## 2021-09-21 NOTE — Telephone Encounter (Signed)
Contacted patient to scheduled appointments. Left message with appointment details and a call back number if patient had any questions or could not accommodate the time we provided.   

## 2021-10-24 ENCOUNTER — Other Ambulatory Visit: Payer: Medicare Other

## 2021-10-24 ENCOUNTER — Ambulatory Visit: Payer: Medicare Other | Admitting: Physician Assistant

## 2021-10-25 ENCOUNTER — Ambulatory Visit: Payer: Medicare Other | Admitting: Physician Assistant

## 2021-10-25 ENCOUNTER — Other Ambulatory Visit: Payer: Medicare Other

## 2021-10-25 ENCOUNTER — Inpatient Hospital Stay: Payer: Medicare Other | Attending: Physician Assistant

## 2021-10-25 ENCOUNTER — Other Ambulatory Visit: Payer: Self-pay

## 2021-10-25 ENCOUNTER — Inpatient Hospital Stay (HOSPITAL_BASED_OUTPATIENT_CLINIC_OR_DEPARTMENT_OTHER): Payer: Medicare Other | Admitting: Physician Assistant

## 2021-10-25 VITALS — BP 179/79 | HR 64 | Temp 97.2°F | Resp 15 | Wt 178.7 lb

## 2021-10-25 DIAGNOSIS — G629 Polyneuropathy, unspecified: Secondary | ICD-10-CM | POA: Insufficient documentation

## 2021-10-25 DIAGNOSIS — N189 Chronic kidney disease, unspecified: Secondary | ICD-10-CM | POA: Insufficient documentation

## 2021-10-25 DIAGNOSIS — Z809 Family history of malignant neoplasm, unspecified: Secondary | ICD-10-CM | POA: Diagnosis not present

## 2021-10-25 DIAGNOSIS — Z808 Family history of malignant neoplasm of other organs or systems: Secondary | ICD-10-CM | POA: Diagnosis not present

## 2021-10-25 DIAGNOSIS — D472 Monoclonal gammopathy: Secondary | ICD-10-CM

## 2021-10-25 DIAGNOSIS — Z87891 Personal history of nicotine dependence: Secondary | ICD-10-CM | POA: Diagnosis not present

## 2021-10-25 LAB — CBC WITH DIFFERENTIAL (CANCER CENTER ONLY)
Abs Immature Granulocytes: 0.02 10*3/uL (ref 0.00–0.07)
Basophils Absolute: 0.1 10*3/uL (ref 0.0–0.1)
Basophils Relative: 1 %
Eosinophils Absolute: 0.4 10*3/uL (ref 0.0–0.5)
Eosinophils Relative: 6 %
HCT: 39 % (ref 36.0–46.0)
Hemoglobin: 14 g/dL (ref 12.0–15.0)
Immature Granulocytes: 0 %
Lymphocytes Relative: 28 %
Lymphs Abs: 1.6 10*3/uL (ref 0.7–4.0)
MCH: 33.5 pg (ref 26.0–34.0)
MCHC: 35.9 g/dL (ref 30.0–36.0)
MCV: 93.3 fL (ref 80.0–100.0)
Monocytes Absolute: 0.4 10*3/uL (ref 0.1–1.0)
Monocytes Relative: 7 %
Neutro Abs: 3.2 10*3/uL (ref 1.7–7.7)
Neutrophils Relative %: 58 %
Platelet Count: 214 10*3/uL (ref 150–400)
RBC: 4.18 MIL/uL (ref 3.87–5.11)
RDW: 12.2 % (ref 11.5–15.5)
WBC Count: 5.6 10*3/uL (ref 4.0–10.5)
nRBC: 0 % (ref 0.0–0.2)

## 2021-10-25 LAB — CMP (CANCER CENTER ONLY)
ALT: 16 U/L (ref 0–44)
AST: 21 U/L (ref 15–41)
Albumin: 4.2 g/dL (ref 3.5–5.0)
Alkaline Phosphatase: 87 U/L (ref 38–126)
Anion gap: 6 (ref 5–15)
BUN: 15 mg/dL (ref 8–23)
CO2: 25 mmol/L (ref 22–32)
Calcium: 9.9 mg/dL (ref 8.9–10.3)
Chloride: 107 mmol/L (ref 98–111)
Creatinine: 1.04 mg/dL — ABNORMAL HIGH (ref 0.44–1.00)
GFR, Estimated: 53 mL/min — ABNORMAL LOW (ref >60)
Glucose, Bld: 86 mg/dL (ref 70–99)
Potassium: 4 mmol/L (ref 3.5–5.1)
Sodium: 138 mmol/L (ref 135–145)
Total Bilirubin: 0.9 mg/dL (ref 0.3–1.2)
Total Protein: 7.8 g/dL (ref 6.5–8.1)

## 2021-10-25 LAB — LACTATE DEHYDROGENASE: LDH: 180 U/L (ref 98–192)

## 2021-10-26 LAB — KAPPA/LAMBDA LIGHT CHAINS
Kappa free light chain: 37.2 mg/L — ABNORMAL HIGH (ref 3.3–19.4)
Kappa, lambda light chain ratio: 2.64 — ABNORMAL HIGH (ref 0.26–1.65)
Lambda free light chains: 14.1 mg/L (ref 5.7–26.3)

## 2021-10-26 NOTE — Progress Notes (Signed)
[facility] [phone]   [phone]  HEMATOLOGY AND ONCOLOGY PROGRESS NOTE  Patient Care Team: Paterson, Daniel G, MD as PCP - General (Internal Medicine) Edwards, James, MD (Inactive) (Gastroenterology) Blackman, Christopher Y, MD (Orthopedic Surgery) Stern, Joseph, MD (Neurosurgery) Bond, John B, MD (Ophthalmology) Petrinitz, Jeffrey, DPM (Inactive) (Podiatry) Amundson C Silva, Brook E, MD (Obstetrics and Gynecology) Lomax, Laura, MD (Dermatology) Stoneburner, Sara, MD (Ophthalmology)  Hematological/Oncological History 1) 06/07/2020: Labs from [facility] - SPEP revealed m-protein 0.2. 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  2) 06/22/2020: Establish care with Irene Thayil PA-C  CHIEF COMPLAINTS/PURPOSE OF CONSULTATION:  "MGUS"  HISTORY OF PRESENTING ILLNESS:  [name] 86 y.o. female returns for a routine follow up for MGUS. She was seen last on 04/25/2021 without any changes to her health. Patient is unaccompanied for this visit.   On exam today, [name] reports she continues to have fatigue that is chronic in nature. She does continues to complete her ADLs independently without any difficulty. She has lost another five pounds which was intentional with dieting. She denies any nausea, vomiting or abdominal pain. Her bowel habits are unchanged. She has chronic low back pain that has been present for several years.  She has persistent neuropathy in her hands and bilateral feet.  She is currently on gabapentin 300 mg nightly.  She denies easy bruising or signs of active bleeding.  Patient denies fevers, chills, night sweats, shortness of breath, chest pain or cough.  She has no other complaints.She has no other complaints. Rest of the 10 point ROS is below.    MEDICAL HISTORY:  Past Medical History:  Diagnosis Date   Arthritis    Bilateral edema of lower extremity    CKD (chronic kidney disease)     Constipation    Degenerative arthritis of hip    s/p THR 05/2011   Depression    Glaucoma    Headache(784.0)    HLD (hyperlipidemia)    Hypertension    OAB (overactive bladder)    Osteoporosis    Primary osteoarthritis of right knee    Slow transit constipation    Unsteady gait    Venous insufficiency     SURGICAL HISTORY: Past Surgical History:  Procedure Laterality Date    cataracts removed  2013 L   ABDOMINAL HYSTERECTOMY  2003   BREAST BIOPSY  1960   BREAST CYSTS     X2 BENIGN   BREAST EXCISIONAL BIOPSY Left    BREAST SURGERY     ENDOVENOUS ABLATION SAPHENOUS VEIN W/ LASER Left 06/04/2019   endovenous laser ablation left greater saphenous vein by Chris Dickson MD    GLAUCOMA VALVE INSERTION Bilateral 2013, 2014   bond ([facility])   REDUCTION MAMMAPLASTY Bilateral 2000   TOTAL HIP ARTHROPLASTY  06/01/2011   Procedure: TOTAL HIP ARTHROPLASTY ANTERIOR APPROACH;  Surgeon: Christopher Y Blackman, MD;  Location: [facility];  Service: Orthopedics;  Laterality: Left;  Left Total Hip Replacement, Direct Anterior Approach   TOTAL KNEE ARTHROPLASTY Right 03/14/2015   Procedure: TOTAL KNEE ARTHROPLASTY;  Surgeon: Frank Aluisio, MD;  Location: [facility];  Service: Orthopedics;  Laterality: Right;    SOCIAL HISTORY: Social History   Socioeconomic History   Marital status: Divorced    Spouse name: Not on file   Number of children: Not on file   Years of education: Not on file   Highest education level: Not on file  Occupational History   Not   on file  Tobacco Use   Smoking status: Former    Packs/day: 1.00    Years: 35.00    Total pack years: 35.00    Types: Cigarettes    Quit date: 05/27/1980    Years since quitting: 41.4   Smokeless tobacco: Never  Vaping Use   Vaping Use: Never used  Substance and Sexual Activity   Alcohol use: No    Alcohol/week: 0.0 standard drinks of alcohol   Drug use: No   Sexual activity: Not on file  Other Topics Concern   Not on file  Social History  Narrative   Lives alone in [place] at [place].  One child, [name].  Education: college.   Social Determinants of Health   Financial Resource Strain: Not on file  Food Insecurity: Not on file  Transportation Needs: Not on file  Physical Activity: Not on file  Stress: Not on file  Social Connections: Not on file  Intimate Partner Violence: Not on file    FAMILY HISTORY: Family History  Problem Relation Age of Onset   Cancer Mother    Alcohol abuse Father    Emphysema Father    Stroke Brother    Melanoma Brother     ALLERGIES:  has No Known Allergies.  MEDICATIONS:  Current Outpatient Medications  Medication Sig Dispense Refill   brimonidine (ALPHAGAN) 0.2 % ophthalmic solution Place 1 drop into the left eye 3 (three) times daily.     carvedilol (COREG) 25 MG tablet Take 25 mg by mouth 2 (two) times daily.     dorzolamidel-timolol (COSOPT) 22.3-6.8 MG/ML SOLN ophthalmic solution Place 1 drop into both eyes 2 (two) times daily.     furosemide (LASIX) 40 MG tablet Take 40 mg by mouth daily.     gabapentin (NEURONTIN) 300 MG capsule Take 1 capsule (300 mg total) by mouth at bedtime. 30 capsule 1   latanoprost (XALATAN) 0.005 % ophthalmic solution Place 1 drop into the left eye nightly.     lisinopril (PRINIVIL,ZESTRIL) 30 MG tablet take 1 tablet by mouth once daily 90 tablet 1   losartan (COZAAR) 100 MG tablet Take 100 mg by mouth daily.     timolol (TIMOPTIC) 0.5 % ophthalmic solution Place 1 drop into both eyes 2 (two) times daily.     No current facility-administered medications for this visit.    REVIEW OF SYSTEMS:   Constitutional: ( - ) fevers, ( - )  chills , ( - ) night sweats Eyes: ( - ) blurriness of vision, ( - ) double vision, ( - ) watery eyes Ears, nose, mouth, throat, and face: ( - ) mucositis, ( - ) sore throat Respiratory: ( - ) cough, ( - ) dyspnea, ( - ) wheezes Cardiovascular: ( - ) palpitation, ( - ) chest discomfort, ( - ) lower extremity  swelling Gastrointestinal:  ( - ) nausea, ( - ) heartburn, ( - ) change in bowel habits Skin: ( - ) abnormal skin rashes Lymphatics: ( - ) new lymphadenopathy, ( - ) easy bruising Neurological: ( + ) numbness, ( + ) tingling, ( - ) new weaknesses Behavioral/Psych: ( - ) mood change, ( - ) new changes  All other systems were reviewed with the patient and are negative.  PHYSICAL EXAMINATION: ECOG PERFORMANCE STATUS: 1 - Symptomatic but completely ambulatory  Vitals:   10/25/21 1512  BP: (!) 179/79  Pulse: 64  Resp: 15  Temp: (!) 97.2 F (36.2 C)  SpO2: 94%  Filed Weights   10/25/21 1512  Weight: 178 lb 11.2 oz (81.1 kg)    GENERAL: well appearing female in NAD, obese  SKIN: skin color, texture, turgor are normal, no rashes or significant lesions. Hyperpigmentation of bilateral lower extremities.  EYES: conjunctiva are pink and non-injected, sclera clear LUNGS: clear to auscultation and percussion with normal breathing effort HEART: regular rate & rhythm and no murmurs. Bilateral lower extremity edema.  Musculoskeletal: no cyanosis of digits and no clubbing  PSYCH: alert & oriented x 3, fluent speech NEURO: no focal motor/sensory deficits  LABORATORY DATA:  I have reviewed the data as listed    Latest Ref Rng & Units 10/25/2021    2:39 PM 04/25/2021    1:34 PM 09/21/2020    2:05 PM  CBC  WBC 4.0 - 10.5 K/uL 5.6  4.6  4.6   Hemoglobin 12.0 - 15.0 g/dL 14.0  13.3  12.7   Hematocrit 36.0 - 46.0 % 39.0  37.5  35.8   Platelets 150 - 400 K/uL 214  196  206        Latest Ref Rng & Units 10/25/2021    2:39 PM 04/25/2021    1:34 PM 09/21/2020    2:05 PM  CMP  Glucose 70 - 99 mg/dL 86  94  102   BUN 8 - 23 mg/dL _0 Creatinine 0.44 - 1.00 mg/dL 1.04  1.05  1.23   Sodium 135 - 145 mmol/L 138  130  138   Potassium 3.5 - 5.1 mmol/L 4.0  4.0  4.0   Chloride 98 - 111 mmol/L 107  100  107   CO2 22 - 32 mmol/L _1 Calcium 8.9 - 10.3 mg/dL 9.9  9.7  9.4   Total  Protein 6.5 - 8.1 g/dL 7.8  7.1  6.9   Total Bilirubin 0.3 - 1.2 mg/dL 0.9  0.7  0.6   Alkaline Phos 38 - 126 U/L 87  79  112   AST 15 - 41 U/L _2 ALT 0 - 44 U/L _3 ASSESSMENT & PLAN Chrissi EVEA SHEEK is a 86 y.o. female presenting presenting to the clinic for MGUS.    #MGUS: --Labs from 09/21/2020 showed SPEP with M protein of 0.8. IFE showed IgG monoclonal protein with kappa light chain specificity. UPEP was unremarkable. Kappa free light chain was elevated at 44 and kappa/lambda ration was elevated at 2.95 in the setting of chronic kidney disease.  --DG bone met survey from 06/30/2020 did not show lytic lesions. Next due now.  --Labs today show CBC was unremarkable, CMP revealed stable creatinine level of 1.04. SPEP/IFE, serum free light chains and 24 hour UPEP are pending.  --RTC in 6 months unless pending labs require further workup.    No orders of the defined types were placed in this encounter.    All questions were answered. The patient knows to call the clinic with any problems, questions or concerns.  I have spent a total of 25 minutes minutes of face-to-face and non-face-to-face time, preparing to see the patient, performing a medically appropriate examination, counseling and educating the patient, ordering tests, documenting clinical information in the electronic health record and care coordination.   Dede Query, PA-C Department of Hematology/Oncology [facility] Phone: [phone]

## 2021-10-30 DIAGNOSIS — L821 Other seborrheic keratosis: Secondary | ICD-10-CM | POA: Diagnosis not present

## 2021-10-30 DIAGNOSIS — D485 Neoplasm of uncertain behavior of skin: Secondary | ICD-10-CM | POA: Diagnosis not present

## 2021-10-30 DIAGNOSIS — D225 Melanocytic nevi of trunk: Secondary | ICD-10-CM | POA: Diagnosis not present

## 2021-10-30 LAB — MULTIPLE MYELOMA PANEL, SERUM
Albumin SerPl Elph-Mcnc: 4 g/dL (ref 2.9–4.4)
Albumin/Glob SerPl: 1.4 (ref 0.7–1.7)
Alpha 1: 0.3 g/dL (ref 0.0–0.4)
Alpha2 Glob SerPl Elph-Mcnc: 0.7 g/dL (ref 0.4–1.0)
B-Globulin SerPl Elph-Mcnc: 0.9 g/dL (ref 0.7–1.3)
Gamma Glob SerPl Elph-Mcnc: 1.2 g/dL (ref 0.4–1.8)
Globulin, Total: 3 g/dL (ref 2.2–3.9)
IgA: 83 mg/dL (ref 64–422)
IgG (Immunoglobin G), Serum: 1329 mg/dL (ref 586–1602)
IgM (Immunoglobulin M), Srm: 47 mg/dL (ref 26–217)
M Protein SerPl Elph-Mcnc: 0.8 g/dL — ABNORMAL HIGH
Total Protein ELP: 7 g/dL (ref 6.0–8.5)

## 2021-10-31 ENCOUNTER — Telehealth: Payer: Self-pay

## 2021-10-31 NOTE — Telephone Encounter (Signed)
LM for pt with lab results and reminder for appt 10/4 for her bone survey and to bring her 24 hr urine.

## 2021-10-31 NOTE — Telephone Encounter (Signed)
-----  Message from Lincoln Brigham, PA-C sent at 10/31/2021  2:45 PM EDT ----- Please notify patient that M protein is stable and we can continue to monitor. Please schedule bone met survey and remind her to bring in the 24 hour urine sample

## 2021-11-01 ENCOUNTER — Other Ambulatory Visit: Payer: Self-pay

## 2021-11-01 ENCOUNTER — Ambulatory Visit: Payer: Medicare Other | Admitting: Physician Assistant

## 2021-11-01 ENCOUNTER — Other Ambulatory Visit: Payer: Medicare Other

## 2021-11-01 ENCOUNTER — Ambulatory Visit (HOSPITAL_COMMUNITY)
Admission: RE | Admit: 2021-11-01 | Discharge: 2021-11-01 | Disposition: A | Discharge status: Home / Self Care | Payer: Medicare Other | Source: Ambulatory Visit | Attending: Physician Assistant | Admitting: Physician Assistant

## 2021-11-01 DIAGNOSIS — D472 Monoclonal gammopathy: Secondary | ICD-10-CM

## 2021-11-03 LAB — UPEP/UIFE/LIGHT CHAINS/TP, 24-HR UR
% BETA, Urine: 0 %
ALPHA 1 URINE: 0 %
Albumin, U: 100 %
Alpha 2, Urine: 0 %
Free Kappa Lt Chains,Ur: 11.83 mg/L (ref 1.17–86.46)
Free Kappa/Lambda Ratio: 12.32 (ref 1.83–14.26)
Free Lambda Lt Chains,Ur: 0.96 mg/L (ref 0.27–15.21)
GAMMA GLOBULIN URINE: 0 %
Total Protein, Urine-Ur/day: 59 mg/24 hr (ref 30–150)
Total Protein, Urine: 9.1 mg/dL
Total Volume: 650

## 2021-11-08 DIAGNOSIS — H5213 Myopia, bilateral: Secondary | ICD-10-CM | POA: Diagnosis not present

## 2021-11-08 DIAGNOSIS — H401134 Primary open-angle glaucoma, bilateral, indeterminate stage: Secondary | ICD-10-CM | POA: Diagnosis not present

## 2021-11-08 DIAGNOSIS — H524 Presbyopia: Secondary | ICD-10-CM | POA: Diagnosis not present

## 2021-11-09 ENCOUNTER — Telehealth: Payer: Self-pay

## 2021-11-09 NOTE — Telephone Encounter (Signed)
Contacted pt per Dede Query PAC: to let patient that there is no evidence of lytic lesions. Continue to monitor and see her back in 6 months as scheduled.  Pt acknowledged information and verbalized understanding.

## 2021-12-04 DIAGNOSIS — D225 Melanocytic nevi of trunk: Secondary | ICD-10-CM | POA: Diagnosis not present

## 2021-12-04 DIAGNOSIS — D485 Neoplasm of uncertain behavior of skin: Secondary | ICD-10-CM | POA: Diagnosis not present

## 2021-12-06 DIAGNOSIS — E785 Hyperlipidemia, unspecified: Secondary | ICD-10-CM | POA: Diagnosis not present

## 2021-12-06 DIAGNOSIS — I10 Essential (primary) hypertension: Secondary | ICD-10-CM | POA: Diagnosis not present

## 2021-12-06 DIAGNOSIS — R7989 Other specified abnormal findings of blood chemistry: Secondary | ICD-10-CM | POA: Diagnosis not present

## 2021-12-18 DIAGNOSIS — Z4802 Encounter for removal of sutures: Secondary | ICD-10-CM | POA: Diagnosis not present

## 2021-12-24 ENCOUNTER — Ambulatory Visit (HOSPITAL_COMMUNITY)
Admission: EM | Admit: 2021-12-24 | Discharge: 2021-12-24 | Disposition: A | Discharge status: Home / Self Care | Payer: Medicare Other | Attending: Internal Medicine | Admitting: Internal Medicine

## 2021-12-24 ENCOUNTER — Encounter (HOSPITAL_COMMUNITY): Payer: Self-pay | Admitting: Emergency Medicine

## 2021-12-24 DIAGNOSIS — L219 Seborrheic dermatitis, unspecified: Secondary | ICD-10-CM | POA: Diagnosis not present

## 2021-12-24 MED ORDER — SELENIUM SULFIDE 2.5 % EX LOTN
1.0000 | TOPICAL_LOTION | CUTANEOUS | 0 refills | Status: DC
Start: 2021-12-25 — End: 2023-07-18

## 2021-12-24 NOTE — ED Provider Notes (Signed)
[facility]    CSN: [id number] Arrival date & time: 12/24/21  1255      History   Chief Complaint Chief Complaint  Patient presents with   Pruritis    HPI [name] is a 86 y.o. female with a history of arthritis, CKD, glaucoma, HLD, HTN, osteoporosis and CVI presents to UC today with complaint of itching of her scalp.  She noticed this 3 months ago.  She reports her scalp itches and often keeps her up at night.  She asked her hairdresser to see if she could find a cause of her itchy scalp and her hairdresser reported that there was some bumps behind her left ear.  She only gets her hair washed once a month.  She denies changes in shampoos, conditioners or other hair products.  She was going to try Benadryl OTC but read that she should not take this with glaucoma.  HPI  Past Medical History:  Diagnosis Date   Arthritis    Bilateral edema of lower extremity    CKD (chronic kidney disease)    Constipation    Degenerative arthritis of hip    s/p THR 05/2011   Depression    Glaucoma    Headache(784.0)    HLD (hyperlipidemia)    Hypertension    OAB (overactive bladder)    Osteoporosis    Primary osteoarthritis of right knee    Slow transit constipation    Unsteady gait    Venous insufficiency     Patient Active Problem List   Diagnosis Date Noted   MGUS (monoclonal gammopathy of unknown significance) 06/23/2020   History of total hip arthroplasty, left 11/12/2018   Closed fracture of left distal radius 11/09/2016   Neck pain 08/20/2016   Pain in left hip 08/20/2016   History of left hip replacement 08/20/2016   OA (osteoarthritis) of knee 99/35/7017   Diastolic dysfunction 79/39/0300   LBBB (left bundle branch block) 07/19/2013   Obesity (BMI 30-39.9) 11/11/2012   Chronic diarrhea    Primary open angle glaucoma of left eye, severe stage 10/03/2012   Hypertension    OAB (overactive bladder)    Depression    Osteopenia    Symptoms referable to back  01/07/2012   Cervical spondylosis 01/07/2012   Degenerative arthritis of hip 06/01/2011   Cataract 11/17/2010   Glaucoma 11/17/2010    Past Surgical History:  Procedure Laterality Date    cataracts removed  2013 L   ABDOMINAL HYSTERECTOMY  2003   BREAST BIOPSY  1960   BREAST CYSTS     X2 BENIGN   BREAST EXCISIONAL BIOPSY Left    BREAST SURGERY     ENDOVENOUS ABLATION SAPHENOUS VEIN W/ LASER Left 06/04/2019   endovenous laser ablation left greater saphenous vein by Gae Gallop MD    GLAUCOMA VALVE INSERTION Bilateral 2013, 2014   bond ([facility])   REDUCTION MAMMAPLASTY Bilateral 2000   TOTAL HIP ARTHROPLASTY  06/01/2011   Procedure: TOTAL HIP ARTHROPLASTY ANTERIOR APPROACH;  Surgeon: Mcarthur Rossetti, MD;  Location: [facility];  Service: Orthopedics;  Laterality: Left;  Left Total Hip Replacement, Direct Anterior Approach   TOTAL KNEE ARTHROPLASTY Right 03/14/2015   Procedure: TOTAL KNEE ARTHROPLASTY;  Surgeon: Gaynelle Arabian, MD;  Location: [facility];  Service: Orthopedics;  Laterality: Right;    OB History   No obstetric history on file.      Home Medications    Prior to Admission medications   Medication Sig Start Date End  Date Taking? Authorizing Provider  selenium sulfide (SELSUN) 2.5 % shampoo Apply 1 Application topically 2 (two) times a week. 12/25/21  Yes Maisy Newport, Coralie Keens, NP  brimonidine (ALPHAGAN) 0.2 % ophthalmic solution Place 1 drop into the left eye 3 (three) times daily.    [provider]  carvedilol (COREG) 25 MG tablet Take 25 mg by mouth 2 (two) times daily. 08/17/16   [provider]  dorzolamidel-timolol (COSOPT) 22.3-6.8 MG/ML SOLN ophthalmic solution Place 1 drop into both eyes 2 (two) times daily.    [provider]  furosemide (LASIX) 40 MG tablet Take 40 mg by mouth daily. 09/20/16   [provider]  gabapentin (NEURONTIN) 300 MG capsule Take 1 capsule (300 mg total) by mouth at bedtime. 03/21/20   Mcarthur Rossetti, MD   latanoprost (XALATAN) 0.005 % ophthalmic solution Place 1 drop into the left eye nightly. 05/04/19   [provider]  lisinopril (PRINIVIL,ZESTRIL) 30 MG tablet take 1 tablet by mouth once daily 09/07/13   Rowe Clack, MD  losartan (COZAAR) 100 MG tablet Take 100 mg by mouth daily. 08/02/20   [provider]  timolol (TIMOPTIC) 0.5 % ophthalmic solution Place 1 drop into both eyes 2 (two) times daily. 10/30/16   [provider]    Family History Family History  Problem Relation Age of Onset   Cancer Mother    Alcohol abuse Father    Emphysema Father    Stroke Brother    Melanoma Brother     Social History Social History   Tobacco Use   Smoking status: Former    Packs/day: 1.00    Years: 35.00    Total pack years: 35.00    Types: Cigarettes    Quit date: 05/27/1980    Years since quitting: 41.6   Smokeless tobacco: Never  Vaping Use   Vaping Use: Never used  Substance Use Topics   Alcohol use: No    Alcohol/week: 0.0 standard drinks of alcohol   Drug use: No     Allergies   Patient has no known allergies.   Review of Systems Review of Systems  Respiratory:  Negative for cough, chest tightness and shortness of breath.   Cardiovascular:  Negative for chest pain.  Skin:  Positive for rash.     Physical Exam Triage Vital Signs ED Triage Vitals  Enc Vitals Group     BP 12/24/21 1423 (!) 171/94     Pulse Rate 12/24/21 1423 75     Resp 12/24/21 1423 20     Temp 12/24/21 1423 (!) 97.4 F (36.3 C)     Temp Source 12/24/21 1423 Oral     SpO2 12/24/21 1423 97 %     Weight --      Height --      Head Circumference --      Peak Flow --      Pain Score 12/24/21 1421 0     Pain Loc --      Pain Edu? --      Excl. in [place]? --    No data found.  Updated Vital Signs BP (!) 171/94 (BP Location: Right Arm)   Pulse 75   Temp (!) 97.4 F (36.3 C) (Oral)   Resp 20   SpO2 97%       Physical Exam Constitutional:      Appearance:  Normal appearance.  Cardiovascular:     Rate and Rhythm: Normal rate.  Pulmonary:  Effort: Pulmonary effort is normal.  Skin:    General: Skin is warm and dry.     Findings: Rash present.     Comments: Dandruff noted of scalp  Neurological:     Mental Status: She is alert.      UC Treatments / Results   Medications Ordered in UC Medications - No data to display  Initial Impression / Assessment and Plan / UC Course  I have reviewed the triage vital signs and the nursing notes.  Pertinent labs & imaging results that were available during my care of the patient were reviewed by me and considered in my medical decision making (see chart for details).     86 year old female with complaint of itchy scalp x3 months.  DDx include seborrheic dermatitis, dandruff, psoriasis of the scalp.  Exam most consistent with dandruff/seborrheic dermatitis.  Rx for [employer] shampoo-use as directed.  Advised her to avoid Benadryl or other antihistamines OTC given her history of glaucoma.  She will follow-up with her PCP/dermatologist if symptoms persist or worsen.  Final Clinical Impressions(s) / UC Diagnoses   Final diagnoses:  Seborrheic dermatitis     Discharge Instructions      You were seen today for itchy scalp.  Your exam is most consistent with dandruff/extremely dry scalp.  I have prescribed Selsun Blue that I want you to use 2 times weekly.  Avoid the use of a hair dryer as this will cause drying out of your scalp and make your symptoms worse.  Please avoid antihistamines OTC as this will worsen your glaucoma.     ED Prescriptions     Medication Sig Dispense Auth. Provider   selenium sulfide (SELSUN) 2.5 % shampoo Apply 1 Application topically 2 (two) times a week. 118 mL Jearld Fenton, NP      PDMP not reviewed this encounter.   Jearld Fenton, NP 12/24/21 1454

## 2021-12-24 NOTE — Discharge Instructions (Signed)
You were seen today for itchy scalp.  Your exam is most consistent with dandruff/extremely dry scalp.  I have prescribed Selsun Blue that I want you to use 2 times weekly.  Avoid the use of a hair dryer as this will cause drying out of your scalp and make your symptoms worse.  Please avoid antihistamines OTC as this will worsen your glaucoma.

## 2021-12-24 NOTE — ED Triage Notes (Signed)
Pt c/o scalp itching for about 3 months. Reports that hair dresser didn't really see anything to cause problem but maybe a few bumps behind ear.

## 2022-01-16 DIAGNOSIS — M2042 Other hammer toe(s) (acquired), left foot: Secondary | ICD-10-CM | POA: Diagnosis not present

## 2022-01-16 DIAGNOSIS — M792 Neuralgia and neuritis, unspecified: Secondary | ICD-10-CM | POA: Diagnosis not present

## 2022-01-16 DIAGNOSIS — M2012 Hallux valgus (acquired), left foot: Secondary | ICD-10-CM | POA: Diagnosis not present

## 2022-01-16 DIAGNOSIS — L84 Corns and callosities: Secondary | ICD-10-CM | POA: Diagnosis not present

## 2022-01-16 DIAGNOSIS — M2011 Hallux valgus (acquired), right foot: Secondary | ICD-10-CM | POA: Diagnosis not present

## 2022-01-16 DIAGNOSIS — I739 Peripheral vascular disease, unspecified: Secondary | ICD-10-CM | POA: Diagnosis not present

## 2022-01-16 DIAGNOSIS — M2041 Other hammer toe(s) (acquired), right foot: Secondary | ICD-10-CM | POA: Diagnosis not present

## 2022-02-05 DIAGNOSIS — H401112 Primary open-angle glaucoma, right eye, moderate stage: Secondary | ICD-10-CM | POA: Diagnosis not present

## 2022-02-05 DIAGNOSIS — H401123 Primary open-angle glaucoma, left eye, severe stage: Secondary | ICD-10-CM | POA: Diagnosis not present

## 2022-02-14 ENCOUNTER — Other Ambulatory Visit: Payer: Self-pay | Admitting: Orthopaedic Surgery

## 2022-02-22 DIAGNOSIS — L218 Other seborrheic dermatitis: Secondary | ICD-10-CM | POA: Diagnosis not present

## 2022-03-12 DIAGNOSIS — H401112 Primary open-angle glaucoma, right eye, moderate stage: Secondary | ICD-10-CM | POA: Diagnosis not present

## 2022-03-12 DIAGNOSIS — H401123 Primary open-angle glaucoma, left eye, severe stage: Secondary | ICD-10-CM | POA: Diagnosis not present

## 2022-03-20 DIAGNOSIS — E785 Hyperlipidemia, unspecified: Secondary | ICD-10-CM | POA: Diagnosis not present

## 2022-03-20 DIAGNOSIS — J449 Chronic obstructive pulmonary disease, unspecified: Secondary | ICD-10-CM | POA: Diagnosis not present

## 2022-03-20 DIAGNOSIS — G4710 Hypersomnia, unspecified: Secondary | ICD-10-CM | POA: Diagnosis not present

## 2022-03-20 DIAGNOSIS — H409 Unspecified glaucoma: Secondary | ICD-10-CM | POA: Diagnosis not present

## 2022-03-20 DIAGNOSIS — Z1331 Encounter for screening for depression: Secondary | ICD-10-CM | POA: Diagnosis not present

## 2022-03-20 DIAGNOSIS — Z0000 Encounter for general adult medical examination without abnormal findings: Secondary | ICD-10-CM | POA: Diagnosis not present

## 2022-03-20 DIAGNOSIS — N1832 Chronic kidney disease, stage 3b: Secondary | ICD-10-CM | POA: Diagnosis not present

## 2022-03-20 DIAGNOSIS — M5136 Other intervertebral disc degeneration, lumbar region: Secondary | ICD-10-CM | POA: Diagnosis not present

## 2022-03-20 DIAGNOSIS — Z1339 Encounter for screening examination for other mental health and behavioral disorders: Secondary | ICD-10-CM | POA: Diagnosis not present

## 2022-03-20 DIAGNOSIS — I129 Hypertensive chronic kidney disease with stage 1 through stage 4 chronic kidney disease, or unspecified chronic kidney disease: Secondary | ICD-10-CM | POA: Diagnosis not present

## 2022-03-20 DIAGNOSIS — I700 Atherosclerosis of aorta: Secondary | ICD-10-CM | POA: Diagnosis not present

## 2022-03-27 DIAGNOSIS — G4710 Hypersomnia, unspecified: Secondary | ICD-10-CM | POA: Diagnosis not present

## 2022-04-02 ENCOUNTER — Telehealth: Payer: Self-pay

## 2022-04-02 NOTE — Telephone Encounter (Signed)
T/C from pt asking if any tests we have done in the past would help explain the increased edema in both of her legs.  She stated by the end of the day they are so swollen she can barely walk.    Pt advised our test were from last year and she would need to call her PCP or cardiologist for evaluation.  Pt agreed to call.

## 2022-04-09 ENCOUNTER — Other Ambulatory Visit (HOSPITAL_COMMUNITY): Payer: Self-pay | Admitting: Internal Medicine

## 2022-04-09 DIAGNOSIS — M7989 Other specified soft tissue disorders: Secondary | ICD-10-CM

## 2022-04-10 ENCOUNTER — Ambulatory Visit (HOSPITAL_COMMUNITY)
Admission: RE | Admit: 2022-04-10 | Discharge: 2022-04-10 | Disposition: A | Discharge status: Home / Self Care | Payer: Medicare Other | Source: Ambulatory Visit | Attending: Internal Medicine | Admitting: Internal Medicine

## 2022-04-10 DIAGNOSIS — M7989 Other specified soft tissue disorders: Secondary | ICD-10-CM | POA: Diagnosis not present

## 2022-04-10 DIAGNOSIS — I872 Venous insufficiency (chronic) (peripheral): Secondary | ICD-10-CM | POA: Insufficient documentation

## 2022-04-10 DIAGNOSIS — R600 Localized edema: Secondary | ICD-10-CM | POA: Diagnosis not present

## 2022-04-26 ENCOUNTER — Inpatient Hospital Stay: Payer: Medicare Other | Admitting: Hematology and Oncology

## 2022-04-26 ENCOUNTER — Inpatient Hospital Stay: Payer: Medicare Other | Attending: Internal Medicine

## 2022-04-26 NOTE — Progress Notes (Signed)
No show

## 2022-05-03 ENCOUNTER — Ambulatory Visit: Payer: Medicare Other | Admitting: Cardiology

## 2022-05-08 DIAGNOSIS — M2042 Other hammer toe(s) (acquired), left foot: Secondary | ICD-10-CM | POA: Diagnosis not present

## 2022-05-08 DIAGNOSIS — M79674 Pain in right toe(s): Secondary | ICD-10-CM | POA: Diagnosis not present

## 2022-05-08 DIAGNOSIS — M2011 Hallux valgus (acquired), right foot: Secondary | ICD-10-CM | POA: Diagnosis not present

## 2022-05-08 DIAGNOSIS — M2012 Hallux valgus (acquired), left foot: Secondary | ICD-10-CM | POA: Diagnosis not present

## 2022-05-08 DIAGNOSIS — M2041 Other hammer toe(s) (acquired), right foot: Secondary | ICD-10-CM | POA: Diagnosis not present

## 2022-05-08 DIAGNOSIS — I739 Peripheral vascular disease, unspecified: Secondary | ICD-10-CM | POA: Diagnosis not present

## 2022-05-08 DIAGNOSIS — L84 Corns and callosities: Secondary | ICD-10-CM | POA: Diagnosis not present

## 2022-05-08 DIAGNOSIS — L602 Onychogryphosis: Secondary | ICD-10-CM | POA: Diagnosis not present

## 2022-05-08 DIAGNOSIS — M79675 Pain in left toe(s): Secondary | ICD-10-CM | POA: Diagnosis not present

## 2022-05-08 DIAGNOSIS — M792 Neuralgia and neuritis, unspecified: Secondary | ICD-10-CM | POA: Diagnosis not present

## 2022-05-08 DIAGNOSIS — B351 Tinea unguium: Secondary | ICD-10-CM | POA: Diagnosis not present

## 2022-05-16 ENCOUNTER — Telehealth: Payer: Self-pay | Admitting: Physician Assistant

## 2022-05-16 NOTE — Telephone Encounter (Signed)
Patient called to reschedule her appointments. 

## 2022-05-22 ENCOUNTER — Telehealth: Payer: Self-pay | Admitting: Hematology and Oncology

## 2022-05-22 ENCOUNTER — Telehealth: Payer: Self-pay | Admitting: Physician Assistant

## 2022-05-22 NOTE — Telephone Encounter (Signed)
Patient called to reschedule her appointments. 

## 2022-05-25 ENCOUNTER — Telehealth: Payer: Self-pay | Admitting: Hematology and Oncology

## 2022-05-25 NOTE — Telephone Encounter (Signed)
Patient called to verify time and date of upcoming appointment.

## 2022-06-11 DIAGNOSIS — H401123 Primary open-angle glaucoma, left eye, severe stage: Secondary | ICD-10-CM | POA: Diagnosis not present

## 2022-06-11 DIAGNOSIS — H401112 Primary open-angle glaucoma, right eye, moderate stage: Secondary | ICD-10-CM | POA: Diagnosis not present

## 2022-06-12 ENCOUNTER — Other Ambulatory Visit: Payer: Self-pay | Admitting: Hematology and Oncology

## 2022-06-12 DIAGNOSIS — D472 Monoclonal gammopathy: Secondary | ICD-10-CM

## 2022-06-12 NOTE — Progress Notes (Unsigned)
[facility] Telephone:([phone]) [phone]   Fax:([phone]) [phone]  HEMATOLOGY AND ONCOLOGY PROGRESS NOTE  Patient Care Team: Garlan Fillers, MD as PCP - General (Internal Medicine) Carman Ching, MD (Inactive) (Gastroenterology) Kathryne Hitch, MD (Orthopedic Surgery) Maeola Harman, MD (Neurosurgery) Nelida Gores, MD (Ophthalmology) Petrinitz, Tinnie Gens, DPM (Inactive) (Podiatry) Patton Salles, MD (Obstetrics and Gynecology) Venancio Poisson, MD (Dermatology) Mckinley Jewel, MD (Ophthalmology)  Hematological/Oncological History 1) 06/07/2020: Labs from [facility] - SPEP revealed m-protein 0.2. 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 2) 06/22/2020: Establish care with Shannon Kaufmann PA-C  CHIEF COMPLAINTS/PURPOSE OF CONSULTATION:  "IgG Kappa MGUS"  HISTORY OF PRESENTING ILLNESS:  [name] 87 y.o. female returns for a routine follow up for MGUS. Patient is unaccompanied for this visit.   On exam today, [name] reports she has been stable overall interim since her last visit.  She reports has her chronic back pain from osteoarthritis which has not changed considerably in the interim since her last visit.  She reports that she has not recently seen her kidney doctor and would like to get reconnected with him.  She is on her chart that she had chronic kidney disease and was unaware of this diagnosis.  She has has had no changes in her urine with bubbling, foaming, or change in the color/odor.  She reports that she is not having any other changes in her health.  Patient denies fevers, chills, night sweats, shortness of breath, chest pain or cough.  She has no other complaints.She has no other complaints. Rest of the 10 point ROS is below.    MEDICAL HISTORY:  Past Medical History:  Diagnosis Date   Arthritis    Bilateral edema of lower extremity    CKD (chronic kidney disease)    Constipation    Degenerative  arthritis of hip    s/p THR 05/2011   Depression    Glaucoma    Headache(784.0)    HLD (hyperlipidemia)    Hypertension    OAB (overactive bladder)    Osteoporosis    Primary osteoarthritis of right knee    Slow transit constipation    Unsteady gait    Venous insufficiency     SURGICAL HISTORY: Past Surgical History:  Procedure Laterality Date    cataracts removed  2013 L   ABDOMINAL HYSTERECTOMY  2003   BREAST BIOPSY  1960   BREAST CYSTS     X2 BENIGN   BREAST EXCISIONAL BIOPSY Left    BREAST SURGERY     ENDOVENOUS ABLATION SAPHENOUS VEIN W/ LASER Left 06/04/2019   endovenous laser ablation left greater saphenous vein by Cari Caraway MD    GLAUCOMA VALVE INSERTION Bilateral 2013, 2014   bond ([facility])   REDUCTION MAMMAPLASTY Bilateral 2000   TOTAL HIP ARTHROPLASTY  06/01/2011   Procedure: TOTAL HIP ARTHROPLASTY ANTERIOR APPROACH;  Surgeon: Kathryne Hitch, MD;  Location: [facility];  Service: Orthopedics;  Laterality: Left;  Left Total Hip Replacement, Direct Anterior Approach   TOTAL KNEE ARTHROPLASTY Right 03/14/2015   Procedure: TOTAL KNEE ARTHROPLASTY;  Surgeon: Ollen Gross, MD;  Location: [facility];  Service: Orthopedics;  Laterality: Right;    SOCIAL HISTORY: Social History   Socioeconomic History   Marital status: Divorced    Spouse name: Not on file   Number of children: Not on file   Years of education: Not on file   Highest education level: Not on file  Occupational History  Not on file  Tobacco Use   Smoking status: Former    Packs/day: 1.00    Years: 35.00    Additional pack years: 0.00    Total pack years: 35.00    Types: Cigarettes    Quit date: 05/27/1980    Years since quitting: 42.0   Smokeless tobacco: Never  Vaping Use   Vaping Use: Never used  Substance and Sexual Activity   Alcohol use: No    Alcohol/week: 0.0 standard drinks of alcohol   Drug use: No   Sexual activity: Not on file  Other Topics Concern   Not on file  Social History  Narrative   [place]  One child, [name].  Education: college.   Social Determinants of Health   Financial Resource Strain: Not on file  Food Insecurity: Not on file  Transportation Needs: Not on file  Physical Activity: Not on file  Stress: Not on file  Social Connections: Not on file  Intimate Partner Violence: Not on file    FAMILY HISTORY: Family History  Problem Relation Age of Onset   Cancer Mother    Alcohol abuse Father    Emphysema Father    Stroke Brother    Melanoma Brother     ALLERGIES:  has No Known Allergies.  MEDICATIONS:  Current Outpatient Medications  Medication Sig Dispense Refill   brimonidine (ALPHAGAN) 0.2 % ophthalmic solution Place 1 drop into the left eye 3 (three) times daily.     carvedilol (COREG) 25 MG tablet Take 25 mg by mouth 2 (two) times daily.     dorzolamidel-timolol (COSOPT) 22.3-6.8 MG/ML SOLN ophthalmic solution Place 1 drop into both eyes 2 (two) times daily.     furosemide (LASIX) 40 MG tablet Take 40 mg by mouth daily.     gabapentin (NEURONTIN) 300 MG capsule TAKE ONE CAPSULE AT BEDTIME 30 capsule 1   latanoprost (XALATAN) 0.005 % ophthalmic solution Place 1 drop into the left eye nightly.     lisinopril (PRINIVIL,ZESTRIL) 30 MG tablet take 1 tablet by mouth once daily 90 tablet 1   losartan (COZAAR) 100 MG tablet Take 100 mg by mouth daily.     selenium sulfide (SELSUN) 2.5 % shampoo Apply 1 Application topically 2 (two) times a week. 118 mL 0   timolol (TIMOPTIC) 0.5 % ophthalmic solution Place 1 drop into both eyes 2 (two) times daily.     No current facility-administered medications for this visit.    REVIEW OF SYSTEMS:   Constitutional: ( - ) fevers, ( - )  chills , ( - ) night sweats Eyes: ( - ) blurriness of vision, ( - ) double vision, ( - ) watery eyes Ears, nose, mouth, throat, and face: ( - ) mucositis, ( - ) sore throat Respiratory: ( - ) cough, ( - ) dyspnea, ( - ) wheezes Cardiovascular: (  - ) palpitation, ( - ) chest discomfort, ( - ) lower extremity swelling Gastrointestinal:  ( - ) nausea, ( - ) heartburn, ( - ) change in bowel habits Skin: ( - ) abnormal skin rashes Lymphatics: ( - ) new lymphadenopathy, ( - ) easy bruising Neurological: ( + ) numbness, ( + ) tingling, ( - ) new weaknesses Behavioral/Psych: ( - ) mood change, ( - ) new changes  All other systems were reviewed with the patient and are negative.  PHYSICAL EXAMINATION: ECOG PERFORMANCE STATUS: 1 - Symptomatic but completely ambulatory  Vitals:   06/13/22  1433  BP: (!) 157/79  Pulse: (!) 51  Resp: 16  Temp: 97.8 F (36.6 C)  SpO2: 97%    Filed Weights   06/13/22 1433  Weight: 184 lb (83.5 kg)     GENERAL: well appearing female in NAD, obese  SKIN: skin color, texture, turgor are normal, no rashes or significant lesions. Hyperpigmentation of bilateral lower extremities.  EYES: conjunctiva are pink and non-injected, sclera clear LUNGS: clear to auscultation and percussion with normal breathing effort HEART: regular rate & rhythm and no murmurs. Bilateral lower extremity edema.  Musculoskeletal: no cyanosis of digits and no clubbing  PSYCH: alert & oriented x 3, fluent speech NEURO: no focal motor/sensory deficits  LABORATORY DATA:  I have reviewed the data as listed    Latest Ref Rng & Units 06/13/2022    2:02 PM 10/25/2021    2:39 PM 04/25/2021    1:34 PM  CBC  WBC 4.0 - 10.5 K/uL 4.9  5.6  4.6   Hemoglobin 12.0 - 15.0 g/dL 16.1  09.6  04.5   Hematocrit 36.0 - 46.0 % 35.6  39.0  37.5   Platelets 150 - 400 K/uL 201  214  196        Latest Ref Rng & Units 06/13/2022    2:02 PM 10/25/2021    2:39 PM 04/25/2021    1:34 PM  CMP  Glucose 70 - 99 mg/dL 79  86  94   BUN 8 - 23 mg/dL 19  15  26    Creatinine 0.44 - 1.00 mg/dL 4.09  8.11  9.14   Sodium 135 - 145 mmol/L 135  138  130   Potassium 3.5 - 5.1 mmol/L 4.0  4.0  4.0   Chloride 98 - 111 mmol/L 106  107  100   CO2 22 - 32 mmol/L 23   25  22    Calcium 8.9 - 10.3 mg/dL 9.2  9.9  9.7   Total Protein 6.5 - 8.1 g/dL 7.1  7.8  7.1   Total Bilirubin 0.3 - 1.2 mg/dL 0.7  0.9  0.7   Alkaline Phos 38 - 126 U/L 109  87  79   AST 15 - 41 U/L 20  21  20    ALT 0 - 44 U/L 15  16  15     ASSESSMENT & PLAN [name] is a 87 y.o. female presenting presenting to the clinic for MGUS.    #IgG Kappa MGUS: --Labs from 10/25/2021 showed SPEP with M protein of 0.8. IFE showed IgG monoclonal protein with kappa light chain specificity. UPEP was unremarkable. Kappa free light chain was elevated at 44 and kappa/lambda ration was elevated at 2.95 in the setting of chronic kidney disease.  --DG bone met survey from 11/01/2021 did not show lytic lesions. Next due around Oct 2024.  --Labs today show white blood cell 4.9, hemoglobin 12.7, MCV 94.7, and platelets of 201. MM panel and serum free light chains are pending.  --RTC in 6 months unless pending labs require further workup.   No orders of the defined types were placed in this encounter.  All questions were answered. The patient knows to call the clinic with any problems, questions or concerns.  I have spent a total of 25 minutes minutes of face-to-face and non-face-to-face time, preparing to see the patient, performing a medically appropriate examination, counseling and educating the patient, ordering tests, documenting clinical information in the electronic health record and care coordination.   Ulysees Barns,  MD Department of Hematology/Oncology [facility] Phone: [phone] Pager: [phone] Email: [email or web address]

## 2022-06-13 ENCOUNTER — Inpatient Hospital Stay (HOSPITAL_BASED_OUTPATIENT_CLINIC_OR_DEPARTMENT_OTHER): Payer: Medicare Other | Admitting: Hematology and Oncology

## 2022-06-13 ENCOUNTER — Other Ambulatory Visit: Payer: Self-pay

## 2022-06-13 ENCOUNTER — Inpatient Hospital Stay: Payer: Medicare Other | Attending: Internal Medicine

## 2022-06-13 VITALS — BP 157/79 | HR 51 | Temp 97.8°F | Resp 16 | Wt 184.0 lb

## 2022-06-13 DIAGNOSIS — D472 Monoclonal gammopathy: Secondary | ICD-10-CM

## 2022-06-13 DIAGNOSIS — M1990 Unspecified osteoarthritis, unspecified site: Secondary | ICD-10-CM | POA: Diagnosis not present

## 2022-06-13 DIAGNOSIS — Z87891 Personal history of nicotine dependence: Secondary | ICD-10-CM | POA: Diagnosis not present

## 2022-06-13 DIAGNOSIS — N189 Chronic kidney disease, unspecified: Secondary | ICD-10-CM | POA: Diagnosis not present

## 2022-06-13 DIAGNOSIS — I129 Hypertensive chronic kidney disease with stage 1 through stage 4 chronic kidney disease, or unspecified chronic kidney disease: Secondary | ICD-10-CM | POA: Diagnosis not present

## 2022-06-13 DIAGNOSIS — Z809 Family history of malignant neoplasm, unspecified: Secondary | ICD-10-CM | POA: Insufficient documentation

## 2022-06-13 DIAGNOSIS — Z808 Family history of malignant neoplasm of other organs or systems: Secondary | ICD-10-CM | POA: Insufficient documentation

## 2022-06-13 LAB — CMP (CANCER CENTER ONLY)
ALT: 15 U/L (ref 0–44)
AST: 20 U/L (ref 15–41)
Albumin: 4.1 g/dL (ref 3.5–5.0)
Alkaline Phosphatase: 109 U/L (ref 38–126)
Anion gap: 6 (ref 5–15)
BUN: 19 mg/dL (ref 8–23)
CO2: 23 mmol/L (ref 22–32)
Calcium: 9.2 mg/dL (ref 8.9–10.3)
Chloride: 106 mmol/L (ref 98–111)
Creatinine: 1.05 mg/dL — ABNORMAL HIGH (ref 0.44–1.00)
GFR, Estimated: 52 mL/min — ABNORMAL LOW (ref >60)
Glucose, Bld: 79 mg/dL (ref 70–99)
Potassium: 4 mmol/L (ref 3.5–5.1)
Sodium: 135 mmol/L (ref 135–145)
Total Bilirubin: 0.7 mg/dL (ref 0.3–1.2)
Total Protein: 7.1 g/dL (ref 6.5–8.1)

## 2022-06-13 LAB — CBC WITH DIFFERENTIAL (CANCER CENTER ONLY)
Abs Immature Granulocytes: 0.02 10*3/uL (ref 0.00–0.07)
Basophils Absolute: 0.1 10*3/uL (ref 0.0–0.1)
Basophils Relative: 1 %
Eosinophils Absolute: 0.6 10*3/uL — ABNORMAL HIGH (ref 0.0–0.5)
Eosinophils Relative: 12 %
HCT: 35.6 % — ABNORMAL LOW (ref 36.0–46.0)
Hemoglobin: 12.7 g/dL (ref 12.0–15.0)
Immature Granulocytes: 0 %
Lymphocytes Relative: 30 %
Lymphs Abs: 1.5 10*3/uL (ref 0.7–4.0)
MCH: 33.8 pg (ref 26.0–34.0)
MCHC: 35.7 g/dL (ref 30.0–36.0)
MCV: 94.7 fL (ref 80.0–100.0)
Monocytes Absolute: 0.4 10*3/uL (ref 0.1–1.0)
Monocytes Relative: 9 %
Neutro Abs: 2.3 10*3/uL (ref 1.7–7.7)
Neutrophils Relative %: 48 %
Platelet Count: 201 10*3/uL (ref 150–400)
RBC: 3.76 MIL/uL — ABNORMAL LOW (ref 3.87–5.11)
RDW: 12.3 % (ref 11.5–15.5)
WBC Count: 4.9 10*3/uL (ref 4.0–10.5)
nRBC: 0 % (ref 0.0–0.2)

## 2022-06-13 LAB — LACTATE DEHYDROGENASE: LDH: 176 U/L (ref 98–192)

## 2022-06-14 LAB — KAPPA/LAMBDA LIGHT CHAINS
Kappa free light chain: 43.7 mg/L — ABNORMAL HIGH (ref 3.3–19.4)
Kappa, lambda light chain ratio: 2.84 — ABNORMAL HIGH (ref 0.26–1.65)
Lambda free light chains: 15.4 mg/L (ref 5.7–26.3)

## 2022-06-21 LAB — MULTIPLE MYELOMA PANEL, SERUM
Albumin SerPl Elph-Mcnc: 3.7 g/dL (ref 2.9–4.4)
Albumin/Glob SerPl: 1.3 (ref 0.7–1.7)
Alpha 1: 0.2 g/dL (ref 0.0–0.4)
Alpha2 Glob SerPl Elph-Mcnc: 0.7 g/dL (ref 0.4–1.0)
B-Globulin SerPl Elph-Mcnc: 0.9 g/dL (ref 0.7–1.3)
Gamma Glob SerPl Elph-Mcnc: 1.1 g/dL (ref 0.4–1.8)
Globulin, Total: 3 g/dL (ref 2.2–3.9)
IgA: 87 mg/dL (ref 64–422)
IgG (Immunoglobin G), Serum: 1325 mg/dL (ref 586–1602)
IgM (Immunoglobulin M), Srm: 49 mg/dL (ref 26–217)
M Protein SerPl Elph-Mcnc: 0.8 g/dL — ABNORMAL HIGH
Total Protein ELP: 6.7 g/dL (ref 6.0–8.5)

## 2022-06-22 DIAGNOSIS — H43813 Vitreous degeneration, bilateral: Secondary | ICD-10-CM | POA: Diagnosis not present

## 2022-06-22 DIAGNOSIS — H5213 Myopia, bilateral: Secondary | ICD-10-CM | POA: Diagnosis not present

## 2022-06-22 DIAGNOSIS — H401132 Primary open-angle glaucoma, bilateral, moderate stage: Secondary | ICD-10-CM | POA: Diagnosis not present

## 2022-06-22 DIAGNOSIS — H04123 Dry eye syndrome of bilateral lacrimal glands: Secondary | ICD-10-CM | POA: Diagnosis not present

## 2022-06-22 DIAGNOSIS — H0100A Unspecified blepharitis right eye, upper and lower eyelids: Secondary | ICD-10-CM | POA: Diagnosis not present

## 2022-06-22 DIAGNOSIS — H524 Presbyopia: Secondary | ICD-10-CM | POA: Diagnosis not present

## 2022-06-22 DIAGNOSIS — Z961 Presence of intraocular lens: Secondary | ICD-10-CM | POA: Diagnosis not present

## 2022-07-03 ENCOUNTER — Ambulatory Visit: Payer: Medicare Other | Admitting: Physician Assistant

## 2022-07-03 ENCOUNTER — Other Ambulatory Visit: Payer: Medicare Other

## 2022-07-04 ENCOUNTER — Other Ambulatory Visit: Payer: Medicare Other

## 2022-07-04 ENCOUNTER — Ambulatory Visit: Payer: Medicare Other | Admitting: Physician Assistant

## 2022-07-18 DIAGNOSIS — L602 Onychogryphosis: Secondary | ICD-10-CM | POA: Diagnosis not present

## 2022-07-18 DIAGNOSIS — M79675 Pain in left toe(s): Secondary | ICD-10-CM | POA: Diagnosis not present

## 2022-07-18 DIAGNOSIS — L84 Corns and callosities: Secondary | ICD-10-CM | POA: Diagnosis not present

## 2022-07-18 DIAGNOSIS — I739 Peripheral vascular disease, unspecified: Secondary | ICD-10-CM | POA: Diagnosis not present

## 2022-07-18 DIAGNOSIS — M79674 Pain in right toe(s): Secondary | ICD-10-CM | POA: Diagnosis not present

## 2022-07-18 DIAGNOSIS — B351 Tinea unguium: Secondary | ICD-10-CM | POA: Diagnosis not present

## 2022-08-17 ENCOUNTER — Encounter: Payer: Self-pay | Admitting: Gastroenterology

## 2022-09-03 ENCOUNTER — Ambulatory Visit: Payer: Medicare Other | Admitting: Podiatry

## 2022-09-03 DIAGNOSIS — G629 Polyneuropathy, unspecified: Secondary | ICD-10-CM

## 2022-09-03 DIAGNOSIS — M2042 Other hammer toe(s) (acquired), left foot: Secondary | ICD-10-CM

## 2022-09-03 DIAGNOSIS — M21619 Bunion of unspecified foot: Secondary | ICD-10-CM

## 2022-09-03 DIAGNOSIS — M2041 Other hammer toe(s) (acquired), right foot: Secondary | ICD-10-CM | POA: Diagnosis not present

## 2022-09-03 DIAGNOSIS — R609 Edema, unspecified: Secondary | ICD-10-CM

## 2022-09-03 NOTE — Progress Notes (Signed)
Subjective:   Patient ID: [name], female   DOB: 87 y.o.   MRN: [id number]   HPI No chief complaint on file.    87 year old female presents the office today as well as possible neuropathy that she thinks that she has.  She has numbness and burning to her toes.  She also gets pain in her toes mostly with wearing shoes and other toes contracts.  She is not wondering about the bunions but she is asked about the contracture of the other toes.  Review of Systems  All other systems reviewed and are negative.  Past Medical History:  Diagnosis Date   Arthritis    Bilateral edema of lower extremity    CKD (chronic kidney disease)    Constipation    Degenerative arthritis of hip    s/p THR 05/2011   Depression    Glaucoma    Headache(784.0)    HLD (hyperlipidemia)    Hypertension    OAB (overactive bladder)    Osteoporosis    Primary osteoarthritis of right knee    Slow transit constipation    Unsteady gait    Venous insufficiency     Past Surgical History:  Procedure Laterality Date    cataracts removed  2013 L   ABDOMINAL HYSTERECTOMY  2003   BREAST BIOPSY  1960   BREAST CYSTS     X2 BENIGN   BREAST EXCISIONAL BIOPSY Left    BREAST SURGERY     ENDOVENOUS ABLATION SAPHENOUS VEIN W/ LASER Left 06/04/2019   endovenous laser ablation left greater saphenous vein by Cari Caraway MD    GLAUCOMA VALVE INSERTION Bilateral 2013, 2014   bond ([facility])   REDUCTION MAMMAPLASTY Bilateral 2000   TOTAL HIP ARTHROPLASTY  06/01/2011   Procedure: TOTAL HIP ARTHROPLASTY ANTERIOR APPROACH;  Surgeon: Kathryne Hitch, MD;  Location: [facility];  Service: Orthopedics;  Laterality: Left;  Left Total Hip Replacement, Direct Anterior Approach   TOTAL KNEE ARTHROPLASTY Right 03/14/2015   Procedure: TOTAL KNEE ARTHROPLASTY;  Surgeon: Ollen Gross, MD;  Location: [facility];  Service: Orthopedics;  Laterality: Right;     Current Outpatient Medications:    brimonidine (ALPHAGAN) 0.2 % ophthalmic  solution, Place 1 drop into the left eye 3 (three) times daily., Disp: , Rfl:    carvedilol (COREG) 25 MG tablet, Take 25 mg by mouth 2 (two) times daily., Disp: , Rfl:    dorzolamidel-timolol (COSOPT) 22.3-6.8 MG/ML SOLN ophthalmic solution, Place 1 drop into both eyes 2 (two) times daily., Disp: , Rfl:    furosemide (LASIX) 40 MG tablet, Take 40 mg by mouth daily., Disp: , Rfl:    gabapentin (NEURONTIN) 300 MG capsule, TAKE ONE CAPSULE AT BEDTIME, Disp: 30 capsule, Rfl: 1   latanoprost (XALATAN) 0.005 % ophthalmic solution, Place 1 drop into the left eye nightly., Disp: , Rfl:    lisinopril (PRINIVIL,ZESTRIL) 30 MG tablet, take 1 tablet by mouth once daily, Disp: 90 tablet, Rfl: 1   losartan (COZAAR) 100 MG tablet, Take 100 mg by mouth daily., Disp: , Rfl:    selenium sulfide (SELSUN) 2.5 % shampoo, Apply 1 Application topically 2 (two) times a week., Disp: 118 mL, Rfl: 0   timolol (TIMOPTIC) 0.5 % ophthalmic solution, Place 1 drop into both eyes 2 (two) times daily., Disp: , Rfl:   No Known Allergies         Objective:  Physical Exam  General: AAO x3, NAD  Dermatological: There is mild erythema on the dorsal  PIPJ's as well as the bunion from irritation but there is no skin breakdown or warmth.  No open lesions.  Vascular: Dorsalis Pedis artery and Posterior Tibial artery pedal pulses are, decreased bilateral There is no pain with calf compression, swelling, warmth, erythema.   Neruologic: Grossly intact via light touch bilateral.   Musculoskeletal: Moderate severe bunions are present with semirigid hammertoe contractures of the second digit and flexible contractures of the other digits.  There is tenderness palpation along the toes himself.  There is a symmetrical bilateral lower extremity edema present.  No pain with calf compression.    Assessment:   87 year old female with bunion, hammertoes, likely neuropathy     Plan:  -Treatment options discussed including all  alternatives, risks, and complications -Etiology of symptoms were discussed -Will check ABI to make sure adequate circulation -Discussed likely neuropathy although sensation intact.  We discussed B complex vitamin as well as alpha lipoic acid. -We discussed flexor tenotomy of the toes and she was to try this.  She does not want proceed with bunion.  Discussed the procedure this is not a guarantee but she like to try this.  Will do this pending vascular studies.  -Dispensed offloading pads for the bunion, hammertoes.  Vivi Barrack DPM

## 2022-09-10 ENCOUNTER — Ambulatory Visit (HOSPITAL_COMMUNITY)
Admission: RE | Admit: 2022-09-10 | Discharge: 2022-09-10 | Disposition: A | Discharge status: Home / Self Care | Payer: Medicare Other | Source: Ambulatory Visit | Attending: Podiatry | Admitting: Podiatry

## 2022-09-10 ENCOUNTER — Other Ambulatory Visit: Payer: Self-pay | Admitting: Podiatry

## 2022-09-10 ENCOUNTER — Telehealth: Payer: Self-pay

## 2022-09-10 DIAGNOSIS — M2041 Other hammer toe(s) (acquired), right foot: Secondary | ICD-10-CM

## 2022-09-10 DIAGNOSIS — R609 Edema, unspecified: Secondary | ICD-10-CM | POA: Diagnosis not present

## 2022-09-10 DIAGNOSIS — R0989 Other specified symptoms and signs involving the circulatory and respiratory systems: Secondary | ICD-10-CM | POA: Insufficient documentation

## 2022-09-10 DIAGNOSIS — M21619 Bunion of unspecified foot: Secondary | ICD-10-CM

## 2022-09-10 DIAGNOSIS — G629 Polyneuropathy, unspecified: Secondary | ICD-10-CM

## 2022-09-10 LAB — VAS US ABI WITH/WO TBI
Left ABI: 0.69
Right ABI: 0.51

## 2022-09-10 NOTE — Telephone Encounter (Signed)
Pt walked into clinic to purchase compression stockings.  Pt's measurements not in chart. Pt measured in clinic.  Left: Ankle - 28 cm Calf - 40 cm  Right: Ankle - 26 cm Calf - 37 cm  Pt purchased 1 pair of Large knee high 20-30 mmHg per her podiatrist. Instructed her on how to properly put them on and some tips for ease of wear. Confirmed understanding.

## 2022-09-12 ENCOUNTER — Other Ambulatory Visit: Payer: Self-pay | Admitting: Podiatry

## 2022-09-12 DIAGNOSIS — I739 Peripheral vascular disease, unspecified: Secondary | ICD-10-CM

## 2022-09-20 ENCOUNTER — Ambulatory Visit: Payer: Medicare Other | Admitting: Podiatry

## 2022-09-20 DIAGNOSIS — M21619 Bunion of unspecified foot: Secondary | ICD-10-CM

## 2022-09-20 DIAGNOSIS — M79675 Pain in left toe(s): Secondary | ICD-10-CM | POA: Diagnosis not present

## 2022-09-20 DIAGNOSIS — G629 Polyneuropathy, unspecified: Secondary | ICD-10-CM

## 2022-09-20 DIAGNOSIS — M79674 Pain in right toe(s): Secondary | ICD-10-CM | POA: Diagnosis not present

## 2022-09-20 DIAGNOSIS — M2041 Other hammer toe(s) (acquired), right foot: Secondary | ICD-10-CM | POA: Diagnosis not present

## 2022-09-20 DIAGNOSIS — B351 Tinea unguium: Secondary | ICD-10-CM | POA: Diagnosis not present

## 2022-09-20 DIAGNOSIS — I739 Peripheral vascular disease, unspecified: Secondary | ICD-10-CM | POA: Diagnosis not present

## 2022-09-20 DIAGNOSIS — R609 Edema, unspecified: Secondary | ICD-10-CM | POA: Diagnosis not present

## 2022-09-20 DIAGNOSIS — M2042 Other hammer toe(s) (acquired), left foot: Secondary | ICD-10-CM | POA: Diagnosis not present

## 2022-09-24 NOTE — Progress Notes (Signed)
Subjective:   Patient ID: [name], female   DOB: 87 y.o.   MRN: [id number]   HPI Chief Complaint  Patient presents with   Follow-up    Follow up to bilateral feet swelling and foot pain. Patient did the vascular test. Patient stated her pain is better than the last visit.       87 year old female presents the office today for follow-up evaluation of foot pain.  She is scheduled to see vascular surgery.  She feels that overall her pain is better to her feet.  She still has hammertoes that cause discomfort..  She discussed doing a procedure in the toes.  She gets swelling to her feet.  Denies any fevers or chills or any open lesions.        Objective:  Physical Exam  General: AAO x3, NAD  Dermatological: There is mild erythema on the dorsal PIPJ's as well as the bunion from irritation but there is no skin breakdown or warmth.  Nails are hypertrophic, dystrophic and elongated and there is mild incurvation of the nail borders.  She gets tenderness nails 1-5 bilaterally.  No open lesions.  No ulcerations or signs of infection.  Vascular: Dorsalis Pedis artery and Posterior Tibial artery pedal pulses are decreased bilateral There is no pain with calf compression, swelling, warmth, erythema.   Neruologic: Grossly intact via light touch bilateral.   Musculoskeletal: Moderate severe bunions are present with semirigid hammertoe contractures of the second digit and flexible contractures of the other digits.  There is tenderness palpation along the toes himself.  There is a symmetrical bilateral lower extremity edema present.  No pain with calf compression.    Assessment:   87 year old female with bunion, hammertoes, likely neuropathy     Plan:  -Treatment options discussed including all alternatives, risks, and complications -I think her symptoms are multifactorial.  I will have her see vascular surgery given abnormal ABIs I did a lot of her discomfort cells coming from the nails as  well as the hammertoes and bunion.  I sharply debrided the nails x 10 without any complications or bleeding.  Dispensed offloading pads.  Discussed shoe modifications avoid excess pressure.  We discussed a flexor tenotomy for the hammertoes however we held off on this today given her circulation.  Continue gabapentin.  Return in about 3 months (around 12/21/2022), or if symptoms worsen or fail to improve, for toe pain.  Vivi Barrack DPM

## 2022-10-16 ENCOUNTER — Encounter: Payer: Medicare Other | Admitting: Vascular Surgery

## 2022-10-28 NOTE — Progress Notes (Unsigned)
[facility] [place]  ASSESSMENT / PLAN: [name] is a 87 y.o. female with atherosclerosis of native arteries of bilateral lower extremities causing no symptoms. She has chronic venous insufficiency of bilateral lower extremities causing pigmentation (C4 disease).  Recommend:  Abstinence from all tobacco products. Blood glucose control with goal A1c < 7%. Blood pressure control with goal blood pressure < 140/90 mmHg. Lipid reduction therapy with goal LDL-C <100 mg/dL  Aspirin 81mg  PO QD.  Atorvastatin 40-80mg  PO QD (or other "high intensity" statin therapy).  No role for intervention without limb-threatening symptoms.  Would not recommend elective surgery on the feet given her poor toe pressures and toe brachial indices bilaterally.  Recommend compression garments for lower extremity swelling.  Follow-up with me on an as-needed basis.  CHIEF COMPLAINT: Swelling, occasionally painful toes  HISTORY OF PRESENT ILLNESS: [name] is a 87 y.o. female referred to clinic for evaluation of peripheral arterial disease.  The patient has hammertoes which she would like corrected.  She saw Dr. Loreta Ave, who recommended vascular testing before proceeding.  I reviewed these results in detail with her today.  The patient does not walk fast or far enough to claudicate.  He does not have rest pain in her feet.  She does have sensation intact in her feet.  She has no ulcers about her feet.  She does have bunion and hammertoe deformities to the feet.   Past Medical History:  Diagnosis Date   Arthritis    Bilateral edema of lower extremity    CKD (chronic kidney disease)    Constipation    Degenerative arthritis of hip    s/p THR 05/2011   Depression    Glaucoma    Headache(784.0)    HLD (hyperlipidemia)    Hypertension    OAB (overactive bladder)    Osteoporosis    Primary osteoarthritis of right knee    Slow transit constipation    Unsteady gait    Venous  insufficiency     Past Surgical History:  Procedure Laterality Date    cataracts removed  2013 L   ABDOMINAL HYSTERECTOMY  2003   BREAST BIOPSY  1960   BREAST CYSTS     X2 BENIGN   BREAST EXCISIONAL BIOPSY Left    BREAST SURGERY     ENDOVENOUS ABLATION SAPHENOUS VEIN W/ LASER Left 06/04/2019   endovenous laser ablation left greater saphenous vein by Cari Caraway MD    GLAUCOMA VALVE INSERTION Bilateral 2013, 2014   [facility] ([facility])   REDUCTION MAMMAPLASTY Bilateral 2000   TOTAL HIP ARTHROPLASTY  06/01/2011   Procedure: TOTAL HIP ARTHROPLASTY ANTERIOR APPROACH;  Surgeon: Kathryne Hitch, MD;  Location: [facility];  Service: Orthopedics;  Laterality: Left;  Left Total Hip Replacement, Direct Anterior Approach   TOTAL KNEE ARTHROPLASTY Right 03/14/2015   Procedure: TOTAL KNEE ARTHROPLASTY;  Surgeon: Ollen Gross, MD;  Location: [facility];  Service: Orthopedics;  Laterality: Right;    Family History  Problem Relation Age of Onset   Cancer Mother    Alcohol abuse Father    Emphysema Father    Stroke Brother    Melanoma Brother     Social History   Socioeconomic History   Marital status: Divorced    Spouse name: Not on file   Number of children: Not on file   Years of education: Not on file   Highest education level: Not on file  Occupational History   Not on file  Tobacco  Use   Smoking status: Former    Current packs/day: 0.00    Average packs/day: 1 pack/day for 35.0 years (35.0 ttl pk-yrs)    Types: Cigarettes    Start date: 05/27/1945    Quit date: 05/27/1980    Years since quitting: 42.4   Smokeless tobacco: Never  Vaping Use   Vaping status: Never Used  Substance and Sexual Activity   Alcohol use: No    Alcohol/week: 0.0 standard drinks of alcohol   Drug use: No   Sexual activity: Not on file  Other Topics Concern   Not on file  Social History Narrative   [place]  One child, Greggory Stallion.  Education: college.   Social Determinants of Health    Financial Resource Strain: Not on file  Food Insecurity: Not on file  Transportation Needs: Not on file  Physical Activity: Not on file  Stress: Not on file  Social Connections: Not on file  Intimate Partner Violence: Not on file    Allergies  Allergen Reactions   Silicone Rash    Current Outpatient Medications  Medication Sig Dispense Refill   brimonidine (ALPHAGAN) 0.2 % ophthalmic solution Place 1 drop into the left eye 3 (three) times daily.     carvedilol (COREG) 25 MG tablet Take 25 mg by mouth 2 (two) times daily.     dorzolamidel-timolol (COSOPT) 22.3-6.8 MG/ML SOLN ophthalmic solution Place 1 drop into both eyes 2 (two) times daily.     furosemide (LASIX) 40 MG tablet Take 40 mg by mouth daily.     gabapentin (NEURONTIN) 300 MG capsule TAKE ONE CAPSULE AT BEDTIME 30 capsule 1   latanoprost (XALATAN) 0.005 % ophthalmic solution Place 1 drop into the left eye nightly.     lisinopril (PRINIVIL,ZESTRIL) 30 MG tablet take 1 tablet by mouth once daily 90 tablet 1   losartan (COZAAR) 100 MG tablet Take 100 mg by mouth daily.     selenium sulfide (SELSUN) 2.5 % shampoo Apply 1 Application topically 2 (two) times a week. 118 mL 0   timolol (TIMOPTIC) 0.5 % ophthalmic solution Place 1 drop into both eyes 2 (two) times daily.     No current facility-administered medications for this visit.    PHYSICAL EXAM Vitals:   10/29/22 1331  BP: (!) 168/78  Pulse: (!) 58  Temp: 97.6 F (36.4 C)  TempSrc: Temporal  SpO2: 95%  Weight: 188 lb 6.4 oz (85.5 kg)  Height: 5\' 1"  (1.549 m)    Elderly woman in no acute distress Regular rate and rhythm Unlabored breathing No palpable pedal pulses in the feet 2+ edema in the feet and ankles with hemosiderin deposition  PERTINENT LABORATORY AND RADIOLOGIC DATA  Most recent CBC    Latest Ref Rng & Units 06/13/2022    2:02 PM 10/25/2021    2:39 PM 04/25/2021    1:34 PM  CBC  WBC 4.0 - 10.5 K/uL 4.9  5.6  4.6   Hemoglobin 12.0 - 15.0  g/dL 11.9  14.7  82.9   Hematocrit 36.0 - 46.0 % 35.6  39.0  37.5   Platelets 150 - 400 K/uL 201  214  196      Most recent CMP    Latest Ref Rng & Units 06/13/2022    2:02 PM 10/25/2021    2:39 PM 04/25/2021    1:34 PM  CMP  Glucose 70 - 99 mg/dL 79  86  94   BUN 8 - 23 mg/dL  19  15  26    Creatinine 0.44 - 1.00 mg/dL 4.54  0.98  1.19   Sodium 135 - 145 mmol/L 135  138  130   Potassium 3.5 - 5.1 mmol/L 4.0  4.0  4.0   Chloride 98 - 111 mmol/L 106  107  100   CO2 22 - 32 mmol/L 23  25  22    Calcium 8.9 - 10.3 mg/dL 9.2  9.9  9.7   Total Protein 6.5 - 8.1 g/dL 7.1  7.8  7.1   Total Bilirubin 0.3 - 1.2 mg/dL 0.7  0.9  0.7   Alkaline Phos 38 - 126 U/L 109  87  79   AST 15 - 41 U/L 20  21  20    ALT 0 - 44 U/L 15  16  15      Renal function CrCl cannot be calculated (Patient's most recent lab result is older than the maximum 21 days allowed.).  No results found for: "HGBA1C"  LDL Cholesterol  Date Value Ref Range Status  07/10/2013 89 0 - 99 mg/dL Final    +-------+-----------+-----------+------------+------------+  ABI/TBIToday's ABIToday's TBIPrevious ABIPrevious TBI  +-------+-----------+-----------+------------+------------+  Right 0.51       0.16                                 +-------+-----------+-----------+------------+------------+  Left  0.69       0.26                                 +-------+-----------+-----------+------------+------------+    Rande Brunt. Lenell Antu, MD FACS [facility] Phone Number: [phone] 10/29/2022 4:26 PM   Total time spent on preparing this encounter including chart review, data review, collecting history, examining the patient, coordinating care for this new patient, 60 minutes.  Portions of this report may have been transcribed using voice recognition software.  Every effort has been made to ensure accuracy; however, inadvertent computerized transcription errors may still be  present.

## 2022-10-29 ENCOUNTER — Ambulatory Visit (INDEPENDENT_AMBULATORY_CARE_PROVIDER_SITE_OTHER): Payer: Medicare Other | Admitting: Vascular Surgery

## 2022-10-29 VITALS — BP 168/78 | HR 58 | Temp 97.6°F | Ht 61.0 in | Wt 188.4 lb

## 2022-10-29 DIAGNOSIS — I739 Peripheral vascular disease, unspecified: Secondary | ICD-10-CM | POA: Diagnosis not present

## 2022-10-29 DIAGNOSIS — I872 Venous insufficiency (chronic) (peripheral): Secondary | ICD-10-CM | POA: Diagnosis not present

## 2022-11-01 ENCOUNTER — Other Ambulatory Visit: Payer: Medicare Other

## 2022-11-01 ENCOUNTER — Ambulatory Visit: Payer: Medicare Other | Admitting: Gastroenterology

## 2022-11-01 ENCOUNTER — Encounter: Payer: Self-pay | Admitting: Gastroenterology

## 2022-11-01 VITALS — BP 122/70 | HR 75 | Ht 61.0 in | Wt 186.0 lb

## 2022-11-01 DIAGNOSIS — D472 Monoclonal gammopathy: Secondary | ICD-10-CM

## 2022-11-01 DIAGNOSIS — K5900 Constipation, unspecified: Secondary | ICD-10-CM

## 2022-11-01 DIAGNOSIS — R5383 Other fatigue: Secondary | ICD-10-CM

## 2022-11-01 LAB — CBC WITH DIFFERENTIAL/PLATELET
Basophils Absolute: 0.1 10*3/uL (ref 0.0–0.1)
Basophils Relative: 1.3 % (ref 0.0–3.0)
Eosinophils Absolute: 0.3 10*3/uL (ref 0.0–0.7)
Eosinophils Relative: 6.9 % — ABNORMAL HIGH (ref 0.0–5.0)
HCT: 38 % (ref 36.0–46.0)
Hemoglobin: 13.1 g/dL (ref 12.0–15.0)
Lymphocytes Relative: 27 % (ref 12.0–46.0)
Lymphs Abs: 1.3 10*3/uL (ref 0.7–4.0)
MCHC: 34.5 g/dL (ref 30.0–36.0)
MCV: 97 fL (ref 78.0–100.0)
Monocytes Absolute: 0.4 10*3/uL (ref 0.1–1.0)
Monocytes Relative: 8.3 % (ref 3.0–12.0)
Neutro Abs: 2.6 10*3/uL (ref 1.4–7.7)
Neutrophils Relative %: 56.5 % (ref 43.0–77.0)
Platelets: 213 10*3/uL (ref 150.0–400.0)
RBC: 3.91 Mil/uL (ref 3.87–5.11)
RDW: 13 % (ref 11.5–15.5)
WBC: 4.7 10*3/uL (ref 4.0–10.5)

## 2022-11-01 LAB — BASIC METABOLIC PANEL
BUN: 16 mg/dL (ref 6–23)
CO2: 26 meq/L (ref 19–32)
Calcium: 9.7 mg/dL (ref 8.4–10.5)
Chloride: 104 meq/L (ref 96–112)
Creatinine, Ser: 0.97 mg/dL (ref 0.40–1.20)
GFR: 53.27 mL/min — ABNORMAL LOW (ref >60.00)
Glucose, Bld: 94 mg/dL (ref 70–99)
Potassium: 4 meq/L (ref 3.5–5.1)
Sodium: 136 meq/L (ref 135–145)

## 2022-11-01 LAB — VITAMIN D 25 HYDROXY (VIT D DEFICIENCY, FRACTURES): VITD: 41.24 ng/mL (ref 30.00–100.00)

## 2022-11-01 LAB — B12 AND FOLATE PANEL
Folate: 23.5 ng/mL (ref >5.9)
Vitamin B-12: 619 pg/mL (ref 211–911)

## 2022-11-01 LAB — IBC + FERRITIN
Ferritin: 53 ng/mL (ref 10.0–291.0)
Iron: 102 ug/dL (ref 42–145)
Saturation Ratios: 27.2 % (ref 20.0–50.0)
TIBC: 375.2 ug/dL (ref 250.0–450.0)
Transferrin: 268 mg/dL (ref 212.0–360.0)

## 2022-11-01 NOTE — Progress Notes (Signed)
11/01/2022 [name] [id number] 12/22/1935   HISTORY OF PRESENT ILLNESS: This is an 87 year old female who is new to our office.  She is a self-referral.  She made this appointment for evaluation of black stools.  States that she was having black stools every day for about 6 months.  No longer having black stools now though for quite some time.  She does admit that she was taking some Pepto-Bismol each evening to help with some indigestion related to taking her pills.  She says she was taking her potassium pill and 2 or 3 other pills all at the same time and did feel like maybe they got hung up and caused some indigestion symptoms.  She is now spacing out taking her pills and is not having any further issues so stopped the Pepto-Bismol.  She is also having constipation.  Is not wanting to take any significant laxative.  She also complains of being fatigued and jokingly says "and do not tell me that it is due to my age".  Hemoglobin is normal and has been normal.  She follows with hematology/oncology for MGUS.  She says that overall she really feels well for her age.  Last colonoscopy was in 2015 by Dr. Randa Evens with only internal hemorrhoids seen at that time.  No further colonoscopy recommended.   Past Medical History:  Diagnosis Date   Arthritis    Bilateral edema of lower extremity    CKD (chronic kidney disease)    Constipation    Degenerative arthritis of hip    s/p THR 05/2011   Depression    Glaucoma    Headache(784.0)    HLD (hyperlipidemia)    Hypertension    OAB (overactive bladder)    Osteoporosis    Primary osteoarthritis of right knee    Slow transit constipation    Unsteady gait    Venous insufficiency    Past Surgical History:  Procedure Laterality Date    cataracts removed  2013 L   ABDOMINAL HYSTERECTOMY  2003   BREAST BIOPSY  1960   BREAST CYSTS     X2 BENIGN   BREAST EXCISIONAL BIOPSY Left    BREAST SURGERY     ENDOVENOUS ABLATION SAPHENOUS VEIN W/  LASER Left 06/04/2019   endovenous laser ablation left greater saphenous vein by Cari Caraway MD    GLAUCOMA VALVE INSERTION Bilateral 2013, 2014   [facility] ([facility])   REDUCTION MAMMAPLASTY Bilateral 2000   TOTAL HIP ARTHROPLASTY  06/01/2011   Procedure: TOTAL HIP ARTHROPLASTY ANTERIOR APPROACH;  Surgeon: Kathryne Hitch, MD;  Location: [facility];  Service: Orthopedics;  Laterality: Left;  Left Total Hip Replacement, Direct Anterior Approach   TOTAL KNEE ARTHROPLASTY Right 03/14/2015   Procedure: TOTAL KNEE ARTHROPLASTY;  Surgeon: Ollen Gross, MD;  Location: [facility];  Service: Orthopedics;  Laterality: Right;    reports that she quit smoking about 42 years ago. Her smoking use included cigarettes. She started smoking about 77 years ago. She has a 35 pack-year smoking history. She has never used smokeless tobacco. She reports that she does not drink alcohol and does not use drugs. family history includes Alcohol abuse in her father; Cancer in her mother; Emphysema in her father; Melanoma in her brother; Stroke in her brother. Allergies  Allergen Reactions   Silicone Rash      Outpatient Encounter Medications as of 11/01/2022  Medication Sig   brimonidine (ALPHAGAN) 0.2 % ophthalmic solution Place 1 drop into the left eye  3 (three) times daily.   carvedilol (COREG) 25 MG tablet Take 25 mg by mouth 2 (two) times daily.   dorzolamidel-timolol (COSOPT) 22.3-6.8 MG/ML SOLN ophthalmic solution Place 1 drop into both eyes 2 (two) times daily.   latanoprost (XALATAN) 0.005 % ophthalmic solution Place 1 drop into the left eye nightly.   losartan (COZAAR) 100 MG tablet Take 100 mg by mouth daily.   furosemide (LASIX) 40 MG tablet Take 40 mg by mouth daily. (Patient not taking: Reported on 11/01/2022)   gabapentin (NEURONTIN) 300 MG capsule TAKE ONE CAPSULE AT BEDTIME (Patient not taking: Reported on 11/01/2022)   lisinopril (PRINIVIL,ZESTRIL) 30 MG tablet take 1 tablet by mouth once daily (Patient not  taking: Reported on 11/01/2022)   selenium sulfide (SELSUN) 2.5 % shampoo Apply 1 Application topically 2 (two) times a week. (Patient not taking: Reported on 11/01/2022)   timolol (TIMOPTIC) 0.5 % ophthalmic solution Place 1 drop into both eyes 2 (two) times daily. (Patient not taking: Reported on 11/01/2022)   No facility-administered encounter medications on file as of 11/01/2022.    REVIEW OF SYSTEMS  : All other systems reviewed and negative except where noted in the History of Present Illness.   PHYSICAL EXAM: BP 122/70   Pulse 75   Ht 5\' 1"  (1.549 m)   Wt 186 lb (84.4 kg)   BMI 35.14 kg/m  General: Well developed white female in no acute distress Head: Normocephalic and atraumatic Eyes:  Sclerae anicteric, conjunctiva pink. Ears: Normal auditory acuity Lungs: Clear throughout to auscultation; no W/R/R. Heart: Regular rate and rhythm; no M/R/G. Musculoskeletal: Symmetrical with no gross deformities  Skin: No lesions on visible extremities Extremities: No edema  Neurological: Alert oriented x 4, grossly non-focal Psychological:  Alert and cooperative. Normal mood and affect  ASSESSMENT AND PLAN: *Constipation: Will start taking stool softeners in the form of docusate sodium beginning with 2 capsules daily and titrating up if needed.  Can also consider trying MiraLAX as well. *Black stools: Now resolved, she was using a dose of Pepto-Bismol each night for indigestion related to taking her pills so this is likely caused black stools.  Hemoglobin is normal.  No longer having black stools at this time. *Fatigue: Once again, hemoglobin normal.  She jokingly says "And do not tell me its due to my age".  Will check thyroid studies as she tells me she had a history of thyroid issues in the remote past after having her child.  Will also check iron studies, B12 and folate, and vitamin D levels.   CC:  Shannon Fillers, MD

## 2022-11-01 NOTE — Patient Instructions (Addendum)
Your provider has requested that you go to the basement level for lab work before leaving today. Press "B" on the elevator. The lab is located at the first door on the left as you exit the elevator.  Start Colace/Ducusate sodium - 2 tablet each evening, may increase if needed.  May start Miralax 1 capful daily in 8 ounces of liquid as needed if stool softeners not working.  _______________________________________________________  If your blood pressure at your visit was 140/90 or greater, please contact your primary care physician to follow up on this.  _______________________________________________________  If you are age 85 or older, your body mass index should be between 23-30. Your Body mass index is 35.14 kg/m. If this is out of the aforementioned range listed, please consider follow up with your Primary Care Provider.  If you are age 13 or younger, your body mass index should be between 19-25. Your Body mass index is 35.14 kg/m. If this is out of the aformentioned range listed, please consider follow up with your Primary Care Provider.   ________________________________________________________  The [facility] providers would like to encourage you to use [facility] to communicate with providers for non-urgent requests or questions.  Due to long hold times on the telephone, sending your provider a message by [facility] may be a faster and more efficient way to get a response.  Please allow 48 business hours for a response.  Please remember that this is for non-urgent requests.  _______________________________________________________

## 2022-11-02 LAB — TSH+T4F+T3FREE
Free T4: 1.21 ng/dL (ref 0.82–1.77)
T3, Free: 2.4 pg/mL (ref 2.0–4.4)
TSH: 1.31 u[IU]/mL (ref 0.450–4.500)

## 2022-11-05 NOTE — Progress Notes (Signed)
Agree with the assessment and plan as outlined by Jessica Zehr, PA-C.  Saulo Anthis E. Darcel Frane, MD  [facility]  

## 2022-11-15 DIAGNOSIS — M9903 Segmental and somatic dysfunction of lumbar region: Secondary | ICD-10-CM | POA: Diagnosis not present

## 2022-11-15 DIAGNOSIS — M62830 Muscle spasm of back: Secondary | ICD-10-CM | POA: Diagnosis not present

## 2022-11-15 DIAGNOSIS — M9902 Segmental and somatic dysfunction of thoracic region: Secondary | ICD-10-CM | POA: Diagnosis not present

## 2022-11-15 DIAGNOSIS — M47816 Spondylosis without myelopathy or radiculopathy, lumbar region: Secondary | ICD-10-CM | POA: Diagnosis not present

## 2022-11-21 DIAGNOSIS — M47816 Spondylosis without myelopathy or radiculopathy, lumbar region: Secondary | ICD-10-CM | POA: Diagnosis not present

## 2022-11-21 DIAGNOSIS — M9902 Segmental and somatic dysfunction of thoracic region: Secondary | ICD-10-CM | POA: Diagnosis not present

## 2022-11-21 DIAGNOSIS — M62830 Muscle spasm of back: Secondary | ICD-10-CM | POA: Diagnosis not present

## 2022-11-21 DIAGNOSIS — M9903 Segmental and somatic dysfunction of lumbar region: Secondary | ICD-10-CM | POA: Diagnosis not present

## 2022-11-27 ENCOUNTER — Telehealth: Payer: Self-pay

## 2022-11-27 ENCOUNTER — Telehealth: Payer: Self-pay | Admitting: Hematology and Oncology

## 2022-11-27 NOTE — Telephone Encounter (Signed)
Pt LVM stating that she would like to cancel her appt for 11/13 due to being out of town. Message sent to scheduling to r/s lab and MD appt.

## 2022-11-28 DIAGNOSIS — M47816 Spondylosis without myelopathy or radiculopathy, lumbar region: Secondary | ICD-10-CM | POA: Diagnosis not present

## 2022-11-28 DIAGNOSIS — M62830 Muscle spasm of back: Secondary | ICD-10-CM | POA: Diagnosis not present

## 2022-11-28 DIAGNOSIS — M9902 Segmental and somatic dysfunction of thoracic region: Secondary | ICD-10-CM | POA: Diagnosis not present

## 2022-11-28 DIAGNOSIS — M9903 Segmental and somatic dysfunction of lumbar region: Secondary | ICD-10-CM | POA: Diagnosis not present

## 2022-12-10 ENCOUNTER — Telehealth: Payer: Self-pay | Admitting: Interventional Cardiology

## 2022-12-10 NOTE — Telephone Encounter (Signed)
Pt c/o swelling/edema: STAT if pt has developed SOB within 24 hours  If swelling, where is the swelling located? Feet, legs and ankles  How much weight have you gained and in what time span?   Have you gained 2 pounds in a day or 5 pounds in a week?   Do you have a log of your daily weights (if so, list)?   Are you currently taking a fluid pill? Not at this time  Are you currently SOB? Yes, but not at this time  Have you traveled recently in a car or plane for an extended period of time? No- patient wanted to be seen- I made her an appointment on Thursday(12-13-22) with [facility]- please call to evaluate

## 2022-12-10 NOTE — Telephone Encounter (Signed)
Tried to reach patient. Phone just ring

## 2022-12-10 NOTE — Telephone Encounter (Signed)
Follow Up:      Patient's appointment is on 12-17-22, not 12-13-22.

## 2022-12-12 ENCOUNTER — Other Ambulatory Visit: Payer: Medicare Other

## 2022-12-12 ENCOUNTER — Ambulatory Visit: Payer: Medicare Other | Admitting: Hematology and Oncology

## 2022-12-16 NOTE — Progress Notes (Unsigned)
Cardiology Office Note:    Date:  12/17/2022   ID:  [name], DOB April 28, 1935, MRN [id number]  PCP:  Garlan Fillers, MD  Cardiologist:  None { Click to update primary MD,subspecialty MD or APP then REFRESH:1}    Referring MD: Garlan Fillers, MD   Chief Complaint: lower extremity edema  History of Present Illness:    [name] is a 87 y.o. female with a history of LBBB, chronic lower extremity edema secondary to venous insufficiency, PAD with moderately reduced ABIs bilaterally in 08/2022, hypertension, hyperlipidemia, and CKD stage III who presents today for further evaluation of lower extremity edema.   Patient was previously followed by Dr. Patty Sermons and Dr. Katrinka Blazing. She has a known LBBB. Myoview in 07/2013 was negative for ischemia. She also has a long history of chronic lower extremity edema felt to be secondary to venous varicosity and venous insufficiency (left leg chronically larger than the right). She was last seen by Dr. Katrinka Blazing in 03/2019 at which time she continued to have lower extremity edema.  She had previously been seen by Vascular Surgery for this and an ablation was recommended but never happened. She was not felt to have any active cardiac problems at that tim and was advised to follow back up with Vascular Surgery. She ultimately underwent a endovenous ablation of left greater saphenous vein in 05/2019. Last venous lower extremity ultrasound in 03/2022 showed no evidence of DVT but subcutaneous edema in calf and ankle bilaterally (left > right). ABIs in 08/2022 were indicative of moderate lower extremity arterial disease bilaterally (0.51 on the right and 0.69 on the left). He was seen by Vascular Surgery following this who recommended conservative/ medical therapy for treatment of PAD.  Patient presents today for further evaluation of lower extremity edema. ***   EKGs/Labs/Other Studies Reviewed:    The following studies were reviewed  Myoview  08/28/2013: Normal. No evidence of ischemia. EF 60%.  _______________  Lower Extremity Venous Dopplers 04/10/2022: Summary:  BILATERAL:  - No evidence of deep vein thrombosis seen in the lower extremities,  bilaterally.  -No evidence of popliteal cyst, bilaterally.  -Subcutaneous edema seen in calf and ankle, bilaterally. L > R  _______________  ABIs/ TBIs 09/10/2022: Summary:  Right: Resting right ankle-brachial index indicates moderate right lower  extremity arterial disease. The right toe-brachial index is abnormal. PPG  tracings appear dampened.   Left: Resting left ankle-brachial index indicates moderate left lower  extremity arterial disease. The left toe-brachial index is abnormal.    EKG:  EKG ordered today.   EKG Interpretation Date/Time:  Monday December 17 2022 10:10:09 EST Ventricular Rate:  61 PR Interval:  204 QRS Duration:  158 QT Interval:  538 QTC Calculation: 541 R Axis:   -20  Text Interpretation: Normal sinus rhythm with sinus arrhythmia Left bundle branch block When compared with ECG of 05-Apr-2020 14:36, No significant change was found Confirmed by Marjie Skiff [phone]) on 12/17/2022 10:15:46 AM    Recent Labs: 06/13/2022: ALT 15 11/01/2022: BUN 16; Creatinine, Ser 0.97; Hemoglobin 13.1; Platelets 213.0; Potassium 4.0; Sodium 136; TSH 1.310  Recent Lipid Panel    Component Value Date/Time   CHOL 163 07/10/2013 1229   TRIG 87.0 07/10/2013 1229   HDL 56.70 07/10/2013 1229   CHOLHDL 3 07/10/2013 1229   VLDL 17.4 07/10/2013 1229   LDLCALC 89 07/10/2013 1229    Physical Exam:    Vital Signs: BP 118/88 (BP Location: Left Arm, Patient Position: Sitting,  Cuff Size: Normal)   Pulse 61   Ht 5\' 1"  (1.549 m)   Wt 191 lb (86.6 kg)   SpO2 95%   BMI 36.09 kg/m     Wt Readings from Last 3 Encounters:  12/17/22 191 lb (86.6 kg)  11/01/22 186 lb (84.4 kg)  10/29/22 188 lb 6.4 oz (85.5 kg)     General: 87 y.o. Caucasian female in no acute  distress. HEENT: Normocephalic and atraumatic. Sclera clear.  Neck: Supple. No carotid bruits. No JVD appreciated with patient sitting completely upright. Heart: RRR. Distinct S1 and S2. No murmurs, gallops, or rubs.  Lungs: No increased work of breathing. Clear to ausculation bilaterally. No wheezes, rhonchi, or rales.  Abdomen: Soft, distended/ obese, and non-tender to palpitation.  Extremities: 3+ pitting edema of bilateral lower extremities (left > right).  Skin: Warm and dry. Hyperpigmentation and skin changes of left lower consistent with chronic venous insufficiency.  Neuro: No focal deficits. Psych: Normal affect. Responds appropriately.  Assessment:    1. Bilateral lower extremity edema   2. Chronic venous insufficiency   3. PAD (peripheral artery disease) (HCC)   4. Primary hypertension   5. Hyperlipidemia, unspecified hyperlipidemia type   6. Stage 3 chronic kidney disease, unspecified whether stage 3a or 3b CKD (HCC)     Plan:    Chronic Lower Extremity Edema Chronic Venous Insufficiency  Patient has a a long history of chronic lower extremity edema due to varicose veins and venous insufficiency. S/p endovenous ablation of left greater saphenous vein in 05/2019. Last lower extremity venous doppler in 03/2022 showed no evidence of DVT but subcutaneous edema in calf and ankle bilaterally (left > right). - *** - Continue Lasix 40mg  daily.  - Continue compression stockings and sodium restrictions.  - ***  PAD ABIs in 08/2022 were indicative of moderate lower extremity arterial disease bilaterally (0.51 on the right and 0.69 on the left).  - No claudication. *** - Patient does not appear to be on aspirin or a statin; however, Vascular Surgery did recommend this at last visit in 09/2022. *** - Follow-up with Vascular Surgery as needed.   Hypertension BP well controlled. *** - Continue Coreg 25mg  twice daily and Lisinopril vs Losartan ***  Hyperlipidemia Lipid panel in  ***: - She is not currently on a statin. ***  CKD Stage III Baseline creatinine around 1.0. Stable at 0.97 on last labs in 10/2022.  Disposition: Follow up in ***   Signed, Corrin Parker, PA-C  12/17/2022 10:26 AM    [facility]

## 2022-12-17 ENCOUNTER — Ambulatory Visit: Payer: Medicare Other | Attending: Student | Admitting: Student

## 2022-12-17 ENCOUNTER — Encounter: Payer: Self-pay | Admitting: Student

## 2022-12-17 VITALS — BP 118/88 | HR 61 | Ht 61.0 in | Wt 191.0 lb

## 2022-12-17 DIAGNOSIS — N183 Chronic kidney disease, stage 3 unspecified: Secondary | ICD-10-CM | POA: Insufficient documentation

## 2022-12-17 DIAGNOSIS — E785 Hyperlipidemia, unspecified: Secondary | ICD-10-CM | POA: Diagnosis not present

## 2022-12-17 DIAGNOSIS — I10 Essential (primary) hypertension: Secondary | ICD-10-CM | POA: Diagnosis not present

## 2022-12-17 DIAGNOSIS — R609 Edema, unspecified: Secondary | ICD-10-CM

## 2022-12-17 DIAGNOSIS — R600 Localized edema: Secondary | ICD-10-CM | POA: Insufficient documentation

## 2022-12-17 DIAGNOSIS — I872 Venous insufficiency (chronic) (peripheral): Secondary | ICD-10-CM | POA: Insufficient documentation

## 2022-12-17 DIAGNOSIS — I739 Peripheral vascular disease, unspecified: Secondary | ICD-10-CM | POA: Insufficient documentation

## 2022-12-17 DIAGNOSIS — R0602 Shortness of breath: Secondary | ICD-10-CM | POA: Insufficient documentation

## 2022-12-17 NOTE — Patient Instructions (Addendum)
Medication Instructions:  Restart lasix 40 mg once a day  *If you need a refill on your cardiac medications before your next appointment, please call your pharmacy*   Lab Work: Today we will draw BMP & BNP In 1 weeks we will redraw BMP If you have labs (blood work) drawn today and your tests are completely normal, you will receive your results only by: MyChart Message (if you have MyChart) OR A paper copy in the mail If you have any lab test that is abnormal or we need to change your treatment, we will call you to review the results.   Testing/Procedures: Your physician has requested that you have an echocardiogram. Echocardiography is a painless test that uses sound waves to create images of your heart. It provides your doctor with information about the size and shape of your heart and how well your heart's chambers and valves are working. This procedure takes approximately one hour. There are no restrictions for this procedure. Please do NOT wear cologne, perfume, aftershave, or lotions (deodorant is allowed). Please arrive 15 minutes prior to your appointment time.  Please note: We ask at that you not bring children with you during ultrasound (echo/ vascular) testing. Due to room size and safety concerns, children are not allowed in the ultrasound rooms during exams. Our front office staff cannot provide observation of children in our lobby area while testing is being conducted. An adult accompanying a patient to their appointment will only be allowed in the ultrasound room at the discretion of the ultrasound technician under special circumstances. We apologize for any inconvenience.    Follow-Up: At [facility], you and your health needs are our priority.  As part of our continuing mission to provide you with exceptional heart care, we have created designated Provider Care Teams.  These Care Teams include your primary Cardiologist (physician) and Advanced Practice Providers  (APPs -  Physician Assistants and Nurse Practitioners) who all work together to provide you with the care you need, when you need it.  We recommend signing up for the patient portal called "MyChart".  Sign up information is provided on this After Visit Summary.  MyChart is used to connect with patients for Virtual Visits (Telemedicine).  Patients are able to view lab/test results, encounter notes, upcoming appointments, etc.  Non-urgent messages can be sent to your provider as well.   To learn more about what you can do with MyChart, go to [email or web address].    Your next appointment:    After echo is completed new patient.  Provider:   Weston Brass, MD  Other Instructions Heart Failure Education: Weigh yourself EVERY morning after you go to the bathroom but before you eat or drink anything. Write this number down in a weight log/diary. If you gain 3 pounds overnight or 5 pounds in a week, call the office. Take your medicines as prescribed. If you have concerns about your medications, please call us before you stop taking them.  Eat low salt foods--Limit salt (sodium) to 2000 mg per day. This will help prevent your body from holding onto fluid. Read food labels as many processed foods have a lot of sodium, especially canned goods and prepackaged meats. If you would like some assistance choosing low sodium foods, we would be happy to set you up with a nutritionist. Limit all fluids for the day to less than 2 liters (64 ounces). Fluid includes all drinks, coffee, juice, ice chips, soup, jello, and all other liquids. Stay as active  as you can everyday. Staying active will give you more energy and make your muscles stronger. Start with 5 minutes at a time and work your way up to 30 minutes a day. Break up your activities--do some in the morning and some in the afternoon. Start with 3 days per week and work your way up to 5 days as you can.  If you have chest pain, feel short of breath, dizzy, or  lightheaded, STOP. If you don't feel better after a short rest, call 911. If you do feel better, call the office to let us know you have symptoms with exercise.

## 2022-12-18 ENCOUNTER — Encounter: Payer: Self-pay | Admitting: Student

## 2022-12-18 LAB — BASIC METABOLIC PANEL
BUN/Creatinine Ratio: 12 (ref 12–28)
BUN: 12 mg/dL (ref 8–27)
CO2: 23 mmol/L (ref 20–29)
Calcium: 9.9 mg/dL (ref 8.7–10.3)
Chloride: 106 mmol/L (ref 96–106)
Creatinine, Ser: 1.03 mg/dL — ABNORMAL HIGH (ref 0.57–1.00)
Glucose: 83 mg/dL (ref 70–99)
Potassium: 4.6 mmol/L (ref 3.5–5.2)
Sodium: 139 mmol/L (ref 134–144)
eGFR: 53 mL/min/{1.73_m2} — ABNORMAL LOW (ref >59)

## 2022-12-18 LAB — BRAIN NATRIURETIC PEPTIDE: BNP: 235.8 pg/mL — ABNORMAL HIGH (ref 0.0–100.0)

## 2022-12-20 ENCOUNTER — Other Ambulatory Visit: Payer: Self-pay

## 2022-12-20 DIAGNOSIS — M4316 Spondylolisthesis, lumbar region: Secondary | ICD-10-CM | POA: Diagnosis not present

## 2022-12-20 DIAGNOSIS — I739 Peripheral vascular disease, unspecified: Secondary | ICD-10-CM

## 2022-12-20 DIAGNOSIS — E785 Hyperlipidemia, unspecified: Secondary | ICD-10-CM

## 2022-12-20 DIAGNOSIS — I872 Venous insufficiency (chronic) (peripheral): Secondary | ICD-10-CM

## 2022-12-20 DIAGNOSIS — R609 Edema, unspecified: Secondary | ICD-10-CM

## 2022-12-20 DIAGNOSIS — M47816 Spondylosis without myelopathy or radiculopathy, lumbar region: Secondary | ICD-10-CM | POA: Diagnosis not present

## 2022-12-20 DIAGNOSIS — R0989 Other specified symptoms and signs involving the circulatory and respiratory systems: Secondary | ICD-10-CM

## 2022-12-20 DIAGNOSIS — N183 Chronic kidney disease, stage 3 unspecified: Secondary | ICD-10-CM

## 2022-12-20 MED ORDER — ROSUVASTATIN CALCIUM 20 MG PO TABS: 20.0000 mg | ORAL_TABLET | Freq: Every day | ORAL | 3 refills | Status: DC

## 2022-12-20 MED ORDER — ASPIRIN 81 MG PO TBEC: 81.0000 mg | DELAYED_RELEASE_TABLET | Freq: Every day | ORAL | Status: AC

## 2022-12-23 NOTE — Progress Notes (Unsigned)
Patient Care Team: Garlan Fillers, MD as PCP - General (Internal Medicine) Carman Ching, MD (Inactive) (Gastroenterology) Kathryne Hitch, MD (Orthopedic Surgery) Maeola Harman, MD (Neurosurgery) Nelida Gores, MD (Ophthalmology) Petrinitz, Tinnie Gens, DPM (Inactive) (Podiatry) Patton Salles, MD (Obstetrics and Gynecology) Venancio Poisson, MD (Dermatology) Mckinley Jewel, MD (Ophthalmology)  Clinic Day:  12/24/2022  Referring physician: Garlan Fillers, MD  ASSESSMENT & PLAN:   Assessment & Plan: MGUS (monoclonal gammopathy of unknown significance) IgG Kappa MGUS: --Labs from 10/25/2021 showed SPEP with M protein of 0.8. IFE showed IgG monoclonal protein with kappa light chain specificity. UPEP was unremarkable. Kappa free light chain was elevated at 44 and kappa/lambda ration was elevated at 2.95 in the setting of chronic kidney disease.  --DG bone met survey from 11/01/2021 did not show lytic lesions. Next due around Oct 2024.  --Labs today show white blood cell 4.9, hemoglobin 12.7, MCV 94.7, and platelets of 201. MM panel and serum free light chains are pending.  --RTC in 6 months unless pending labs require further workup.     Plan: Labs reviewed  -CBC showing WBC 5.0; Hgb 13.9; Hct 39.6; Plt 224; Anc 2.9 -CMP - K 3.7; glucose 94; BUN 21; Creatinine 1.09; eGFR 56; Ca 10.2; LFTs normal.   -LDH normal -Multiple myeloma and kappa lambda light chain panels are pending. Patient is stable, requiring surveillance. Return to clinic in 6 months with labs.  Contact clinic for development of any new or worrisome symptoms.  The patient understands the plans discussed today and is in agreement with them.  She knows to contact our office if she develops concerns prior to her next appointment.  I provided 25 minutes of face-to-face time during this encounter and > 50% was spent counseling as documented under my assessment and plan.    Carlean Jews, NP  [facility] - A DEPT OF [facility] [address] [place] [address] Dept: [phone] Dept Fax: [phone]   No orders of the defined types were placed in this encounter.     CHIEF COMPLAINT:  CC: MGUS  Current Treatment:  surveillance  INTERVAL HISTORY:  [name] is here today for repeat clinical assessment. Ast saw Dr. Leonides Schanz on 06/13/2022.  States she saw neurosurgeon due to low back pain and neuropathy in her feet.  Since she is not a surgical candidate, no intervention recommended.  She does have swelling of both lower extremities.  Now taking diuretic.  Reports no new symptoms or concerns.  She denies fevers or chills. She denies pain. Her appetite is good. Her weight has increased 5 pounds over last 6 months .  I have reviewed the past medical history, past surgical history, social history and family history with the patient and they are unchanged from previous note.  ALLERGIES:  is allergic to silicone.  MEDICATIONS:  Current Outpatient Medications  Medication Sig Dispense Refill   aspirin EC 81 MG tablet Take 1 tablet (81 mg total) by mouth daily. Swallow whole.     brimonidine (ALPHAGAN) 0.2 % ophthalmic solution Place 1 drop into the left eye 3 (three) times daily.     carvedilol (COREG) 25 MG tablet Take 25 mg by mouth 2 (two) times daily.     dorzolamidel-timolol (COSOPT) 22.3-6.8 MG/ML SOLN ophthalmic solution Place 1 drop into both eyes 2 (two) times daily.     furosemide (LASIX) 40 MG tablet Take 40 mg by mouth  daily.     gabapentin (NEURONTIN) 300 MG capsule TAKE ONE CAPSULE AT BEDTIME 30 capsule 1   latanoprost (XALATAN) 0.005 % ophthalmic solution Place 1 drop into the left eye nightly.     rosuvastatin (CRESTOR) 20 MG tablet Take 1 tablet (20 mg total) by mouth daily. 90 tablet 3   selenium sulfide (SELSUN) 2.5 % shampoo Apply 1 Application topically 2 (two) times a week. 118 mL 0   timolol  (TIMOPTIC) 0.5 % ophthalmic solution Place 1 drop into both eyes 2 (two) times daily.     No current facility-administered medications for this visit.      REVIEW OF SYSTEMS:   Constitutional: Denies fevers, chills or abnormal weight loss Eyes: Denies blurriness of vision Ears, nose, mouth, throat, and face: Denies mucositis or sore throat Respiratory: Denies cough, dyspnea or wheezes Cardiovascular: Denies palpitation or chest discomfort.  She does have swelling in both lower extremities. Gastrointestinal:  Denies nausea, heartburn or change in bowel habits Skin: Denies abnormal skin rashes Lymphatics: Denies new lymphadenopathy or easy bruising Neurological:Denies numbness, tingling or new weaknesses Behavioral/Psych: Mood is stable, no new changes  All other systems were reviewed with the patient and are negative.   VITALS:   Today's Vitals   12/24/22 1341  BP: 126/74  Pulse: 72  Resp: 17  Temp: 97.8 F (36.6 C)  TempSrc: Temporal  SpO2: 97%    Wt Readings from Last 3 Encounters:  12/17/22 191 lb (86.6 kg)  11/01/22 186 lb (84.4 kg)  10/29/22 188 lb 6.4 oz (85.5 kg)    There is no height or weight on file to calculate BMI.  Performance status (ECOG): 1 - Symptomatic but completely ambulatory  PHYSICAL EXAM:   GENERAL:alert, no distress and comfortable SKIN: skin color, texture, turgor are normal, no rashes or significant lesions EYES: normal, Conjunctiva are pink and non-injected, sclera clear OROPHARYNX:no exudate, no erythema and lips, buccal mucosa, and tongue normal  NECK: supple, thyroid normal size, non-tender, without nodularity LYMPH:  no palpable lymphadenopathy in the cervical, axillary or inguinal LUNGS: clear to auscultation and percussion with normal breathing effort HEART: regular rate & rhythm and no murmurs.  She does have 1+ pitting edema in both lower extremities. ABDOMEN:abdomen soft, non-tender and normal bowel sounds Musculoskeletal:no  cyanosis of digits and no clubbing.  She is using a rolling walker to help with ambulation and mobility. NEURO: alert & oriented x 3 with fluent speech, no focal motor/sensory deficits  LABORATORY DATA:  I have reviewed the data as listed    Component Value Date/Time   NA 138 12/24/2022 1316   NA 139 12/17/2022 1102   K 3.7 12/24/2022 1316   CL 104 12/24/2022 1316   CO2 25 12/24/2022 1316   GLUCOSE 94 12/24/2022 1316   BUN 21 12/24/2022 1316   BUN 12 12/17/2022 1102   CREATININE 1.09 (H) 12/24/2022 1316   CALCIUM 10.2 12/24/2022 1316   PROT 7.9 12/24/2022 1316   ALBUMIN 4.5 12/24/2022 1316   AST 18 12/24/2022 1316   ALT 12 12/24/2022 1316   ALKPHOS 107 12/24/2022 1316   BILITOT 0.7 12/24/2022 1316   GFRNONAA 50 (L) 12/24/2022 1316   GFRAA 59 (L) 11/02/2016 1341    Lab Results  Component Value Date   WBC 5.0 12/24/2022   NEUTROABS 2.9 12/24/2022   HGB 13.9 12/24/2022   HCT 39.6 12/24/2022   MCV 95.7 12/24/2022   PLT 224 12/24/2022

## 2022-12-23 NOTE — Assessment & Plan Note (Signed)
IgG Kappa MGUS: --Labs from 10/25/2021 showed SPEP with M protein of 0.8. IFE showed IgG monoclonal protein with kappa light chain specificity. UPEP was unremarkable. Kappa free light chain was elevated at 44 and kappa/lambda ration was elevated at 2.95 in the setting of chronic kidney disease.  --DG bone met survey from 11/01/2021 did not show lytic lesions. Next due around Oct 2024.  --Labs today show white blood cell 5.0, hemoglobin 13.9, MCV 9 5 .7, and platelets of 224. MM panel and serum free light chains are pending.  --RTC in 6 months unless pending labs require further workup.

## 2022-12-24 ENCOUNTER — Inpatient Hospital Stay: Payer: Medicare Other | Attending: Nurse Practitioner

## 2022-12-24 ENCOUNTER — Other Ambulatory Visit: Payer: Self-pay | Admitting: Nurse Practitioner

## 2022-12-24 ENCOUNTER — Encounter: Payer: Self-pay | Admitting: Nurse Practitioner

## 2022-12-24 ENCOUNTER — Inpatient Hospital Stay (HOSPITAL_BASED_OUTPATIENT_CLINIC_OR_DEPARTMENT_OTHER): Payer: Medicare Other | Admitting: Nurse Practitioner

## 2022-12-24 ENCOUNTER — Other Ambulatory Visit: Payer: Self-pay

## 2022-12-24 VITALS — BP 126/74 | HR 72 | Temp 97.8°F | Resp 17

## 2022-12-24 DIAGNOSIS — G629 Polyneuropathy, unspecified: Secondary | ICD-10-CM | POA: Insufficient documentation

## 2022-12-24 DIAGNOSIS — D472 Monoclonal gammopathy: Secondary | ICD-10-CM | POA: Diagnosis not present

## 2022-12-24 DIAGNOSIS — M7989 Other specified soft tissue disorders: Secondary | ICD-10-CM | POA: Diagnosis not present

## 2022-12-24 DIAGNOSIS — M545 Low back pain, unspecified: Secondary | ICD-10-CM | POA: Insufficient documentation

## 2022-12-24 LAB — CBC WITH DIFFERENTIAL (CANCER CENTER ONLY)
Abs Immature Granulocytes: 0.01 10*3/uL (ref 0.00–0.07)
Basophils Absolute: 0.1 10*3/uL (ref 0.0–0.1)
Basophils Relative: 1 %
Eosinophils Absolute: 0.3 10*3/uL (ref 0.0–0.5)
Eosinophils Relative: 6 %
HCT: 39.6 % (ref 36.0–46.0)
Hemoglobin: 13.9 g/dL (ref 12.0–15.0)
Immature Granulocytes: 0 %
Lymphocytes Relative: 27 %
Lymphs Abs: 1.3 10*3/uL (ref 0.7–4.0)
MCH: 33.6 pg (ref 26.0–34.0)
MCHC: 35.1 g/dL (ref 30.0–36.0)
MCV: 95.7 fL (ref 80.0–100.0)
Monocytes Absolute: 0.5 10*3/uL (ref 0.1–1.0)
Monocytes Relative: 9 %
Neutro Abs: 2.9 10*3/uL (ref 1.7–7.7)
Neutrophils Relative %: 57 %
Platelet Count: 224 10*3/uL (ref 150–400)
RBC: 4.14 MIL/uL (ref 3.87–5.11)
RDW: 12.2 % (ref 11.5–15.5)
WBC Count: 5 10*3/uL (ref 4.0–10.5)
nRBC: 0 % (ref 0.0–0.2)

## 2022-12-24 LAB — CMP (CANCER CENTER ONLY)
ALT: 12 U/L (ref 0–44)
AST: 18 U/L (ref 15–41)
Albumin: 4.5 g/dL (ref 3.5–5.0)
Alkaline Phosphatase: 107 U/L (ref 38–126)
Anion gap: 9 (ref 5–15)
BUN: 21 mg/dL (ref 8–23)
CO2: 25 mmol/L (ref 22–32)
Calcium: 10.2 mg/dL (ref 8.9–10.3)
Chloride: 104 mmol/L (ref 98–111)
Creatinine: 1.09 mg/dL — ABNORMAL HIGH (ref 0.44–1.00)
GFR, Estimated: 50 mL/min — ABNORMAL LOW (ref >60)
Glucose, Bld: 94 mg/dL (ref 70–99)
Potassium: 3.7 mmol/L (ref 3.5–5.1)
Sodium: 138 mmol/L (ref 135–145)
Total Bilirubin: 0.7 mg/dL (ref <1.2)
Total Protein: 7.9 g/dL (ref 6.5–8.1)

## 2022-12-24 LAB — LACTATE DEHYDROGENASE: LDH: 192 U/L (ref 98–192)

## 2022-12-25 LAB — KAPPA/LAMBDA LIGHT CHAINS
Kappa free light chain: 48 mg/L — ABNORMAL HIGH (ref 3.3–19.4)
Kappa, lambda light chain ratio: 3.1 — ABNORMAL HIGH (ref 0.26–1.65)
Lambda free light chains: 15.5 mg/L (ref 5.7–26.3)

## 2022-12-26 NOTE — Progress Notes (Signed)
The kappa light chains slightly elevated. Waiting on multiple myeloma panel to come back.

## 2022-12-31 LAB — MULTIPLE MYELOMA PANEL, SERUM
Albumin SerPl Elph-Mcnc: 4.2 g/dL (ref 2.9–4.4)
Albumin/Glob SerPl: 1.4 (ref 0.7–1.7)
Alpha 1: 0.2 g/dL (ref 0.0–0.4)
Alpha2 Glob SerPl Elph-Mcnc: 0.7 g/dL (ref 0.4–1.0)
B-Globulin SerPl Elph-Mcnc: 0.9 g/dL (ref 0.7–1.3)
Gamma Glob SerPl Elph-Mcnc: 1.3 g/dL (ref 0.4–1.8)
Globulin, Total: 3.1 g/dL (ref 2.2–3.9)
IgA: 85 mg/dL (ref 64–422)
IgG (Immunoglobin G), Serum: 1450 mg/dL (ref 586–1602)
IgM (Immunoglobulin M), Srm: 53 mg/dL (ref 26–217)
M Protein SerPl Elph-Mcnc: 0.8 g/dL — ABNORMAL HIGH
Total Protein ELP: 7.3 g/dL (ref 6.0–8.5)

## 2023-01-01 DIAGNOSIS — H401123 Primary open-angle glaucoma, left eye, severe stage: Secondary | ICD-10-CM | POA: Diagnosis not present

## 2023-01-01 DIAGNOSIS — H401112 Primary open-angle glaucoma, right eye, moderate stage: Secondary | ICD-10-CM | POA: Diagnosis not present

## 2023-01-01 NOTE — Progress Notes (Signed)
M protein stable. Will continue to monitor in 6 months. Appointment scheduled for 06/2023

## 2023-01-24 DIAGNOSIS — L218 Other seborrheic dermatitis: Secondary | ICD-10-CM | POA: Diagnosis not present

## 2023-01-28 ENCOUNTER — Ambulatory Visit (HOSPITAL_COMMUNITY): Payer: Medicare Other

## 2023-02-04 ENCOUNTER — Ambulatory Visit (INDEPENDENT_AMBULATORY_CARE_PROVIDER_SITE_OTHER): Payer: Medicare Other | Admitting: Podiatry

## 2023-02-04 ENCOUNTER — Encounter: Payer: Self-pay | Admitting: Podiatry

## 2023-02-04 DIAGNOSIS — B351 Tinea unguium: Secondary | ICD-10-CM | POA: Diagnosis not present

## 2023-02-04 DIAGNOSIS — M79675 Pain in left toe(s): Secondary | ICD-10-CM | POA: Diagnosis not present

## 2023-02-04 DIAGNOSIS — M21619 Bunion of unspecified foot: Secondary | ICD-10-CM

## 2023-02-04 DIAGNOSIS — I739 Peripheral vascular disease, unspecified: Secondary | ICD-10-CM | POA: Diagnosis not present

## 2023-02-04 DIAGNOSIS — I872 Venous insufficiency (chronic) (peripheral): Secondary | ICD-10-CM

## 2023-02-04 DIAGNOSIS — M2041 Other hammer toe(s) (acquired), right foot: Secondary | ICD-10-CM | POA: Diagnosis not present

## 2023-02-04 DIAGNOSIS — M79674 Pain in right toe(s): Secondary | ICD-10-CM | POA: Diagnosis not present

## 2023-02-04 DIAGNOSIS — M2042 Other hammer toe(s) (acquired), left foot: Secondary | ICD-10-CM | POA: Diagnosis not present

## 2023-02-04 NOTE — Patient Instructions (Signed)
 I will place a referral to [facility]

## 2023-02-10 NOTE — Progress Notes (Signed)
 Subjective:   Patient ID: [name], female   DOB: 88 y.o.   MRN: [id number]   HPI Chief Complaint  Patient presents with   Ingrown Toenail    RM#11 2 ingrown toe nails both feet big toes.    88 year old female presents the office today for follow-up evaluation of foot pain.  She states that she wants to discuss what is going on with her foot because she does not remember.  She did follow-up with vascular surgery.  She also has incontinence with big toes which cause occasional discomfort.  She does curl down as well.  She does not report any open lesions.        Objective:  Physical Exam  General: AAO x3, NAD  Dermatological: There is mild erythema on the dorsal PIPJ's as well as the bunion from irritation but there is no skin breakdown or warmth.  Nails are hypertrophic, dystrophic and elongated and there is mild incurvation of the nail borders.  She gets tenderness nails 1-5 bilaterally.  Mild aggravation of the hallux toenails without any signs of infection today.  No open lesions.  No ulcerations or signs of infection.  Vascular: Dorsalis Pedis artery and Posterior Tibial artery pedal pulses are decreased bilateral There is no pain with calf compression, swelling, warmth, erythema.   Neruologic: Grossly intact via light touch bilateral.   Musculoskeletal: Moderate severe bunions are present with semirigid hammertoe contractures of the second digit and flexible contractures of the other digits.  There is tenderness palpation along the toes himself.  There is a symmetrical bilateral lower extremity edema present.  No pain with calf compression.    Assessment:   88 year old female with bunion, hammertoes, likely neuropathy     Plan:  -Treatment options discussed including all alternatives, risks, and complications -We had again about long discussion regards to her issues.  She did follow-up with vascular surgery.  Recommended against any elective surgery she is concerned with  the swelling.  I will refer to [facility] and she would like another opinion.   -I also think some of her symptoms are due to neuropathy which we discussed.  Gabapentin . -Discussed bunion, hammertoes.  Recommend against any surgical invention.  Dispensed toe crest.  Discussed shoes to avoid excess pressure. -Debrided nails with any complications or bleeding.  Return in about 3 months (around 05/05/2023) for nail trim, foot pain.  [name] DPM

## 2023-02-12 ENCOUNTER — Telehealth: Payer: Self-pay

## 2023-02-12 NOTE — Telephone Encounter (Signed)
-----   Message from Vivi Barrack sent at 02/10/2023  7:59 AM EST ----- Can you please fax referral to [facility]? It is in under an internal medicine referral. Thanks!

## 2023-02-12 NOTE — Telephone Encounter (Signed)
 Referral, office note and demographics faxed to [facility] Phone [phone], fax [phone]

## 2023-02-25 ENCOUNTER — Ambulatory Visit (HOSPITAL_COMMUNITY): Payer: Medicare Other | Attending: Student

## 2023-02-25 DIAGNOSIS — R0602 Shortness of breath: Secondary | ICD-10-CM | POA: Diagnosis not present

## 2023-02-25 LAB — ECHOCARDIOGRAM COMPLETE
Area-P 1/2: 2.63 cm2
P 1/2 time: 390 ms
S' Lateral: 2.7 cm

## 2023-02-27 ENCOUNTER — Other Ambulatory Visit: Payer: Medicare Other

## 2023-03-11 ENCOUNTER — Ambulatory Visit (INDEPENDENT_AMBULATORY_CARE_PROVIDER_SITE_OTHER): Payer: Medicare Other | Admitting: Podiatry

## 2023-03-11 ENCOUNTER — Encounter: Payer: Self-pay | Admitting: Podiatry

## 2023-03-11 DIAGNOSIS — M2041 Other hammer toe(s) (acquired), right foot: Secondary | ICD-10-CM | POA: Diagnosis not present

## 2023-03-11 DIAGNOSIS — M2042 Other hammer toe(s) (acquired), left foot: Secondary | ICD-10-CM

## 2023-03-11 NOTE — Progress Notes (Signed)
 Chief Complaint  Patient presents with   Foot Pain    Patient states she is having bilateral foot pain , she has two hammer toes , because of the edema her feet swell up and patient has Callouses on bilateral feet.     HPI: 88 y.o. female presenting today for second opinion regarding bilateral foot pain.  Patient also went into great length in detail about her chronic edema to the lower extremities bilaterally.  She is very unsatisfied and says that her physicians do not have any good options to cure her edema.  Patient also has chronic pain to the bilateral toes that she would like to have addressed.  Past Medical History:  Diagnosis Date   Arthritis    Bilateral edema of lower extremity    CKD (chronic kidney disease)    Constipation    Degenerative arthritis of hip    s/p THR 05/2011   Depression    Glaucoma    Headache(784.0)    HLD (hyperlipidemia)    Hypertension    OAB (overactive bladder)    Osteoporosis    Primary osteoarthritis of right knee    Slow transit constipation    Unsteady gait    Venous insufficiency     Past Surgical History:  Procedure Laterality Date    cataracts removed  2013 L   ABDOMINAL HYSTERECTOMY  2003   BREAST BIOPSY  1960   BREAST CYSTS     X2 BENIGN   BREAST EXCISIONAL BIOPSY Left    BREAST SURGERY     ENDOVENOUS ABLATION SAPHENOUS VEIN W/ LASER Left 06/04/2019   endovenous laser ablation left greater saphenous vein by Kirtland Perfect MD    GLAUCOMA VALVE INSERTION Bilateral 2013, 2014   bond ([facility])   REDUCTION MAMMAPLASTY Bilateral 2000   TOTAL HIP ARTHROPLASTY  06/01/2011   Procedure: TOTAL HIP ARTHROPLASTY ANTERIOR APPROACH;  Surgeon: Arnie Lao, MD;  Location: [facility];  Service: Orthopedics;  Laterality: Left;  Left Total Hip Replacement, Direct Anterior Approach   TOTAL KNEE ARTHROPLASTY Right 03/14/2015   Procedure: TOTAL KNEE ARTHROPLASTY;  Surgeon: Liliane Rei, MD;  Location: [facility];  Service: Orthopedics;  Laterality:  Right;    Allergies  Allergen Reactions   Silicone Rash     Physical Exam: General: The patient is alert and oriented x3 in no acute distress.  Dermatology: Skin is warm, dry and supple bilateral lower extremities.   Vascular: Palpable pedal pulses bilaterally. Capillary refill within normal limits.  No appreciable edema.  No erythema.  Neurological: Grossly intact via light touch  Musculoskeletal Exam: Hammertoe deformity noted bilateral with hallux valgus deformity bilateral.  Associated tenderness with palpation of the distal tips of the toes  Assessment/Plan of Care: 1.  Hammertoes bilateral 2.  Hallux valgus bilateral 3.  Chronic lower extremity edema bilateral  -Patient evaluated -Unna boot multilayer compression wraps were offered for the patient but ultimately she declined -Additional debridement of the toenails bilateral was performed using a nail nipper and smoothed with a rotary bur without incident or bleeding as a courtesy for the patient.  No charge -Do not recommend surgery for the patient.  Due to the heavy edema of the lower extremities and the patient's age with comorbidities recommend conservative treatment including good supportive tennis shoes and sneakers -Return to clinic as needed       Dot Gazella, DPM [facility]  Dr. Dot Gazella, DPM    [address]  [place], [place] [address]                Office [phone]  Fax [phone]

## 2023-03-25 ENCOUNTER — Ambulatory Visit: Payer: Medicare Other | Attending: Internal Medicine | Admitting: Internal Medicine

## 2023-03-25 ENCOUNTER — Encounter: Payer: Self-pay | Admitting: Internal Medicine

## 2023-03-25 ENCOUNTER — Telehealth: Payer: Self-pay | Admitting: Internal Medicine

## 2023-03-25 VITALS — BP 184/76 | HR 59 | Ht 61.0 in | Wt 189.4 lb

## 2023-03-25 DIAGNOSIS — R600 Localized edema: Secondary | ICD-10-CM | POA: Insufficient documentation

## 2023-03-25 DIAGNOSIS — I739 Peripheral vascular disease, unspecified: Secondary | ICD-10-CM | POA: Diagnosis not present

## 2023-03-25 DIAGNOSIS — E785 Hyperlipidemia, unspecified: Secondary | ICD-10-CM | POA: Diagnosis not present

## 2023-03-25 DIAGNOSIS — I447 Left bundle-branch block, unspecified: Secondary | ICD-10-CM | POA: Diagnosis not present

## 2023-03-25 DIAGNOSIS — I872 Venous insufficiency (chronic) (peripheral): Secondary | ICD-10-CM | POA: Insufficient documentation

## 2023-03-25 DIAGNOSIS — I10 Essential (primary) hypertension: Secondary | ICD-10-CM | POA: Insufficient documentation

## 2023-03-25 MED ORDER — VALSARTAN 80 MG PO TABS: 80.0000 mg | ORAL_TABLET | Freq: Every day | ORAL | 3 refills | Status: DC

## 2023-03-25 NOTE — Progress Notes (Signed)
 Cardiology Office Note:  .   Date:  03/25/2023  ID:  [name], DOB [birth year], MRN [id number] PCP: Garlan Fillers, MD  [facility] Providers Cardiologist:  Parke Poisson, MD    History of Present Illness: .   [name] is a 88 y.o. female.  Discussed the use of AI scribe software for clinical note transcription with the patient, who gave verbal consent to proceed.  History of Present Illness   The patient, with a history of left bundle branch block, chronic lower extremity edema secondary to venous insufficiency, peripheral arterial disease, hypertension, hyperlipidemia, and CKD stage three, presents with worsening lower extremity edema and new shortness of breath with exertion over the past year. The patient reports difficulty in ambulation, becoming short of breath after 20-30 feet. The patient does not routinely take Lasix due to urinary frequency and takes it as needed. The patient has mild sleep apnea but does not use CPAP. The patient was recommended to restart Lasix 40mg  daily at the last visit, however she continues to take 20 mg as needed. The patient's blood pressure is significantly elevated despite being prescribed carvedilol 25mg  BID. Also on Crestor 20mg  daily.        ROS: negative except per HPI above.  Studies Reviewed: [name]   EKG Interpretation Date/Time:  Monday March 25 2023 13:53:25 EST Ventricular Rate:  59 PR Interval:  230 QRS Duration:  138 QT Interval:  520 QTC Calculation: 514 R Axis:   -19  Text Interpretation: Sinus bradycardia with 1st degree A-V block Left bundle branch block When compared with ECG of 17-Dec-2022 10:10, T wave inversion more evident in Lateral leads Confirmed by Weston Brass ([id number]) on 03/25/2023 2:27:13 PM    Results   DIAGNOSTIC Echocardiogram: Normal ejection fraction, grade one diastolic dysfunction, normal global longitudinal strain, normal right heart size and function, moderately elevated pulmonary  artery systolic pressure of 48 mmHg, mild mitral valve regurgitation, mild aortic valve regurgitation, RA pressure estimate 3 mmHg (01/2023) EKG: Normal heart rhythm, left bundle branch block, first degree AV block, T wave abnormality     Risk Assessment/Calculations:    Physical Exam:   VS:  BP (!) 184/76   Pulse (!) 59   Ht 5\' 1"  (1.549 m)   Wt 189 lb 6.4 oz (85.9 kg)   SpO2 92%   BMI 35.79 kg/m    Wt Readings from Last 3 Encounters:  03/25/23 189 lb 6.4 oz (85.9 kg)  12/17/22 191 lb (86.6 kg)  11/01/22 186 lb (84.4 kg)     Physical Exam   VITALS: BP- 184/76 CARDIOVASCULAR: Heart sounds normal.     GEN: Well nourished, well developed in no acute distress NECK: No JVD; No carotid bruits CARDIAC: RRR, no murmurs, rubs, gallops RESPIRATORY:  Clear to auscultation without rales, wheezing or rhonchi  ABDOMEN: Soft, non-tender, non-distended EXTREMITIES:  bilateral edema; No deformity   ASSESSMENT AND PLAN: .    1. Primary hypertension   2. PAD (peripheral artery disease) (HCC)   3. Bilateral lower extremity edema   4. Chronic venous insufficiency   5. LBBB (left bundle branch block)   6. Hyperlipidemia, unspecified hyperlipidemia type     Assessment and Plan    Chronic Lower Extremity Edema Likely secondary to venous insufficiency. Patient has difficulty with consistent Lasix use due to urinary frequency. Compression stockings are not well tolerated due to discomfort and swelling of the foot. -Recommend custom fitted compression stockings. -Encourage Lasix use as  much as tolerable.  Hypertension Despite Carvedilol 25mg  BID, blood pressure remains significantly elevated. -Start Valsartan 80mg  daily. -Check labs in 1 week to assess kidney function. She plans to have this with her PCP with a visit 1 week later.   Hyperlipidemia Patient is on Crestor 20mg  daily. -Continue current management.  Peripheral Arterial Disease Patient follows with vascular  surgery. -Continue current management.  Follow-up -Primary care doctor in 2 weeks. -Return to this clinic in 3 months with Callie and in 6 months with this provider.

## 2023-03-25 NOTE — Telephone Encounter (Signed)
 Patient identification verified by 2 forms. Shade Flood, RN     Tried calling patient but phone went straight to vm. LDVM regarding losartan (COZAAR) 100mg  tablet. LVM informing patient losartan was d/c on 12/18/2022 and that she she stop taking it at home.

## 2023-03-25 NOTE — Telephone Encounter (Signed)
 Pt c/o medication issue:  1. Name of Medication:    2. How are you currently taking this medication (dosage and times per day)?    3. Are you having a reaction (difficulty breathing--STAT)? no  4. What is your medication issue? Pharmacy states that we need to contact the patient. To inform her that she doesn't need to take the above medication. Please advise

## 2023-03-25 NOTE — Patient Instructions (Addendum)
 Medication Instructions:  Start: Valsartan (Diovan) 80 mg once daily  Lab Work: At your PCP next week  Testing/Procedures: None   Follow-Up: At [facility], you and your health needs are our priority.  As part of our continuing mission to provide you with exceptional heart care, we have created designated Provider Care Teams.  These Care Teams include your primary Cardiologist (physician) and Advanced Practice Providers (APPs -  Physician Assistants and Nurse Practitioners) who all work together to provide you with the care you need, when you need it.  Your next appointment:    5/21/025 at 1:30 pm  Provider:   Marjie Skiff, PA-C    Then, Parke Poisson, MD will plan to see you again in 6 month(s).    Other Instructions Please wear 20 mmHg Compression Hose during the day.  Please have your PCP fax [place] your lab results to [phone] or route in Epic (electronic records)

## 2023-03-26 NOTE — Telephone Encounter (Signed)
 Patient called back, I inform her to stop taking the Losartan. Please advise

## 2023-03-26 NOTE — Telephone Encounter (Signed)
 Patient identification verified by 2 forms. Shannon Rail, RN    Called and spoke to patient  Patient states:   -was previously confused about taking losartan and valsartan  Informed patient:    -D/c Losartan   -Start Valsartan 80mg    -continue carvedilol 25mg  BID  Patient verbalized understanding, no questions at this time

## 2023-04-08 DIAGNOSIS — I10 Essential (primary) hypertension: Secondary | ICD-10-CM | POA: Diagnosis not present

## 2023-04-08 DIAGNOSIS — R82998 Other abnormal findings in urine: Secondary | ICD-10-CM | POA: Diagnosis not present

## 2023-04-26 ENCOUNTER — Ambulatory Visit (HOSPITAL_COMMUNITY)
Admission: RE | Admit: 2023-04-26 | Discharge: 2023-04-26 | Disposition: A | Discharge status: Home / Self Care | Source: Ambulatory Visit | Attending: Registered Nurse | Admitting: Registered Nurse

## 2023-04-26 ENCOUNTER — Other Ambulatory Visit (HOSPITAL_COMMUNITY): Payer: Self-pay | Admitting: Registered Nurse

## 2023-04-26 DIAGNOSIS — N3281 Overactive bladder: Secondary | ICD-10-CM | POA: Diagnosis present

## 2023-04-26 DIAGNOSIS — R1012 Left upper quadrant pain: Secondary | ICD-10-CM | POA: Insufficient documentation

## 2023-04-26 DIAGNOSIS — R0602 Shortness of breath: Secondary | ICD-10-CM | POA: Diagnosis present

## 2023-04-26 DIAGNOSIS — I10 Essential (primary) hypertension: Secondary | ICD-10-CM | POA: Insufficient documentation

## 2023-04-26 LAB — POCT I-STAT CREATININE: Creatinine, Ser: 1.1 mg/dL — ABNORMAL HIGH (ref 0.44–1.00)

## 2023-04-26 MED ORDER — IOHEXOL 300 MG/ML  SOLN
100.0000 mL | Freq: Once | INTRAMUSCULAR | Status: AC | PRN
  Administered 2023-04-26: 75 mL via INTRAVENOUS

## 2023-04-29 ENCOUNTER — Other Ambulatory Visit: Payer: Self-pay | Admitting: Radiology

## 2023-05-23 ENCOUNTER — Encounter: Payer: Self-pay | Admitting: Radiology

## 2023-06-05 ENCOUNTER — Ambulatory Visit (INDEPENDENT_AMBULATORY_CARE_PROVIDER_SITE_OTHER): Payer: PRIVATE HEALTH INSURANCE | Admitting: Podiatry

## 2023-06-05 DIAGNOSIS — I872 Venous insufficiency (chronic) (peripheral): Secondary | ICD-10-CM | POA: Diagnosis not present

## 2023-06-05 DIAGNOSIS — R609 Edema, unspecified: Secondary | ICD-10-CM | POA: Diagnosis not present

## 2023-06-05 DIAGNOSIS — I739 Peripheral vascular disease, unspecified: Secondary | ICD-10-CM

## 2023-06-05 NOTE — Progress Notes (Signed)
  Subjective:  Patient ID: [name], female    DOB: December 01, 1935,   MRN: [id number]  No chief complaint on file.   88 y.o. female presents for multiple concerns including edema in her legs and pains in her toes. She relates history of bunions and hammertoes but gets reoccuring pain in her toes. Does have history of neuropathy. Refuses gabapentin . Relates she is most concerned with edema and would like to resolve this. Also relates specific concern for lump on right third toe.  . Denies any other pedal complaints. Denies n/v/f/c.   Past Medical History:  Diagnosis Date   Arthritis    Bilateral edema of lower extremity    CKD (chronic kidney disease)    Constipation    Degenerative arthritis of hip    s/p THR 05/2011   Depression    Glaucoma    Headache(784.0)    HLD (hyperlipidemia)    Hypertension    OAB (overactive bladder)    Osteoporosis    Primary osteoarthritis of right knee    Slow transit constipation    Unsteady gait    Venous insufficiency     Objective:  Physical Exam: Vascular: DP/PT pulses 2/4 bilateral. CFT <3 seconds. Absent hair growth on digits. Edema noted to bilateral lower extremities. Xerosis noted bilaterally.  Skin. No lacerations or abrasions bilateral feet. Nails 1-5 bilateral  are thickened discolored and elongated with subungual debris.  Musculoskeletal: MMT 5/5 bilateral lower extremities in DF, PF, Inversion and Eversion. Deceased ROM in DF of ankle joint. HAV deformity and hammered digits 2-5 bilateral. Mild bony prominence in between second and thrid digit on right third.  Neurological: Sensation intact to light touch. Protective sensation diminished bilateral.    Assessment:   1. Swelling   2. PAD (peripheral artery disease) (HCC)   3. Venous (peripheral) insufficiency      Plan:  Patient was evaluated and treated and all questions answered. Discussed neuropathy and etiology as well as treatment with patient.  Radiographs reviewed and  discussed with patient.  -Discussed and educated patient onfoot care, especially with  regards to the vascular, neurological and musculoskeletal systems.  -Stressed the importance of good glycemic control and the detriment of not  controlling glucose levels in relation to the foot. -Discussed supportive shoes at all times and checking feet regularly.  -Educated on hammertoes and treatment options  -Padding provided  -Discussed edema and discussed referral to lymphedema clinic for new options. Patient would like to proceed with this.  -Patient to follow-up as needed. Discussed calling if any changes or increased pain.    Jennefer Moats, DPM

## 2023-06-09 NOTE — Progress Notes (Signed)
 Cardiology Office Note:    Date:  06/19/2023   ID:  [name], DOB 02-26-1935, MRN [id number]  PCP:  Bertha Broad, MD  Cardiologist:  Euell Herrlich, MD     Referring MD: Bertha Broad, MD   Chief Complaint: follow-up of lower extremity edema   History of Present Illness:    [name] is a 88 y.o. female with a history of LBBB, chronic lower extremity edema secondary to venous insufficiency, mild mitral regurgitation, PAD with moderately reduced ABIs bilaterally in 08/2022, hypertension, hyperlipidemia, CKD stage III, and MGUS who presents today for follow-up of lower extremity edema.   Patient was previously followed by Dr. Santiago Cuff and Dr. Felipe Horton. She has a known LBBB. Myoview in 07/2013 was negative for ischemia. She also has a long history of chronic lower extremity edema felt to be secondary to venous varicosity and venous insufficiency (left leg chronically larger than the right).. She has previously been seen by Vascular Surgery for this and an ablation was recommended but never happened. She was not felt to have any active cardiac problems at that tim and was advised to follow back up with Vascular Surgery. She ultimately underwent a endovenous ablation of left greater saphenous vein in 05/2019. Last venous lower extremity ultrasound in 03/2022 showed no evidence of DVT but subcutaneous edema in calf and ankle bilaterally (left > right). ABIs in 08/2022 were indicative of moderate lower extremity arterial disease bilaterally (0.51 on the right and 0.69 on the left). He was seen by Vascular Surgery following this who recommended conservative/ medical therapy for treatment of PAD.   At visit with me in 11/2022, she reported worsening lower extremity and new shortness of breath. BNP was elevated at 235. She was restarted on Lasix  40mg  daily. Echo was also ordered. However, this was not completed until 01/2023 and showed LVEF of 60-65% with moderate asymmetric LVH of the  basal-septal segment and grade 1 diastolic dysfunction, normal RV function, and mild MR.   She was last seen by Dr. Chancy Comber in 03/2023 at which time she continue to have some dyspnea on exertion and lower extremity edema but was not taking her Lasix  every day due to urinary frequency. Lower extremity edema was felt to due to venous insufficiency not CHF. Her BP was markedly elevated. She was started on Valsartan  and was encouraged to use her Lasix  as much as possible.   Patient presents today for follow-up. Here alone.  She denies any chest pain.  She notes persistent dyspnea on exertion but states this is not any worse than usual. She states she can only walk about 50 ft before getting short of breath. No orthopnea or PND.  She has been to modify her diet and has cut out a lot of sugar and artificial sweetener.  She has noticed that her chronic lower extremity edema has improved some with this.  She does describe a mechanical fall about 1 week ago when she tripped on the carpet.  She denies hitting her head during the fall but did land on her right side.  She initially had was having pain in her right shoulder, hip, and toe after the fall but this is essentially resolved.  She denies any dizziness prior to the fall.  She lives alone at [employer] states she will often talk to herself.  She noticed that 3 nights ago when she was talking to herself, she also did had difficulty getting her words out and eventually got  so frustrated she just went to sleep.  When she woke up in the morning, she was fine.  This sounds like a TIA.  She denies any other stroke-like symptoms at the time.  However, she does note that she has had some intermittent dizziness since then especially at night.  No syncope.  EKGs/Labs/Other Studies Reviewed:    The following studies were reviewed:  Myoview 08/28/2013: Normal. No evidence of ischemia. EF 60%.  _______________   Lower Extremity Venous Dopplers 04/10/2022: Summary:   BILATERAL:  - No evidence of deep vein thrombosis seen in the lower extremities,  bilaterally.  -No evidence of popliteal cyst, bilaterally.  -Subcutaneous edema seen in calf and ankle, bilaterally. L > R  _______________   ABIs/ TBIs 09/10/2022: Summary:  Right: Resting right ankle-brachial index indicates moderate right lower  extremity arterial disease. The right toe-brachial index is abnormal. PPG  tracings appear dampened.   Left: Resting left ankle-brachial index indicates moderate left lower  extremity arterial disease. The left toe-brachial index is abnormal. _______________  Echocardiogram 02/25/2023: Impressions: 1. Left ventricular ejection fraction, by estimation, is 60 to 65%. The  left ventricle has normal function. The left ventricle has no regional  wall motion abnormalities. There is moderate asymmetric left ventricular  hypertrophy of the basal-septal  segment. No LV outflow tract gradient or mitral valve systolic anterior  motion. Left ventricular diastolic parameters are consistent with Grade I  diastolic dysfunction (impaired relaxation). The average left ventricular  global longitudinal strain is  -23.9 %. The global longitudinal strain is normal.   2. Right ventricular systolic function is normal. The right ventricular  size is normal. There is moderately elevated pulmonary artery systolic  pressure. The estimated right ventricular systolic pressure is 47.6 mmHg.   3. The mitral valve is normal in structure. Mild mitral valve  regurgitation. No evidence of mitral stenosis.   4. The aortic valve is tricuspid. Aortic valve regurgitation is mild. No  aortic stenosis is present.   5. The inferior vena cava is normal in size with greater than 50%  respiratory variability, suggesting right atrial pressure of 3 mmHg.    EKG:  EKG not ordered today.   Recent Labs: 11/01/2022: TSH 1.310 12/17/2022: BNP 235.8 12/24/2022: ALT 12; BUN 21; Hemoglobin 13.9;  Platelet Count 224; Potassium 3.7; Sodium 138 04/26/2023: Creatinine, Ser 1.10  Recent Lipid Panel    Component Value Date/Time   CHOL 163 07/10/2013 1229   TRIG 87.0 07/10/2013 1229   HDL 56.70 07/10/2013 1229   CHOLHDL 3 07/10/2013 1229   VLDL 17.4 07/10/2013 1229   LDLCALC 89 07/10/2013 1229    Physical Exam:    Vital Signs: BP (!) 192/90 (BP Location: Right Arm, Patient Position: Sitting)   Pulse 64   Ht 5' (1.524 m)   Wt 182 lb 8 oz (82.8 kg)   BMI 35.64 kg/m     Wt Readings from Last 3 Encounters:  06/19/23 182 lb 8 oz (82.8 kg)  03/25/23 189 lb 6.4 oz (85.9 kg)  12/17/22 191 lb (86.6 kg)     General: 88 y.o. Caucasian female in no acute distress. HEENT: Normocephalic and atraumatic. Sclera clear.  Neck: Supple. No carotid bruits. No JVD. Heart: RRR. No murmurs, gallops, or rubs.  Lungs: No increased work of breathing. Clear to ausculation bilaterally.  Extremities: 1+ pitting edema of bilateral lower extremities. No lower extremity edema. Skin: Warm and dry. Neuro: No focal deficits. Psych: Normal affect. Responds appropriately.  Assessment:  1. Primary hypertension   2. Bilateral lower extremity edema   3. Chronic venous insufficiency   4. PAD (peripheral artery disease) (HCC)   5. Hyperlipidemia, unspecified hyperlipidemia type   6. Stage 3 chronic kidney disease, unspecified whether stage 3a or 3b CKD (HCC)   7. Word finding difficulty   8. Dizziness     Plan:    Hypertension BP elevated in the office. Initially 140/78. However, on my personal recheck at the end of visit, I got 196/80 on the left arm and 192/90 on the right arm. She has not been checking her BP at home. - Current medications: Valsartan  80mg  daily and Coreg  25mg  twice daily.  - Will increase Valsartan  to 160mg  daily and continue current dose of Coreg . - Advised patient to go home and take additional dose of Valsartan  80mg  as well as her evening dose of Coreg . Recommend she check her  BP 2 hours after this and let us  know if systolic BP remains >170. She can then start Valsartan  160mg  tomorrow. - Will repeat BMET in 1 week. - Asked patient to keep BP/HR log for 2 week and then send this to us . She states she will get a nurse at [facility] to do this for her.  Chronic Lower Extremity Edema Chronic Venous Insufficiency  Patient has a a long history of chronic lower extremity edema due to varicose veins and venous insufficiency. S/p endovenous ablation of left greater saphenous vein in 05/2019. Last lower extremity venous doppler in 03/2022 showed no evidence of DVT but subcutaneous edema in calf and ankle bilaterally (left > right). Last Echo in 01/2023 showed LVEF of 60-65% with moderate asymmetric LVH of the basal-septal segment and grade 1 diastolic dysfunction, normal RV function, and mild MR.  - Stable. She states this has actually improved some with cutting out sugar and artifical sweeteners.  - She was previously prescribed Lasix  40mg  daily but has not been taking this due to increased urinary frequency. Strongly encouraged patient to take at least 20mg  three times a week if possible. She will try to do this. - Continue compression stockings and sodium restrictions.    Chronic Dyspnea  Patient has a history of dyspnea on exertion. Echo in 01/2023 showed 60-65% with moderate asymmetric LVH of the basal-septal segment and grade 1 diastolic dysfunction, normal RV function, and mild MR.  - She continues to have dyspnea on exertion but this is stable. - She may have some mild diastolic CHF. I have previously recommended daily Lasix  but she will not take this due to increased urinary frequency. Recommended she at least take 20mg  three times weekly. She will try to do this.  Mild Mitral Regurgitation Noted on Echo in 01/2023.  - Can continue to monitor with routine serial imaging.   PAD ABIs in 08/2022 were indicative of moderate lower extremity arterial disease bilaterally (0.51 on  the right and 0.69 on the left).  - Continue Aspirin  81mg  daily and Crestor  20mg  daily.  - Follow-up with Vascular Surgery as  needed.     Hyperlipidemia Lipid panel in 03/2023 (per [facility]): Total Cholesterol 112, Triglycerides 58, HDL 62, LDL 38. LDL goal <70 given PAD.  - Continue Crestor  20mg  daily.   CKD Stage III Baseline creatinine around 1.0.   Possible TIA Word Finding Difficulties  Dizziness  Patient report trouble word finding 3 nights ago. She eventually got frustrated and went to sleep. When she woke up the next morning, she was fine. However, she does report some  intermittent dizziness (mostly at night) since then. This sounds like a possible TIA.  - Continue aspirin  and statin. - She is outside the window for TPA so will not send her to the ED. Will order brain MRI and refer to Neurology.  - Reviewed stroke symptoms and discussed importance of calling 911 if she has any recurrent stroke like symptoms.  Disposition: Follow up in 1 month.    Signed, Casimer Clear, PA-C  06/19/2023 3:55 PM    [facility]

## 2023-06-19 ENCOUNTER — Encounter: Payer: Self-pay | Admitting: Student

## 2023-06-19 ENCOUNTER — Other Ambulatory Visit (HOSPITAL_COMMUNITY): Payer: Self-pay

## 2023-06-19 ENCOUNTER — Ambulatory Visit: Payer: PRIVATE HEALTH INSURANCE | Attending: Student | Admitting: Student

## 2023-06-19 VITALS — BP 192/90 | HR 64 | Ht 60.0 in | Wt 182.5 lb

## 2023-06-19 DIAGNOSIS — I739 Peripheral vascular disease, unspecified: Secondary | ICD-10-CM

## 2023-06-19 DIAGNOSIS — R600 Localized edema: Secondary | ICD-10-CM

## 2023-06-19 DIAGNOSIS — R4789 Other speech disturbances: Secondary | ICD-10-CM

## 2023-06-19 DIAGNOSIS — N183 Chronic kidney disease, stage 3 unspecified: Secondary | ICD-10-CM

## 2023-06-19 DIAGNOSIS — I872 Venous insufficiency (chronic) (peripheral): Secondary | ICD-10-CM | POA: Diagnosis not present

## 2023-06-19 DIAGNOSIS — R42 Dizziness and giddiness: Secondary | ICD-10-CM

## 2023-06-19 DIAGNOSIS — I10 Essential (primary) hypertension: Secondary | ICD-10-CM

## 2023-06-19 DIAGNOSIS — E785 Hyperlipidemia, unspecified: Secondary | ICD-10-CM

## 2023-06-19 MED ORDER — VALSARTAN 160 MG PO TABS
160.0000 mg | ORAL_TABLET | Freq: Every day | ORAL | 2 refills | Status: DC
  Filled 2023-06-19: qty 30, 30d supply, fill #0

## 2023-06-19 NOTE — Patient Instructions (Addendum)
 Medication Instructions:  Take Valsartan  80 mg when you get home Also take pm dose of Carvedilol   Increase Valsartan  to 160 mg daily Continue all other medications *If you need a refill on your cardiac medications before your next appointment, please call your pharmacy*  Lab Work: Bmet in 1 week Thursday 5/29  Testing/Procedures: MRI of Brain Saturday 5/24 at [facility] Arrive at 9:45 am at Emergency Dept   Follow-Up: At [facility], you and your health needs are our priority.  As part of our continuing mission to provide you with exceptional heart care, our providers are all part of one team.  This team includes your primary Cardiologist (physician) and Advanced Practice Providers or APPs (Physician Assistants and Nurse Practitioners) who all work together to provide you with the care you need, when you need it.  Your next appointment:  1 month    Provider:  Callie Goodrich PA        Neurology to call with appointment      Check blood pressure and pulse daily for 2 weeks send readings through mychart   If Blood pressure greater than 170 call office     We recommend signing up for the patient portal called "MyChart".  Sign up information is provided on this After Visit Summary.  MyChart is used to connect with patients for Virtual Visits (Telemedicine).  Patients are able to view lab/test results, encounter notes, upcoming appointments, etc.  Non-urgent messages can be sent to your provider as well.   To learn more about what you can do with MyChart, go to [email or web address].

## 2023-06-22 ENCOUNTER — Ambulatory Visit (HOSPITAL_COMMUNITY)
Admission: RE | Admit: 2023-06-22 | Discharge: 2023-06-22 | Disposition: A | Discharge status: Home / Self Care | Source: Ambulatory Visit | Attending: Student | Admitting: Student

## 2023-06-22 DIAGNOSIS — R42 Dizziness and giddiness: Secondary | ICD-10-CM | POA: Insufficient documentation

## 2023-06-22 DIAGNOSIS — R4789 Other speech disturbances: Secondary | ICD-10-CM | POA: Diagnosis present

## 2023-06-22 MED ORDER — GADOBUTROL 1 MMOL/ML IV SOLN
8.0000 mL | Freq: Once | INTRAVENOUS | Status: AC | PRN
  Administered 2023-06-22: 8 mL via INTRAVENOUS

## 2023-06-23 ENCOUNTER — Ambulatory Visit: Payer: Self-pay | Admitting: Student

## 2023-06-23 DIAGNOSIS — I10 Essential (primary) hypertension: Secondary | ICD-10-CM

## 2023-06-25 ENCOUNTER — Other Ambulatory Visit (HOSPITAL_COMMUNITY): Payer: Self-pay

## 2023-06-25 ENCOUNTER — Encounter: Payer: Self-pay | Admitting: Physician Assistant

## 2023-06-26 NOTE — Telephone Encounter (Signed)
 Let's go ahead and start Hydralazine  25mg  three times daily. She also needs to come in for a repeat BMET (this was already ordered at visit last week). I will likely make for adjustments but I would like to know what her kidney function is first.  Sorry Triage, I tried to send this to [place] but couldn't for some reason. Can you help with this?  Thank you!

## 2023-06-28 ENCOUNTER — Other Ambulatory Visit: Payer: Self-pay | Admitting: *Deleted

## 2023-06-28 DIAGNOSIS — I10 Essential (primary) hypertension: Secondary | ICD-10-CM

## 2023-06-28 LAB — BASIC METABOLIC PANEL WITH GFR
BUN/Creatinine Ratio: 17 (ref 12–28)
BUN: 15 mg/dL (ref 8–27)
CO2: 19 mmol/L — ABNORMAL LOW (ref 20–29)
Calcium: 9.6 mg/dL (ref 8.7–10.3)
Chloride: 95 mmol/L — ABNORMAL LOW (ref 96–106)
Creatinine, Ser: 0.89 mg/dL (ref 0.57–1.00)
Glucose: 83 mg/dL (ref 70–99)
Potassium: 4.6 mmol/L (ref 3.5–5.2)
Sodium: 128 mmol/L — ABNORMAL LOW (ref 134–144)
eGFR: 63 mL/min/{1.73_m2} (ref >59)

## 2023-06-28 MED ORDER — HYDRALAZINE HCL 50 MG PO TABS: 50.0000 mg | ORAL_TABLET | Freq: Three times a day (TID) | ORAL | 3 refills | Status: AC

## 2023-06-28 MED ORDER — VALSARTAN 320 MG PO TABS
320.0000 mg | ORAL_TABLET | Freq: Every day | ORAL | 1 refills | Status: DC
Start: 2023-06-28 — End: 2023-10-30

## 2023-06-30 ENCOUNTER — Other Ambulatory Visit: Payer: Self-pay | Admitting: Nurse Practitioner

## 2023-06-30 DIAGNOSIS — D472 Monoclonal gammopathy: Secondary | ICD-10-CM

## 2023-06-30 NOTE — Progress Notes (Deleted)
 Patient Care Team: Bertha Broad, MD as PCP - General (Internal Medicine) Euell Herrlich, MD as PCP - Cardiology (Cardiology) Jolinda Necessary, MD (Inactive) (Gastroenterology) Arnie Lao, MD (Orthopedic Surgery) Manya Sells, MD (Neurosurgery) Sondra Duran, MD (Ophthalmology) Petrinitz, Susana Enter, DPM (Inactive) (Podiatry) Greta Leatherwood, MD (Obstetrics and Gynecology) Avis Boehringer, MD (Dermatology) Oris Birmingham, MD (Ophthalmology)  Clinic Day:  06/30/2023  Referring physician: Bertha Broad, MD  ASSESSMENT & PLAN:   Assessment & Plan: MGUS (monoclonal gammopathy of unknown significance) IgG Kappa MGUS: --Labs from 10/25/2021 showed SPEP with M protein of 0.8. IFE showed IgG monoclonal protein with kappa light chain specificity. UPEP was unremarkable. Kappa free light chain was elevated at 44 and kappa/lambda ration was elevated at 2.95 in the setting of chronic kidney disease.  --DG bone met survey from 11/01/2021 did not show lytic lesions. Next due around Oct 2024.  --Labs today show white blood cell 5.0, hemoglobin 13.9, MCV 9 5 .7, and platelets of 224. MM panel and serum free light chains are pending.  --RTC in 6 months unless pending labs require further workup.       The patient understands the plans discussed today and is in agreement with them.  She knows to contact our office if she develops concerns prior to her next appointment.  I provided *** minutes of face-to-face time during this encounter and > 50% was spent counseling as documented under my assessment and plan.    Sharyon Deis, NP  [facility] - A DEPT OF [facility] [address] [place] [address] Dept: [phone] Dept Fax: [phone]   No orders of the defined types were placed in this encounter.     CHIEF COMPLAINT:  CC: MGUS  Current Treatment: Surveillance  INTERVAL HISTORY:   [name] is here today for repeat clinical assessment.  She was last seen by me on 12/24/2022.  She denies fevers or chills. She denies pain. Her appetite is good. Her weight {Weight change:10426}.  I have reviewed the past medical history, past surgical history, social history and family history with the patient and they are unchanged from previous note.  ALLERGIES:  is allergic to silicone.  MEDICATIONS:  Current Outpatient Medications  Medication Sig Dispense Refill   aspirin  EC 81 MG tablet Take 1 tablet (81 mg total) by mouth daily. Swallow whole.     brimonidine (ALPHAGAN) 0.2 % ophthalmic solution Place 1 drop into the left eye 3 (three) times daily.     carvedilol  (COREG ) 25 MG tablet Take 25 mg by mouth 2 (two) times daily.     dorzolamidel-timolol  (COSOPT) 22.3-6.8 MG/ML SOLN ophthalmic solution Place 1 drop into both eyes 2 (two) times daily.     furosemide  (LASIX ) 40 MG tablet Take 20 mg by mouth daily as needed for edema.     gabapentin  (NEURONTIN ) 300 MG capsule TAKE ONE CAPSULE AT BEDTIME (Patient not taking: Reported on 03/25/2023) 30 capsule 1   hydrALAZINE  (APRESOLINE ) 50 MG tablet Take 1 tablet (50 mg total) by mouth 3 (three) times daily. 270 tablet 3   latanoprost (XALATAN) 0.005 % ophthalmic solution Place 1 drop into the left eye nightly.     rosuvastatin  (CRESTOR ) 20 MG tablet Take 1 tablet (20 mg total) by mouth daily. 90 tablet 3   rosuvastatin  (CRESTOR ) 20 MG tablet Take 1 tablet (20 mg total) by mouth daily.  selenium  sulfide (SELSUN ) 2.5 % shampoo Apply 1 Application topically 2 (two) times a week. (Patient not taking: Reported on 06/19/2023) 118 mL 0   timolol  (TIMOPTIC ) 0.5 % ophthalmic solution Place 1 drop into both eyes 2 (two) times daily.     valsartan  (DIOVAN ) 320 MG tablet Take 1 tablet (320 mg total) by mouth daily. 90 tablet 1   No current facility-administered medications for this visit.    HISTORY OF PRESENT ILLNESS:   Oncology History   No  history exists.      REVIEW OF SYSTEMS:   Constitutional: Denies fevers, chills or abnormal weight loss Eyes: Denies blurriness of vision Ears, nose, mouth, throat, and face: Denies mucositis or sore throat Respiratory: Denies cough, dyspnea or wheezes Cardiovascular: Denies palpitation, chest discomfort or lower extremity swelling Gastrointestinal:  Denies nausea, heartburn or change in bowel habits Skin: Denies abnormal skin rashes Lymphatics: Denies new lymphadenopathy or easy bruising Neurological:Denies numbness, tingling or new weaknesses Behavioral/Psych: Mood is stable, no new changes  All other systems were reviewed with the patient and are negative.   VITALS:   There were no vitals taken for this visit.  Wt Readings from Last 3 Encounters:  06/19/23 182 lb 8 oz (82.8 kg)  03/25/23 189 lb 6.4 oz (85.9 kg)  12/17/22 191 lb (86.6 kg)    There is no height or weight on file to calculate BMI.  Performance status (ECOG): {CHL ONC [id number]  PHYSICAL EXAM:   GENERAL:alert, no distress and comfortable SKIN: skin color, texture, turgor are normal, no rashes or significant lesions EYES: normal, Conjunctiva are pink and non-injected, sclera clear OROPHARYNX:no exudate, no erythema and lips, buccal mucosa, and tongue normal  NECK: supple, thyroid  normal size, non-tender, without nodularity LYMPH:  no palpable lymphadenopathy in the cervical, axillary or inguinal LUNGS: clear to auscultation and percussion with normal breathing effort HEART: regular rate & rhythm and no murmurs and no lower extremity edema ABDOMEN:abdomen soft, non-tender and normal bowel sounds Musculoskeletal:no cyanosis of digits and no clubbing  NEURO: alert & oriented x 3 with fluent speech, no focal motor/sensory deficits  LABORATORY DATA:  I have reviewed the data as listed    Component Value Date/Time   NA 128 (L) 06/27/2023 1553   K 4.6 06/27/2023 1553   CL 95 (L) 06/27/2023 1553   CO2  19 (L) 06/27/2023 1553   GLUCOSE 83 06/27/2023 1553   GLUCOSE 94 12/24/2022 1316   BUN 15 06/27/2023 1553   CREATININE 0.89 06/27/2023 1553   CREATININE 1.09 (H) 12/24/2022 1316   CALCIUM  9.6 06/27/2023 1553   PROT 7.9 12/24/2022 1316   ALBUMIN 4.5 12/24/2022 1316   AST 18 12/24/2022 1316   ALT 12 12/24/2022 1316   ALKPHOS 107 12/24/2022 1316   BILITOT 0.7 12/24/2022 1316   GFRNONAA 50 (L) 12/24/2022 1316   GFRAA 59 (L) 11/02/2016 1341    No results found for: "SPEP", "UPEP"  Lab Results  Component Value Date   WBC 5.0 12/24/2022   NEUTROABS 2.9 12/24/2022   HGB 13.9 12/24/2022   HCT 39.6 12/24/2022   MCV 95.7 12/24/2022   PLT 224 12/24/2022      Chemistry      Component Value Date/Time   NA 128 (L) 06/27/2023 1553   K 4.6 06/27/2023 1553   CL 95 (L) 06/27/2023 1553   CO2 19 (L) 06/27/2023 1553   BUN 15 06/27/2023 1553   CREATININE 0.89 06/27/2023 1553   CREATININE 1.09 (H) 12/24/2022 1316  GLU 152 03/16/2015 0000      Component Value Date/Time   CALCIUM  9.6 06/27/2023 1553   ALKPHOS 107 12/24/2022 1316   AST 18 12/24/2022 1316   ALT 12 12/24/2022 1316   BILITOT 0.7 12/24/2022 1316       RADIOGRAPHIC STUDIES: I have personally reviewed the radiological images as listed and agreed with the findings in the report. MR Brain W Wo Contrast Result Date: 06/22/2023 CLINICAL DATA:  Transient ischemic attack EXAM: MRI HEAD WITHOUT AND WITH CONTRAST TECHNIQUE: Multiplanar, multiecho pulse sequences of the brain and surrounding structures were obtained without and with intravenous contrast. CONTRAST:  8mL GADAVIST  GADOBUTROL  1 MMOL/ML IV SOLN COMPARISON:  03/17/2020 brain MRI FINDINGS: Brain: No acute infarction, hemorrhage, hydrocephalus, extra-axial collection or mass lesion. Extensive T2 hyperintensity in the cerebral white matter and pons attributed to chronic small vessel ischemia. Moderate atrophy. No abnormal mineralization. Vascular: Absent right vertebral flow  void, chronic when compared to 2022 with intracranial reconstitution. Skull and upper cervical spine: Nodular area of cystic type enhancement the right upper cervical canal measuring 6 mm, favor degenerative ganglion given adjacent advanced C1-2 arthropathy and periarticular enhancement. Suspect this finding is chronic when compared to 2022. Sinuses/Orbits: No acute finding IMPRESSION: No acute finding. Extensive chronic small vessel ischemia. Nodular enhancement in the right spinal canal measuring 6 mm, favor degenerative ganglion from the right C1-2 facet. Chronic slow or absent flow in the right vertebral artery at the skull base with reconstitution towards the basilar. Electronically Signed   By: Ronnette Coke M.D.   On: 06/22/2023 12:22

## 2023-06-30 NOTE — Assessment & Plan Note (Deleted)
 IgG Kappa MGUS: --Labs from 10/25/2021 showed SPEP with M protein of 0.8. IFE showed IgG monoclonal protein with kappa light chain specificity. UPEP was unremarkable. Kappa free light chain was elevated at 44 and kappa/lambda ration was elevated at 2.95 in the setting of chronic kidney disease.  --DG bone met survey from 11/01/2021 did not show lytic lesions. Next due around Oct 2024.  --Labs today show white blood cell 5.0, hemoglobin 13.9, MCV 9 5 .7, and platelets of 224. MM panel and serum free light chains are pending.  --RTC in 6 months unless pending labs require further workup.

## 2023-07-01 ENCOUNTER — Ambulatory Visit: Payer: Medicare Other | Admitting: Nurse Practitioner

## 2023-07-01 ENCOUNTER — Other Ambulatory Visit: Payer: Medicare Other

## 2023-07-01 ENCOUNTER — Telehealth: Payer: Self-pay | Admitting: Physician Assistant

## 2023-07-01 DIAGNOSIS — D472 Monoclonal gammopathy: Secondary | ICD-10-CM

## 2023-07-04 DIAGNOSIS — E871 Hypo-osmolality and hyponatremia: Secondary | ICD-10-CM | POA: Diagnosis not present

## 2023-07-04 DIAGNOSIS — D472 Monoclonal gammopathy: Secondary | ICD-10-CM | POA: Diagnosis present

## 2023-07-04 DIAGNOSIS — Z87891 Personal history of nicotine dependence: Secondary | ICD-10-CM | POA: Insufficient documentation

## 2023-07-04 DIAGNOSIS — C7951 Secondary malignant neoplasm of bone: Secondary | ICD-10-CM | POA: Insufficient documentation

## 2023-07-05 ENCOUNTER — Inpatient Hospital Stay (HOSPITAL_BASED_OUTPATIENT_CLINIC_OR_DEPARTMENT_OTHER): Admitting: Physician Assistant

## 2023-07-05 ENCOUNTER — Other Ambulatory Visit: Payer: Self-pay

## 2023-07-05 ENCOUNTER — Inpatient Hospital Stay: Attending: Physician Assistant

## 2023-07-05 VITALS — BP 156/66 | HR 60 | Temp 97.5°F | Resp 18 | Wt 182.8 lb

## 2023-07-05 DIAGNOSIS — D472 Monoclonal gammopathy: Secondary | ICD-10-CM | POA: Diagnosis not present

## 2023-07-05 LAB — CMP (CANCER CENTER ONLY)
ALT: 16 U/L (ref 0–44)
AST: 21 U/L (ref 15–41)
Albumin: 4.1 g/dL (ref 3.5–5.0)
Alkaline Phosphatase: 107 U/L (ref 38–126)
Anion gap: 6 (ref 5–15)
BUN: 12 mg/dL (ref 8–23)
CO2: 23 mmol/L (ref 22–32)
Calcium: 9.5 mg/dL (ref 8.9–10.3)
Chloride: 98 mmol/L (ref 98–111)
Creatinine: 0.9 mg/dL (ref 0.44–1.00)
GFR, Estimated: >60 mL/min (ref >60)
Glucose, Bld: 91 mg/dL (ref 70–99)
Potassium: 4.1 mmol/L (ref 3.5–5.1)
Sodium: 127 mmol/L — ABNORMAL LOW (ref 135–145)
Total Bilirubin: 0.7 mg/dL (ref 0.0–1.2)
Total Protein: 6.8 g/dL (ref 6.5–8.1)

## 2023-07-05 LAB — CBC WITH DIFFERENTIAL (CANCER CENTER ONLY)
Abs Immature Granulocytes: 0.02 10*3/uL (ref 0.00–0.07)
Basophils Absolute: 0 10*3/uL (ref 0.0–0.1)
Basophils Relative: 1 %
Eosinophils Absolute: 0.3 10*3/uL (ref 0.0–0.5)
Eosinophils Relative: 6 %
HCT: 33 % — ABNORMAL LOW (ref 36.0–46.0)
Hemoglobin: 12.2 g/dL (ref 12.0–15.0)
Immature Granulocytes: 0 %
Lymphocytes Relative: 23 %
Lymphs Abs: 1.1 10*3/uL (ref 0.7–4.0)
MCH: 33.6 pg (ref 26.0–34.0)
MCHC: 37 g/dL — ABNORMAL HIGH (ref 30.0–36.0)
MCV: 90.9 fL (ref 80.0–100.0)
Monocytes Absolute: 0.4 10*3/uL (ref 0.1–1.0)
Monocytes Relative: 9 %
Neutro Abs: 3 10*3/uL (ref 1.7–7.7)
Neutrophils Relative %: 61 %
Platelet Count: 174 10*3/uL (ref 150–400)
RBC: 3.63 MIL/uL — ABNORMAL LOW (ref 3.87–5.11)
RDW: 12.2 % (ref 11.5–15.5)
WBC Count: 4.8 10*3/uL (ref 4.0–10.5)
nRBC: 0 % (ref 0.0–0.2)

## 2023-07-05 NOTE — Telephone Encounter (Signed)
 Called patient due to Chris Pavero having availability for this afternoon for sooner pharmD visit. Patient was at [facility] at time of call so appt was not rs for sooner. Patient reported her BP has come down and someone there will be uploading the 2 wks worth of readings to patient's chart under media today.   Women with pt in lab requested the patient's sodium levels be reviewed due to being low and believing it is due to medications.   Please advise.

## 2023-07-06 NOTE — Progress Notes (Unsigned)
 [facility] Telephone:([phone]) [phone]   [phone]  HEMATOLOGY AND ONCOLOGY PROGRESS NOTE  Patient Care Team: Bertha Broad, MD as PCP - General (Internal Medicine) Euell Herrlich, MD as PCP - Cardiology (Cardiology) Jolinda Necessary, MD (Inactive) (Gastroenterology) Arnie Lao, MD (Orthopedic Surgery) Manya Sells, MD (Neurosurgery) Sondra Duran, MD (Ophthalmology) Petrinitz, Susana Enter, DPM (Inactive) (Podiatry) Greta Leatherwood, MD (Obstetrics and Gynecology) Avis Boehringer, MD (Dermatology) Oris Birmingham, MD (Ophthalmology)  Hematological/Oncological History 1) 06/07/2020: Labs from [facility] - SPEP revealed m-protein 0.2. 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 2) 06/22/2020: Establish care with Shannon Hearing PA-C  CHIEF COMPLAINTS/PURPOSE OF CONSULTATION:  "IgG Kappa MGUS"  HISTORY OF PRESENTING ILLNESS:  [name] 88 y.o. female returns for a routine follow up for MGUS. Patient is unaccompanied for this visit.   On exam today, [name] reports*** she has been stable overall interim since her last visit.  She reports has her chronic back pain from osteoarthritis which has not changed considerably in the interim since her last visit.  She reports that she has not recently seen her kidney doctor and would like to get reconnected with him.  She is on her chart that she had chronic kidney disease and was unaware of this diagnosis.  She has has had no changes in her urine with bubbling, foaming, or change in the color/odor.  She reports that she is not having any other changes in her health.  Patient denies fevers, chills, night sweats, shortness of breath, chest pain or cough.  She has no other complaints.She has no other complaints. Rest of the 10 point ROS is below.    MEDICAL HISTORY:  Past Medical History:  Diagnosis Date   Arthritis    Bilateral edema of lower extremity    CKD  (chronic kidney disease)    Constipation    Degenerative arthritis of hip    s/p THR 05/2011   Depression    Glaucoma    Headache(784.0)    HLD (hyperlipidemia)    Hypertension    OAB (overactive bladder)    Osteoporosis    Primary osteoarthritis of right knee    Slow transit constipation    Unsteady gait    Venous insufficiency     SURGICAL HISTORY: Past Surgical History:  Procedure Laterality Date    cataracts removed  2013 L   ABDOMINAL HYSTERECTOMY  2003   BREAST BIOPSY  1960   BREAST CYSTS     X2 BENIGN   BREAST EXCISIONAL BIOPSY Left    BREAST SURGERY     ENDOVENOUS ABLATION SAPHENOUS VEIN W/ LASER Left 06/04/2019   endovenous laser ablation left greater saphenous vein by Kirtland Perfect MD    GLAUCOMA VALVE INSERTION Bilateral 2013, 2014   bond ([facility])   REDUCTION MAMMAPLASTY Bilateral 2000   TOTAL HIP ARTHROPLASTY  06/01/2011   Procedure: TOTAL HIP ARTHROPLASTY ANTERIOR APPROACH;  Surgeon: Arnie Lao, MD;  Location: [facility];  Service: Orthopedics;  Laterality: Left;  Left Total Hip Replacement, Direct Anterior Approach   TOTAL KNEE ARTHROPLASTY Right 03/14/2015   Procedure: TOTAL KNEE ARTHROPLASTY;  Surgeon: Liliane Rei, MD;  Location: [facility];  Service: Orthopedics;  Laterality: Right;    SOCIAL HISTORY: Social History   Socioeconomic History   Marital status: Divorced    Spouse name: Not on file   Number of children: 1   Years of education: Not on file   Highest education  level: Not on file  Occupational History   Occupation: house wife  Tobacco Use   Smoking status: Former    Current packs/day: 0.00    Average packs/day: 1 pack/day for 35.0 years (35.0 ttl pk-yrs)    Types: Cigarettes    Start date: 05/27/1945    Quit date: 05/27/1980    Years since quitting: 43.1   Smokeless tobacco: Never  Vaping Use   Vaping status: Never Used  Substance and Sexual Activity   Alcohol  use: No    Alcohol /week: 0.0 standard drinks of alcohol    Drug use: No    Sexual activity: Not Currently    Partners: Male    Comment: Divorced  Other Topics Concern   Not on file  Social History Narrative   [place]  One child, [name].  Education: college.   Social Drivers of Corporate investment banker Strain: Not on file  Food Insecurity: Not on file  Transportation Needs: Not on file  Physical Activity: Not on file  Stress: Not on file  Social Connections: Not on file  Intimate Partner Violence: Not on file    FAMILY HISTORY: Family History  Problem Relation Age of Onset   Cancer Mother    Alcohol  abuse Father    Emphysema Father    Stroke Brother    Melanoma Brother    Colon cancer Neg Hx    Esophageal cancer Neg Hx    Stomach cancer Neg Hx     ALLERGIES:  is allergic to clindamycin hcl and silicone.  MEDICATIONS:  Current Outpatient Medications  Medication Sig Dispense Refill   aspirin  EC 81 MG tablet Take 1 tablet (81 mg total) by mouth daily. Swallow whole.     brimonidine (ALPHAGAN) 0.2 % ophthalmic solution Place 1 drop into the left eye 3 (three) times daily.     carvedilol  (COREG ) 25 MG tablet Take 25 mg by mouth 2 (two) times daily.     dorzolamidel-timolol  (COSOPT) 22.3-6.8 MG/ML SOLN ophthalmic solution Place 1 drop into both eyes 2 (two) times daily.     furosemide  (LASIX ) 40 MG tablet Take 20 mg by mouth daily as needed for edema.     hydrALAZINE  (APRESOLINE ) 50 MG tablet Take 1 tablet (50 mg total) by mouth 3 (three) times daily. 270 tablet 3   latanoprost (XALATAN) 0.005 % ophthalmic solution Place 1 drop into the left eye nightly.     rosuvastatin  (CRESTOR ) 20 MG tablet Take 1 tablet (20 mg total) by mouth daily.     timolol  (TIMOPTIC ) 0.5 % ophthalmic solution Place 1 drop into both eyes 2 (two) times daily.     valsartan  (DIOVAN ) 320 MG tablet Take 1 tablet (320 mg total) by mouth daily. (Patient taking differently: Take 320 mg by mouth 2 (two) times daily.) 90 tablet 1   gabapentin  (NEURONTIN )  300 MG capsule TAKE ONE CAPSULE AT BEDTIME (Patient not taking: Reported on 03/25/2023) 30 capsule 1   rosuvastatin  (CRESTOR ) 20 MG tablet Take 1 tablet (20 mg total) by mouth daily. 90 tablet 3   selenium  sulfide (SELSUN ) 2.5 % shampoo Apply 1 Application topically 2 (two) times a week. (Patient not taking: Reported on 06/19/2023) 118 mL 0   No current facility-administered medications for this visit.    REVIEW OF SYSTEMS:   Constitutional: ( - ) fevers, ( - )  chills , ( - ) night sweats Eyes: ( - ) blurriness of vision, ( - ) double vision, ( - )  watery eyes Ears, nose, mouth, throat, and face: ( - ) mucositis, ( - ) sore throat Respiratory: ( - ) cough, ( - ) dyspnea, ( - ) wheezes Cardiovascular: ( - ) palpitation, ( - ) chest discomfort, ( + ) lower extremity swelling Gastrointestinal:  ( - ) nausea, ( - ) heartburn, ( - ) change in bowel habits Skin: ( - ) abnormal skin rashes Lymphatics: ( - ) new lymphadenopathy, ( - ) easy bruising Neurological: ( + ) numbness, ( + ) tingling, ( - ) new weaknesses Behavioral/Psych: ( - ) mood change, ( - ) new changes  All other systems were reviewed with the patient and are negative.  PHYSICAL EXAMINATION: ECOG PERFORMANCE STATUS: 1 - Symptomatic but completely ambulatory  Vitals:   07/05/23 1130  BP: (!) 156/66  Pulse: 60  Resp: 18  Temp: (!) 97.5 F (36.4 C)  SpO2: 98%    Filed Weights   07/05/23 1130  Weight: 182 lb 12.8 oz (82.9 kg)     GENERAL: well appearing female in NAD, obese  SKIN: skin color, texture, turgor are normal, no rashes or significant lesions.  EYES: conjunctiva are pink and non-injected, sclera clear LUNGS: clear to auscultation and percussion with normal breathing effort HEART: regular rate & rhythm and no murmurs. Bilateral lower extremity edema.  Musculoskeletal: no cyanosis of digits and no clubbing  PSYCH: alert & oriented x 3, fluent speech NEURO: no focal motor/sensory deficits  LABORATORY DATA:   I have reviewed the data as listed    Latest Ref Rng & Units 07/05/2023   10:56 AM 12/24/2022    1:16 PM 11/01/2022    3:19 PM  CBC  WBC 4.0 - 10.5 K/uL 4.8  5.0  4.7   Hemoglobin 12.0 - 15.0 g/dL 16.1  09.6  04.5   Hematocrit 36.0 - 46.0 % 33.0  39.6  38.0   Platelets 150 - 400 K/uL 174  224  213.0        Latest Ref Rng & Units 07/05/2023   10:56 AM 06/27/2023    3:53 PM 04/26/2023    3:33 PM  CMP  Glucose 70 - 99 mg/dL 91  83    BUN 8 - 23 mg/dL 12  15    Creatinine 4.09 - 1.00 mg/dL 8.11  9.14  7.82   Sodium 135 - 145 mmol/L 127  128    Potassium 3.5 - 5.1 mmol/L 4.1  4.6    Chloride 98 - 111 mmol/L 98  95    CO2 22 - 32 mmol/L 23  19    Calcium  8.9 - 10.3 mg/dL 9.5  9.6    Total Protein 6.5 - 8.1 g/dL 6.8     Total Bilirubin 0.0 - 1.2 mg/dL 0.7     Alkaline Phos 38 - 126 U/L 107     AST 15 - 41 U/L 21     ALT 0 - 44 U/L 16      ASSESSMENT & PLAN [name] is a 88 y.o. female presenting presenting to the clinic for MGUS.    #IgG Kappa MGUS: --Labs from today were reviewed in detail with patient. WBC 4.8, Hgb 12.2, Plt 174, Creatinine and calcium  levels are normal.SPEP/IFE and serum free light chains are pending --Most recent 24 hour UPEP from 11/01/2021 revealed faint monoclonal bands detected. Due for repeat study now, pending results.  --DG bone met survey from 11/01/2021 did not show lytic lesions. Next due now, scheduled fro  07/12/2023.  --Continue with observation pending above MM studies --RTC in 6 months unless pending labs require further workup.   #Hyponatremia: --Sodium level is 127, previously 128 on 06/27/2023.  --Etiologies include diuretics.  --Advised patient to follow up with cardiology team   Orders Placed This Encounter  Procedures   DG Bone Survey Met    Standing Status:   Future    Expected Date:   07/12/2023    Expiration Date:   07/04/2024    Reason for Exam (SYMPTOM  OR DIAGNOSIS REQUIRED):   evaluate for lytic lesions    Preferred imaging  location?:   [facility]   All questions were answered. The patient knows to call the clinic with any problems, questions or concerns.  I have spent a total of 25 minutes minutes of face-to-face and non-face-to-face time, preparing to see the patient, performing a medically appropriate examination, counseling and educating the patient, ordering tests, documenting clinical information in the electronic health record and care coordination.   Shannon Hearing PA-C Dept of Hematology and Oncology [facility] Phone: [phone]

## 2023-07-08 LAB — MULTIPLE MYELOMA PANEL, SERUM
Albumin SerPl Elph-Mcnc: 3.7 g/dL (ref 2.9–4.4)
Albumin/Glob SerPl: 1.4 (ref 0.7–1.7)
Alpha 1: 0.2 g/dL (ref 0.0–0.4)
Alpha2 Glob SerPl Elph-Mcnc: 0.7 g/dL (ref 0.4–1.0)
B-Globulin SerPl Elph-Mcnc: 0.8 g/dL (ref 0.7–1.3)
Gamma Glob SerPl Elph-Mcnc: 1.1 g/dL (ref 0.4–1.8)
Globulin, Total: 2.7 g/dL (ref 2.2–3.9)
IgA: 71 mg/dL (ref 64–422)
IgG (Immunoglobin G), Serum: 1286 mg/dL (ref 586–1602)
IgM (Immunoglobulin M), Srm: 44 mg/dL (ref 26–217)
M Protein SerPl Elph-Mcnc: 0.7 g/dL — ABNORMAL HIGH
Total Protein ELP: 6.4 g/dL (ref 6.0–8.5)

## 2023-07-08 LAB — KAPPA/LAMBDA LIGHT CHAINS
Kappa free light chain: 29.7 mg/L — ABNORMAL HIGH (ref 3.3–19.4)
Kappa, lambda light chain ratio: 2.41 — ABNORMAL HIGH (ref 0.26–1.65)
Lambda free light chains: 12.3 mg/L (ref 5.7–26.3)

## 2023-07-10 ENCOUNTER — Ambulatory Visit: Attending: Cardiovascular Disease | Admitting: Pharmacist

## 2023-07-10 ENCOUNTER — Encounter: Payer: Self-pay | Admitting: Pharmacist

## 2023-07-10 VITALS — BP 162/73 | HR 63

## 2023-07-10 DIAGNOSIS — I10 Essential (primary) hypertension: Secondary | ICD-10-CM

## 2023-07-10 LAB — UPEP/UIFE/LIGHT CHAINS/TP, 24-HR UR
% BETA, Urine: 36.8 %
ALPHA 1 URINE: 5.1 %
Albumin, U: 26.1 %
Alpha 2, Urine: 8.7 %
Free Kappa Lt Chains,Ur: 1.36 mg/L (ref 1.17–86.46)
Free Kappa/Lambda Ratio: >1.97 (ref 1.83–14.26)
Free Lambda Lt Chains,Ur: <0.69 mg/L (ref 0.27–15.21)
GAMMA GLOBULIN URINE: 23.3 %
Total Protein, Urine-Ur/day: 398 mg/(24.h) — ABNORMAL HIGH (ref 30–150)
Total Protein, Urine: 15 mg/dL
Total Volume: 2650

## 2023-07-10 MED ORDER — AMLODIPINE BESYLATE 5 MG PO TABS: 5.0000 mg | ORAL_TABLET | Freq: Every day | ORAL | 3 refills | Status: DC

## 2023-07-10 NOTE — Progress Notes (Addendum)
 Patient ID: SLOAN GALENTINE                 DOB: 02-08-1935                      MRN: [id number]      HPI: [name] is a 88 y.o. female referred by Dr. Aline Door, PA-C to HTN clinic. PMH is significant for LBBB, chronic lower extremity edema secondary to venous insufficiency, mild mitral regurgitation, PAD with moderately reduced ABIs bilaterally in 08/2022, hypertension, hyperlipidemia, CKD stage III, and MGUS   In last few weeks couple meds changes were done to lower BP. Valsartan  dose was up from 80 to 160 and finally to max dose 160 mg twice daily, hydralazine  25 mg three times daily patient's CMP 5 days ago was WNL except sodium level was 127. CrCl 43 mL/min ( Adj BW) Other than Lasix  she is not on any meds that can lead to low Na level.   The patient presented today to the hypertension clinic reporting bloating and persistent lower leg swelling. She has been taking Lasix  20 mg daily for the past five days with minimal benefit and inquired about increasing the dose to 40 mg daily. Prior to this, she was taking Lasix  20 mg intermittently, as the medication caused frequent bathroom visits, which she found bothersome. She previously had someone come to check her blood pressure at home, for which she was being charged, and after achieving a goal reading, she stopped monitoring her blood pressure regularly. She plans to purchase a home blood pressure cuff today. The patient adheres to a low-salt diet and remains active around the house. She also reports steady weight gain over the past few months and expressed a desire to improve her fitness in preparation for her granddaughter's upcoming wedding.We reviewed correct steps to check BP at home on home BP monitor . Other than Lasix  she is not on any meds that can lead to low Na level   Current HTN meds: Valsartan  320 mg daily and Coreg  25mg  twice daily furosemide  20 mg prn for edema, hydralazine  25 mg three times daily  Previously tried:  BP goal:  <130/80    Social History:  Alcohol : none  Diet: low salt diet   Exercise: stays active around the house    Home BP readings: 192/94,179/89,172/78,129/75   Wt Readings from Last 3 Encounters:  07/18/23 180 lb (81.6 kg)  07/18/23 182 lb 6.4 oz (82.7 kg)  07/05/23 182 lb 12.8 oz (82.9 kg)   BP Readings from Last 3 Encounters:  07/18/23 (!) 138/58  07/18/23 (!) 142/59  07/10/23 (!) 162/73   Pulse Readings from Last 3 Encounters:  07/18/23 64  07/18/23 65  07/10/23 63    Renal function: CrCl cannot be calculated (Patient's most recent lab result is older than the maximum 21 days allowed.).  Past Medical History:  Diagnosis Date   Arthritis    Bilateral edema of lower extremity    CKD (chronic kidney disease)    Constipation    Degenerative arthritis of hip    s/p THR 05/2011   Depression    Glaucoma    Headache(784.0)    HLD (hyperlipidemia)    Hypertension    OAB (overactive bladder)    Osteoporosis    Primary osteoarthritis of right knee    Slow transit constipation    Unsteady gait    Venous insufficiency     Current Outpatient Medications on File  Prior to Visit  Medication Sig Dispense Refill   aspirin  EC 81 MG tablet Take 1 tablet (81 mg total) by mouth daily. Swallow whole.     brimonidine (ALPHAGAN) 0.2 % ophthalmic solution Place 1 drop into the left eye 3 (three) times daily.     carvedilol  (COREG ) 25 MG tablet Take 25 mg by mouth 2 (two) times daily.     dorzolamidel-timolol  (COSOPT) 22.3-6.8 MG/ML SOLN ophthalmic solution Place 1 drop into both eyes 2 (two) times daily.     furosemide  (LASIX ) 40 MG tablet Take 20 mg by mouth daily.     hydrALAZINE  (APRESOLINE ) 50 MG tablet Take 1 tablet (50 mg total) by mouth 3 (three) times daily. 270 tablet 3   latanoprost (XALATAN) 0.005 % ophthalmic solution Place 1 drop into the left eye nightly.     rosuvastatin  (CRESTOR ) 20 MG tablet Take 1 tablet (20 mg total) by mouth daily. 90 tablet 3   timolol   (TIMOPTIC ) 0.5 % ophthalmic solution Place 1 drop into both eyes 2 (two) times daily.     valsartan  (DIOVAN ) 320 MG tablet Take 1 tablet (320 mg total) by mouth daily. 90 tablet 1   No current facility-administered medications on file prior to visit.    Allergies  Allergen Reactions   Clindamycin Hcl Itching   Silicone Rash    Blood pressure (!) 162/73, pulse 63, SpO2 98%.   Assessment/Plan:  1. Hypertension -  Hypertension Assessment: BP is uncontrolled in office BP 162/73 heart rate 63 (goal <130/80) Takes and tolerates current BP well  Denies SOB, palpitation, chest pain, headaches Has been taking Lasix  20 mg everyday for last 5 days with minimum improvement in swelling  and her lab 5 days ago reveled low Na level  Reiterated the importance of regular exercise and low salt diet   Plan:  Given low sodium advised to stopped taking Lasix  and get BMP in 2 days  Continue taking Valsartan  320 mg daily and Coreg  25mg  twice daily  hydralazine  25 mg three times daily  Start taking amlodipine  5 mg daily  Patient to keep record of BP readings with heart rate and report to us  at the next visit Patient to see PharmD in 3 weeks for follow up  Follow up lab(s) BMP on Friday June 13       Thank you  Robbi Blanch, [place].D Redmond Elspeth BIRCH. [facility] [address], [place], [place] [address] Phone: [phone]; Fax: [phone]

## 2023-07-10 NOTE — Assessment & Plan Note (Signed)
 Assessment: BP is uncontrolled in office BP 162/73 heart rate 63 (goal <130/80) Takes and tolerates current BP well  Denies SOB, palpitation, chest pain, headaches Has been taking Lasix  20 mg everyday for last 5 days with minimum improvement in swelling  and her lab 5 days ago reveled low Na level  Reiterated the importance of regular exercise and low salt diet   Plan:  Given low sodium advised to stopped taking Lasix  and get BMP in 2 days  Continue taking Valsartan  320 mg daily and Coreg  25mg  twice daily  hydralazine  25 mg three times daily  Start taking amlodipine 5 mg daily  Patient to keep record of BP readings with heart rate and report to us  at the next visit Patient to see PharmD in 3 weeks for follow up  Follow up lab(s) BMP on Friday June 13

## 2023-07-10 NOTE — Patient Instructions (Addendum)
 Changes made by your pharmacist Nickola Baron, PharmD at today's visit:    Instructions/Changes  (what do you need to do) Your Notes  (what you did and when you did it)  Start taking amlodipine 5 mg daily and continue taking Valsartan  320 mg daily, hydralazine  25 mg three times daily and carvedilol  25 mg twice daily and stop taking furosemide     Check BP daily and bring log at the next office visit     Bring all of your meds, your BP cuff and your record of home blood pressures to your next appointment.    HOW TO TAKE YOUR BLOOD PRESSURE AT HOME  Rest 5 minutes before taking your blood pressure.  Don't smoke or drink caffeinated beverages for at least 30 minutes before. Take your blood pressure before (not after) you eat. Sit comfortably with your back supported and both feet on the floor (don't cross your legs). Elevate your arm to heart level on a table or a desk. Use the proper sized cuff. It should fit smoothly and snugly around your bare upper arm. There should be enough room to slip a fingertip under the cuff. The bottom edge of the cuff should be 1 inch above the crease of the elbow. Ideally, take 3 measurements at one sitting and record the average.  Important lifestyle changes to control high blood pressure  Intervention  Effect on the BP  Lose extra pounds and watch your waistline Weight loss is one of the most effective lifestyle changes for controlling blood pressure. If you're overweight or obese, losing even a small amount of weight can help reduce blood pressure. Blood pressure might go down by about 1 millimeter of mercury (mm Hg) with each kilogram (about 2.2 pounds) of weight lost.  Exercise regularly As a general goal, aim for at least 30 minutes of moderate physical activity every day. Regular physical activity can lower high blood pressure by about 5 to 8 mm Hg.  Eat a healthy diet Eating a diet rich in whole grains, fruits, vegetables, and low-fat dairy products  and low in saturated fat and cholesterol. A healthy diet can lower high blood pressure by up to 11 mm Hg.  Reduce salt (sodium) in your diet Even a small reduction of sodium in the diet can improve heart health and reduce high blood pressure by about 5 to 6 mm Hg.  Limit alcohol  One drink equals 12 ounces of beer, 5 ounces of wine, or 1.5 ounces of 80-proof liquor.  Limiting alcohol  to less than one drink a day for women or two drinks a day for men can help lower blood pressure by about 4 mm Hg.   If you have any questions or concerns please use My Chart to send questions or call the office at [phone]

## 2023-07-12 ENCOUNTER — Other Ambulatory Visit (HOSPITAL_COMMUNITY)

## 2023-07-15 ENCOUNTER — Telehealth: Payer: Self-pay | Admitting: Pharmacist

## 2023-07-15 ENCOUNTER — Ambulatory Visit (HOSPITAL_COMMUNITY)
Admission: RE | Admit: 2023-07-15 | Discharge: 2023-07-15 | Disposition: A | Discharge status: Home / Self Care | Source: Ambulatory Visit | Attending: Physician Assistant | Admitting: Physician Assistant

## 2023-07-15 DIAGNOSIS — D472 Monoclonal gammopathy: Secondary | ICD-10-CM | POA: Insufficient documentation

## 2023-07-15 LAB — BASIC METABOLIC PANEL WITH GFR
Anion gap: 7 (ref 5–15)
BUN: 14 mg/dL (ref 8–23)
CO2: 25 mmol/L (ref 22–32)
Calcium: 9.2 mg/dL (ref 8.9–10.3)
Chloride: 99 mmol/L (ref 98–111)
Creatinine, Ser: 0.96 mg/dL (ref 0.44–1.00)
GFR, Estimated: 58 mL/min — ABNORMAL LOW (ref >60)
Glucose, Bld: 93 mg/dL (ref 70–99)
Potassium: 4.1 mmol/L (ref 3.5–5.1)
Sodium: 131 mmol/L — ABNORMAL LOW (ref 135–145)

## 2023-07-15 NOTE — Progress Notes (Signed)
 Cardiology Office Note:    Date:  07/18/2023   ID:  [name], DOB [birth year], MRN [id number]  PCP:  [name], MD  Cardiologist:  Soyla DELENA Merck, MD     Referring MD: [name], MD   Chief Complaint: follow-up of hypertension and lower extremity edema  History of Present Illness:    [name] is a 88 y.o. female with a history of  LBBB, chronic lower extremity edema secondary to venous insufficiency, mild mitral regurgitation, PAD with moderately reduced ABIs bilaterally in 08/2022, hypertension, hyperlipidemia, CKD stage III, MGUS, and suspect Alzheimer's disease who presents today for follow-up of hypertension and lower extremity edema.   Patient was previously followed by Dr. Dominick and Dr. Claudene. She has a known LBBB. Myoview in 07/2013 was negative for ischemia. She also has a long history of chronic lower extremity edema felt to be secondary to venous varicosity and venous insufficiency (left leg chronically larger than the right).. She has previously been seen by Vascular Surgery for this and an ablation was recommended but never happened. She was not felt to have any active cardiac problems at that tim and was advised to follow back up with Vascular Surgery. She ultimately underwent a endovenous ablation of left greater saphenous vein in 05/2019. Last venous lower extremity ultrasound in 03/2022 showed no evidence of DVT but subcutaneous edema in calf and ankle bilaterally (left > right). ABIs in 08/2022 were indicative of moderate lower extremity arterial disease bilaterally (0.51 on the right and 0.69 on the left). He was seen by Vascular Surgery following this who recommended conservative/ medical therapy for treatment of PAD.    At visit with me in 11/2022, she reported worsening lower extremity and new shortness of breath. BNP was elevated at 235. She was restarted on Lasix  40mg  daily. Echo was also ordered. However, this was not completed until 01/2023 and  showed LVEF of 60-65% with moderate asymmetric LVH of the basal-septal segment and grade 1 diastolic dysfunction, normal RV function, and mild MR.   She has been seen by our office a couple of type over the last few months for uncontrolled hypertension. She has been reluctant to take Lasix  due to increased urinary frequency and multiple medication changes have been made to antihypertensive regimen.   She was last seen by me on 06/19/2023  at which time she reported persistent but stable dyspnea. She felt like her chronic edema had improved some. BP was still markedly elevated. Additional BP changes were made. She also described an episode of difficult word finding and speaking that sound like a TIA. Brain MRI was ordered and was negative for TIA/ stroke. She was referred to Neurology.   She was subsequently seen in our PharmD Hypertension Clinic on 07/10/2023 at which time BP had improved some but was still elevated. She was started on Amlodipine  5mg  daily and continued on Valsartan  320mg  daily, Hydralazine  25mg  three times daily, and Coreg  25mg  twice daily. She had increased her Lasix  to daily use for the last 5 days. Her sodium level had dropped with this but she had not noticed a significant improvement in her swelling so Lasix  was stopped.   Patient presents today for follow-up.  Her BP is much better today at 138/58 and she is tolerating the amlodipine  well.  She has had no worsening of her lower extremity edema with this.  She states she has had a lot of issues with her arthritis in her back over  the last 2 weeks and is in quite a bit of pain because of this today.  However, she seems to be stable from a cardiac standpoint.  She denies any chest pain.  She states her breathing is alright.  She denies any new or worsening shortness of breath.  No significant orthopnea.  No PND.  She has chronic lower extremity edema which is stable.  This has been felt to be due to her chronic venous insufficiency not  CHF. Weight is down 2 lbs from last visit.  She denies any palpitations, lightheadedness, dizziness, syncope.  She does report a mechanical fall couple of weeks ago where she was trying to pick up something in her closet and fell over.  She states she hit her head on the wall during this fall but denies any loss of consciousness and no new neurologic symptoms.  She was seen by Neurology earlier today and was felt to likely have Alzheimer's disease and vascular etiology with late onset.  He was started on Memantine  and EEG was ordered.  Neuropsych testing was also ordered for diagnostic clarity.  EKGs/Labs/Other Studies Reviewed:    The following studies were reviewed:  Myoview 08/28/2013: Normal. No evidence of ischemia. EF 60%.  _______________   Lower Extremity Venous Dopplers 04/10/2022: Summary:  BILATERAL:  - No evidence of deep vein thrombosis seen in the lower extremities,  bilaterally.  -No evidence of popliteal cyst, bilaterally.  -Subcutaneous edema seen in calf and ankle, bilaterally. L > R  _______________   ABIs/ TBIs 09/10/2022: Summary:  Right: Resting right ankle-brachial index indicates moderate right lower  extremity arterial disease. The right toe-brachial index is abnormal. PPG  tracings appear dampened.   Left: Resting left ankle-brachial index indicates moderate left lower  extremity arterial disease. The left toe-brachial index is abnormal. _______________   Echocardiogram 02/25/2023: Impressions: 1. Left ventricular ejection fraction, by estimation, is 60 to 65%. The  left ventricle has normal function. The left ventricle has no regional  wall motion abnormalities. There is moderate asymmetric left ventricular  hypertrophy of the basal-septal  segment. No LV outflow tract gradient or mitral valve systolic anterior  motion. Left ventricular diastolic parameters are consistent with Grade I  diastolic dysfunction (impaired relaxation). The average left  ventricular  global longitudinal strain is  -23.9 %. The global longitudinal strain is normal.   2. Right ventricular systolic function is normal. The right ventricular  size is normal. There is moderately elevated pulmonary artery systolic  pressure. The estimated right ventricular systolic pressure is 47.6 mmHg.   3. The mitral valve is normal in structure. Mild mitral valve  regurgitation. No evidence of mitral stenosis.   4. The aortic valve is tricuspid. Aortic valve regurgitation is mild. No  aortic stenosis is present.   5. The inferior vena cava is normal in size with greater than 50%  respiratory variability, suggesting right atrial pressure of 3 mmHg.   EKG:  EKG not ordered today.   Recent Labs: 11/01/2022: TSH 1.310 12/17/2022: BNP 235.8 07/05/2023: ALT 16; Hemoglobin 12.2; Platelet Count 174 07/15/2023: BUN 14; Creatinine, Ser 0.96; Potassium 4.1; Sodium 131  Recent Lipid Panel    Component Value Date/Time   CHOL 163 07/10/2013 1229   TRIG 87.0 07/10/2013 1229   HDL 56.70 07/10/2013 1229   CHOLHDL 3 07/10/2013 1229   VLDL 17.4 07/10/2013 1229   LDLCALC 89 07/10/2013 1229    Physical Exam:    Vital Signs: BP (!) 138/58   Pulse 64  Ht 5' 1 (1.549 m)   Wt 180 lb (81.6 kg)   SpO2 92%   BMI 34.01 kg/m     Wt Readings from Last 3 Encounters:  07/18/23 180 lb (81.6 kg)  07/18/23 182 lb 6.4 oz (82.7 kg)  07/05/23 182 lb 12.8 oz (82.9 kg)     General: 88 y.o. obese Caucasian female in no acute distress. HEENT: Normocephalic and atraumatic. Sclera clear.  Neck: Supple. No JVD. Heart: RRR. Distinct S1 and S2. No murmurs, gallops, or rubs.  Lungs: No increased work of breathing. Clear to ausculation bilaterally. No wheezes, rhonchi, or rales.   Extremities: 1-2+ pitting edema of bilateral lower extremities.  Skin: Warm and dry. Neuro: No focal deficits. Psych: Normal affect. Responds appropriately.  Assessment:    1. Primary hypertension   2. Bilateral lower  extremity edema   3. Chronic venous insufficiency   4. Chronic dyspnea   5. Mild mitral regurgitation   6. PAD (peripheral artery disease) (HCC)   7. Hyperlipidemia, unspecified hyperlipidemia type   8. Stage 3 chronic kidney disease, unspecified whether stage 3a or 3b CKD (HCC)   9. Hyponatremia     Plan:    Hypertension BP has significant improved with the addition of Amlodipine . BP 138/58 in the office today. - Continue current medications: Amlodipine  5mg  daily, Valsartan  320mg  daily, Coreg  25mg  twice daily, and Hydralazine  50mg  three times daily. - OK with current BP given advanced age. - She has follow-up in the PharmD HTN clinic in early 07/2023.   Chronic Lower Extremity Edema Chronic Venous Insufficiency  Patient has a a long history of chronic lower extremity edema due to varicose veins and venous insufficiency. S/p endovenous ablation of left greater saphenous vein in 05/2019. Last lower extremity venous doppler in 03/2022 showed no evidence of DVT but subcutaneous edema in calf and ankle bilaterally (left > right). Last Echo in 01/2023 showed LVEF of 60-65% with moderate asymmetric LVH of the basal-septal segment and grade 1 diastolic dysfunction, normal RV function, and mild MR.  - Stable. - She was previously told to stop Lasix  given worsening hyponatremia. However, she states she is still taking Lasix  20mg  daily. Most recent BMET earlier this week showed sodium level had improved from 127 to 131. Given improvement in sodium, okay to continue current dose of Lasix . - She asked about increasing the dose of Lasix  to help with her edema as this is quite frustrating for her. However, I explain that I think her edema is primarily due to chronic venous insufficiency and diuretics are not the best way to treat this. I also don't want to increase her Lasix  given her hyponatremia issues. Recommended compression stockings and limiting salt intake.  - She has been seen by Vascular Surgery in  the past but there was not felt to be any role for intervention without any limb-threatening symptoms. Recommended following back up with them if edema worsens.    Chronic Dyspnea  Patient has a history of dyspnea on exertion. Echo in 01/2023 showed 60-65% with moderate asymmetric LVH of the basal-septal segment and grade 1 diastolic dysfunction, normal RV function, and mild MR.  - Stable. - She may have some degree of mild diastolic CHF. However, no signs of decompensated CHF. Edema felt to be due to chronic venous insufficiency not CHF.   Mild Mitral Regurgitation Noted on Echo in 01/2023.  - Can continue to monitor with routine serial imaging.    PAD ABIs in 08/2022 were indicative of moderate  lower extremity arterial disease bilaterally (0.51 on the right and 0.69 on the left).  - Continue Aspirin  81mg  daily and Crestor  20mg  daily.  - Follow-up with Vascular Surgery as  needed.     Hyperlipidemia Lipid panel in 03/2023 (per [facility]): Total Cholesterol 112, Triglycerides 58, HDL 62, LDL 38. LDL goal <70 given PAD.  - Continue Crestor  20mg  daily.   CKD Stage III Baseline creatinine around 0.9 to 1.0.  Hyponatremia Sodium recently dropped as low as 127 on 07/05/2023. However, improved to 131 on repeat labs earlier this week on 07/15/2023.  - We had previously instructed patient to stop Lasix  when sodium dropped to 127 but she is adamant that she is still taking 20mg  daily. Given improvement in sodium on last recheck, I think it is okay to continue current dose but would not increase this.  Disposition: Keep follow-up visit with PharmD on 07/31/2023. Follow-up with General Cardiology in 6 months.   Signed, Minela Bridge[place] E Edmond Ginsberg, PA-C  07/18/2023 1:19 PM     [facility]

## 2023-07-15 NOTE — Telephone Encounter (Signed)
 Called to remind patient to get Basic Metabolic Panel (BMP) drawn.Patient reports Amlodipine  is helping to lower blood pressure to the 129-130 mmHg range.She is also taking Furosemide  20 mg daily.Patient has an appointment at [facility] for a diagnostic test and plans to stop at the lab today for the BMP.

## 2023-07-16 ENCOUNTER — Ambulatory Visit: Payer: Self-pay | Admitting: Student

## 2023-07-18 ENCOUNTER — Other Ambulatory Visit

## 2023-07-18 ENCOUNTER — Ambulatory Visit (INDEPENDENT_AMBULATORY_CARE_PROVIDER_SITE_OTHER): Admitting: Physician Assistant

## 2023-07-18 ENCOUNTER — Encounter: Payer: Self-pay | Admitting: Physician Assistant

## 2023-07-18 ENCOUNTER — Ambulatory Visit: Admitting: Physician Assistant

## 2023-07-18 ENCOUNTER — Ambulatory Visit: Attending: Student | Admitting: Student

## 2023-07-18 ENCOUNTER — Encounter: Payer: Self-pay | Admitting: Student

## 2023-07-18 VITALS — BP 138/58 | HR 64 | Ht 61.0 in | Wt 180.0 lb

## 2023-07-18 VITALS — BP 142/59 | HR 65 | Ht 61.0 in | Wt 182.4 lb

## 2023-07-18 DIAGNOSIS — R600 Localized edema: Secondary | ICD-10-CM | POA: Diagnosis not present

## 2023-07-18 DIAGNOSIS — I872 Venous insufficiency (chronic) (peripheral): Secondary | ICD-10-CM

## 2023-07-18 DIAGNOSIS — R404 Transient alteration of awareness: Secondary | ICD-10-CM

## 2023-07-18 DIAGNOSIS — R0609 Other forms of dyspnea: Secondary | ICD-10-CM

## 2023-07-18 DIAGNOSIS — I340 Nonrheumatic mitral (valve) insufficiency: Secondary | ICD-10-CM

## 2023-07-18 DIAGNOSIS — N183 Chronic kidney disease, stage 3 unspecified: Secondary | ICD-10-CM

## 2023-07-18 DIAGNOSIS — E871 Hypo-osmolality and hyponatremia: Secondary | ICD-10-CM

## 2023-07-18 DIAGNOSIS — I739 Peripheral vascular disease, unspecified: Secondary | ICD-10-CM

## 2023-07-18 DIAGNOSIS — I10 Essential (primary) hypertension: Secondary | ICD-10-CM | POA: Diagnosis not present

## 2023-07-18 DIAGNOSIS — E785 Hyperlipidemia, unspecified: Secondary | ICD-10-CM

## 2023-07-18 DIAGNOSIS — R413 Other amnesia: Secondary | ICD-10-CM | POA: Diagnosis not present

## 2023-07-18 MED ORDER — MEMANTINE HCL 10 MG PO TABS: ORAL_TABLET | ORAL | 3 refills | Status: DC

## 2023-07-18 NOTE — Patient Instructions (Addendum)
 It was a pleasure to see you today at our office.   Recommendations:  Neurocognitive evaluation at our office  Check labs today    Start Memantine 10 mg: Take 1 tablet (10 mg at night) for 2 weeks, then increase to 1 tablet (10 mg) twice a day.    Follow up in 6  months  Recommend visiting the website :  Dementia Success Path to better understand some behaviors related to memory loss.  For psychiatric meds, mood meds: Please have your primary care physician manage these medications.  If you have any severe symptoms of a stroke, or other severe issues such as confusion,severe chills or fever, etc call 911 or go to the ER as you may need to be evaluated further   Counseling regarding caregiver distress, including caregiver depression, anxiety and issues regarding community resources, adult day care programs, adult living facilities, or memory care questions:  please contact your  Primary Doctor's Social Worker   FOR Memory  decline, memory medications: Call our office [phone]    [email or web address]   RECOMMENDATIONS FOR ALL PATIENTS WITH MEMORY PROBLEMS: 1. Continue to exercise (Recommend 30 minutes of walking everyday, or 3 hours every week) 2. Increase social interactions - continue going to [place] and enjoy social gatherings with friends and family 3. Eat healthy, avoid fried foods and eat more fruits and vegetables 4. Maintain adequate blood pressure, blood sugar, and blood cholesterol level. Reducing the risk of stroke and cardiovascular disease also helps promoting better memory. 5. Avoid stressful situations. Live a simple life and avoid aggravations. Organize your time and prepare for the next day in anticipation. 6. Sleep well, avoid any interruptions of sleep and avoid any distractions in the bedroom that may interfere with adequate sleep quality 7. Avoid sugar, avoid sweets as there is a strong link between excessive  sugar intake, diabetes, and cognitive impairment We discussed the Mediterranean diet, which has been shown to help patients reduce the risk of progressive memory disorders and reduces cardiovascular risk. This includes eating fish, eat fruits and green leafy vegetables, nuts like almonds and hazelnuts, walnuts, and also use olive oil. Avoid fast foods and fried foods as much as possible. Avoid sweets and sugar as sugar use has been linked to worsening of memory function.  There is always a concern of gradual progression of memory problems. If this is the case, then we may need to adjust level of care according to patient needs. Support, both to the patient and caregiver, should then be put into place.      You have been referred for a neuropsychological evaluation (i.e., evaluation of memory and thinking abilities). Please bring someone with you to this appointment if possible, as it is helpful for the doctor to hear from both you and another adult who knows you well. Please bring eyeglasses and hearing aids if you wear them.    The evaluation will take approximately 3 hours and has two parts:   The first part is a clinical interview with the neuropsychologist (Dr. Kitty Perkins or Dr. Donavon Fudge). During the interview, the neuropsychologist will speak with you and the individual you brought to the appointment.    The second part of the evaluation is testing with the doctor's technician Bernabe Brew or Burdette Carolin). During the testing, the technician will ask you to remember different types of material, solve problems, and answer some questionnaires. Your family member will not be present for this portion of the evaluation.   Please note: We must reserve several  hours of the neuropsychologist's time and the psychometrician's time for your evaluation appointment. As such, there is a No-Show fee of $100. If you are unable to attend any of your appointments, please contact our office as soon as possible to reschedule.       DRIVING: Regarding driving, in patients with progressive memory problems, driving will be impaired. We advise to have someone else do the driving if trouble finding directions or if minor accidents are reported. Independent driving assessment is available to determine safety of driving.   If you are interested in the driving assessment, you can contact the following:  [facility] [employer] [facility] in [place] [phone]  [facility] [phone]  [facility] [phone]  [facility] [phone] or [phone]   FALL PRECAUTIONS: Be cautious when walking. Scan the area for obstacles that may increase the risk of trips and falls. When getting up in the mornings, sit up at the edge of the bed for a few minutes before getting out of bed. Consider elevating the bed at the head end to avoid drop of blood pressure when getting up. Walk always in a well-lit room (use night lights in the walls). Avoid area rugs or power cords from appliances in the middle of the walkways. Use a walker or a cane if necessary and consider physical therapy for balance exercise. Get your eyesight checked regularly.  FINANCIAL OVERSIGHT: Supervision, especially oversight when making financial decisions or transactions is also recommended.  HOME SAFETY: Consider the safety of the kitchen when operating appliances like stoves, microwave oven, and blender. Consider having supervision and share cooking responsibilities until no longer able to participate in those. Accidents with firearms and other hazards in the house should be identified and addressed as well.   ABILITY TO BE LEFT ALONE: If patient is unable to contact 911 operator, consider using LifeLine, or when the need is there, arrange for someone to stay with patients. Smoking is a fire hazard, consider supervision or cessation. Risk of wandering should be assessed by caregiver and if detected at any point, supervision and  safe proof recommendations should be instituted.  MEDICATION SUPERVISION: Inability to self-administer medication needs to be constantly addressed. Implement a mechanism to ensure safe administration of the medications.      Mediterranean Diet A Mediterranean diet refers to food and lifestyle choices that are based on the traditions of countries located on the [employer] [place]. This way of eating has been shown to help prevent certain conditions and improve outcomes for people who have chronic diseases, like kidney disease and heart disease. What are tips for following this plan? Lifestyle  Cook and eat meals together with your family, when possible. Drink enough fluid to keep your urine clear or pale yellow. Be physically active every day. This includes: Aerobic exercise like running or swimming. Leisure activities like gardening, walking, or housework. Get 7-8 hours of sleep each night. If recommended by your health care provider, drink red wine in moderation. This means 1 glass a day for nonpregnant women and 2 glasses a day for men. A glass of wine equals 5 oz (150 mL). Reading food labels  Check the serving size of packaged foods. For foods such as rice and pasta, the serving size refers to the amount of cooked product, not dry. Check the total fat in packaged foods. Avoid foods that have saturated fat or trans fats. Check the ingredients list for added sugars, such as corn syrup. Shopping  At the grocery store, buy  most of your food from the areas near the walls of the store. This includes: Fresh fruits and vegetables (produce). Grains, beans, nuts, and seeds. Some of these may be available in unpackaged forms or large amounts (in bulk). Fresh seafood. Poultry and eggs. Low-fat dairy products. Buy whole ingredients instead of prepackaged foods. Buy fresh fruits and vegetables in-season from local farmers markets. Buy frozen fruits and vegetables in resealable bags. If you do  not have access to quality fresh seafood, buy precooked frozen shrimp or canned fish, such as tuna, salmon, or sardines. Buy small amounts of raw or cooked vegetables, salads, or olives from the deli or salad bar at your store. Stock your pantry so you always have certain foods on hand, such as olive oil, canned tuna, canned tomatoes, rice, pasta, and beans. Cooking  Cook foods with extra-virgin olive oil instead of using butter or other vegetable oils. Have meat as a side dish, and have vegetables or grains as your main dish. This means having meat in small portions or adding small amounts of meat to foods like pasta or stew. Use beans or vegetables instead of meat in common dishes like chili or lasagna. Experiment with different cooking methods. Try roasting or broiling vegetables instead of steaming or sauteing them. Add frozen vegetables to soups, stews, pasta, or rice. Add nuts or seeds for added healthy fat at each meal. You can add these to yogurt, salads, or vegetable dishes. Marinate fish or vegetables using olive oil, lemon juice, garlic, and fresh herbs. Meal planning  Plan to eat 1 vegetarian meal one day each week. Try to work up to 2 vegetarian meals, if possible. Eat seafood 2 or more times a week. Have healthy snacks readily available, such as: Vegetable sticks with hummus. Greek yogurt. Fruit and nut trail mix. Eat balanced meals throughout the week. This includes: Fruit: 2-3 servings a day Vegetables: 4-5 servings a day Low-fat dairy: 2 servings a day Fish, poultry, or lean meat: 1 serving a day Beans and legumes: 2 or more servings a week Nuts and seeds: 1-2 servings a day Whole grains: 6-8 servings a day Extra-virgin olive oil: 3-4 servings a day Limit red meat and sweets to only a few servings a month What are my food choices? Mediterranean diet Recommended Grains: Whole-grain pasta. Brown rice. Bulgar wheat. Polenta. Couscous. Whole-wheat bread. Dwyane Glad. Vegetables: Artichokes. Beets. Broccoli. Cabbage. Carrots. Eggplant. Green beans. Chard. Kale. Spinach. Onions. Leeks. Peas. Squash. Tomatoes. Peppers. Radishes. Fruits: Apples. Apricots. Avocado. Berries. Bananas. Cherries. Dates. Figs. Grapes. Lemons. Melon. Oranges. Peaches. Plums. Pomegranate. Meats and other protein foods: Beans. Almonds. Sunflower seeds. Pine nuts. Peanuts. Cod. Salmon. Scallops. Shrimp. Tuna. Tilapia. Clams. Oysters. Eggs. Dairy: Low-fat milk. Cheese. Greek yogurt. Beverages: Water. Red wine. Herbal tea. Fats and oils: Extra virgin olive oil. Avocado oil. Grape seed oil. Sweets and desserts: [place] yogurt with honey. Baked apples. Poached pears. Trail mix. Seasoning and other foods: Basil. Cilantro. Coriander. Cumin. Mint. Parsley. Sage. Rosemary. Tarragon. Garlic. Oregano. Thyme. Pepper. Balsalmic vinegar. Tahini. Hummus. Tomato sauce. Olives. Mushrooms. Limit these Grains: Prepackaged pasta or rice dishes. Prepackaged cereal with added sugar. Vegetables: Deep fried potatoes (french fries). Fruits: Fruit canned in syrup. Meats and other protein foods: Beef. Pork. Lamb. Poultry with skin. Hot dogs. Helene Loader. Dairy: Ice cream. Sour cream. Whole milk. Beverages: Juice. Sugar-sweetened soft drinks. Beer. Liquor and spirits. Fats and oils: Butter. Canola oil. Vegetable oil. Beef fat (tallow). Lard. Sweets and desserts: Cookies. Cakes. Pies. Candy. Seasoning and  other foods: Mayonnaise. Premade sauces and marinades. The items listed may not be a complete list. Talk with your dietitian about what dietary choices are right for you. Summary The Mediterranean diet includes both food and lifestyle choices. Eat a variety of fresh fruits and vegetables, beans, nuts, seeds, and whole grains. Limit the amount of red meat and sweets that you eat. Talk with your health care provider about whether it is safe for you to drink red wine in moderation. This means 1 glass a day for  nonpregnant women and 2 glasses a day for men. A glass of wine equals 5 oz (150 mL). This information is not intended to replace advice given to you by your health care provider. Make sure you discuss any questions you have with your health care provider. Document Released: 09/08/2015 Document Revised: 10/11/2015 Document Reviewed: 09/08/2015 Elsevier Interactive Patient Education  2017 [employer].

## 2023-07-18 NOTE — Patient Instructions (Signed)
 Medication Instructions:  Your physician recommends that you continue on your current medications as directed. Please refer to the Current Medication list given to you today.  *If you need a refill on your cardiac medications before your next appointment, please call your pharmacy*  Lab Work: None ordered  If you have labs (blood work) drawn today and your tests are completely normal, you will receive your results only by: MyChart Message (if you have MyChart) OR A paper copy in the mail If you have any lab test that is abnormal or we need to change your treatment, we will call you to review the results.  Testing/Procedures: None ordered  Follow-Up: At [facility], you and your health needs are our priority.  As part of our continuing mission to provide you with exceptional heart care, our providers are all part of one team.  This team includes your primary Cardiologist (physician) and Advanced Practice Providers or APPs (Physician Assistants and Nurse Practitioners) who all work together to provide you with the care you need, when you need it.  Your next appointment:   6 month(s)  Provider:   Gayatri A Acharya, MD or Callie Goodrich, PA-C          We recommend signing up for the patient portal called MyChart.  Sign up information is provided on this After Visit Summary.  MyChart is used to connect with patients for Virtual Visits (Telemedicine).  Patients are able to view lab/test results, encounter notes, upcoming appointments, etc.  Non-urgent messages can be sent to your provider as well.   To learn more about what you can do with MyChart, go to [email or web address].   Other Instructions
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
## 2023-07-19 ENCOUNTER — Ambulatory Visit: Payer: Self-pay | Admitting: Physician Assistant

## 2023-07-19 LAB — TSH: TSH: 1.09 m[IU]/L (ref 0.40–4.50)

## 2023-07-19 LAB — VITAMIN B12: Vitamin B-12: 587 pg/mL (ref 200–1100)

## 2023-07-24 ENCOUNTER — Ambulatory Visit: Payer: Self-pay | Admitting: Physician Assistant

## 2023-07-31 ENCOUNTER — Ambulatory Visit: Admitting: Pharmacist

## 2023-07-31 ENCOUNTER — Other Ambulatory Visit

## 2023-07-31 ENCOUNTER — Telehealth: Payer: Self-pay | Admitting: Physician Assistant

## 2023-07-31 NOTE — Progress Notes (Deleted)
 Patient ID: [name]                 DOB: 01-28-1936                      MRN: [id number]      HPI: LING FLESCH is a 88 y.o. female referred by Dr. Aline Door, PA-C to HTN clinic. PMH is significant for LBBB, chronic lower extremity edema secondary to venous insufficiency, mild mitral regurgitation, PAD with moderately reduced ABIs bilaterally in 08/2022, hypertension, hyperlipidemia, CKD stage III, and MGUS   In last few weeks couple meds changes were done to lower BP. Valsartan  dose was up from 80 to 160 and finally to max dose 160 mg twice daily, hydralazine  25 mg three times daily patient's CMP 5 days ago was WNL except sodium level was 127. CrCl 43 mL/min ( Adj BW) Other than Lasix  she is not on any meds that can lead to low Na level.   The patient presented today to the hypertension clinic reporting bloating and persistent lower leg swelling. She has been taking Lasix  20 mg daily for the past five days with minimal benefit and inquired about increasing the dose to 40 mg daily. Prior to this, she was taking Lasix  20 mg intermittently, as the medication caused frequent bathroom visits, which she found bothersome. She previously had someone come to check her blood pressure at home, for which she was being charged, and after achieving a goal reading, she stopped monitoring her blood pressure regularly. She plans to purchase a home blood pressure cuff today. The patient adheres to a low-salt diet and remains active around the house. She also reports steady weight gain over the past few months and expressed a desire to improve her fitness in preparation for her granddaughter's upcoming wedding.We reviewed correct steps to check BP at home on home BP monitor . Other than Lasix  she is not on any meds that can lead to low Na level   Current HTN meds: Valsartan  320 mg daily and Coreg  25mg  twice daily furosemide  20 mg prn for edema, hydralazine  25 mg three times daily  Previously tried:  BP goal:  <130/80    Social History:  Alcohol : none  Diet: low salt diet   Exercise: stays active around the house    Home BP readings: 192/94,179/89,172/78,129/75   Wt Readings from Last 3 Encounters:  07/18/23 180 lb (81.6 kg)  07/18/23 182 lb 6.4 oz (82.7 kg)  07/05/23 182 lb 12.8 oz (82.9 kg)   BP Readings from Last 3 Encounters:  07/18/23 (!) 138/58  07/18/23 (!) 142/59  07/10/23 (!) 162/73   Pulse Readings from Last 3 Encounters:  07/18/23 64  07/18/23 65  07/10/23 63    Renal function: Estimated Creatinine Clearance: 40.7 mL/min (by C-G formula based on SCr of 0.96 mg/dL).  Past Medical History:  Diagnosis Date   Arthritis    Bilateral edema of lower extremity    CKD (chronic kidney disease)    Constipation    Degenerative arthritis of hip    s/p THR 05/2011   Depression    Glaucoma    Headache(784.0)    HLD (hyperlipidemia)    Hypertension    OAB (overactive bladder)    Osteoporosis    Primary osteoarthritis of right knee    Slow transit constipation    Unsteady gait    Venous insufficiency     Current Outpatient Medications on File Prior to Visit  Medication Sig Dispense Refill   amLODipine  (NORVASC ) 5 MG tablet Take 1 tablet (5 mg total) by mouth daily. 180 tablet 3   aspirin  EC 81 MG tablet Take 1 tablet (81 mg total) by mouth daily. Swallow whole.     brimonidine (ALPHAGAN) 0.2 % ophthalmic solution Place 1 drop into the left eye 3 (three) times daily.     carvedilol  (COREG ) 25 MG tablet Take 25 mg by mouth 2 (two) times daily.     dorzolamidel-timolol  (COSOPT) 22.3-6.8 MG/ML SOLN ophthalmic solution Place 1 drop into both eyes 2 (two) times daily.     furosemide  (LASIX ) 40 MG tablet Take 20 mg by mouth daily.     hydrALAZINE  (APRESOLINE ) 50 MG tablet Take 1 tablet (50 mg total) by mouth 3 (three) times daily. 270 tablet 3   latanoprost (XALATAN) 0.005 % ophthalmic solution Place 1 drop into the left eye nightly.     memantine  (NAMENDA ) 10 MG tablet  Take 1 tablet (10 mg at night) for 2 weeks, then increase to 1 tablet (10 mg) twice a day 180 tablet 3   rosuvastatin  (CRESTOR ) 20 MG tablet Take 1 tablet (20 mg total) by mouth daily. 90 tablet 3   timolol  (TIMOPTIC ) 0.5 % ophthalmic solution Place 1 drop into both eyes 2 (two) times daily.     valsartan  (DIOVAN ) 320 MG tablet Take 1 tablet (320 mg total) by mouth daily. 90 tablet 1   No current facility-administered medications on file prior to visit.    Allergies  Allergen Reactions   Clindamycin Hcl Itching   Silicone Rash    There were no vitals taken for this visit.   Assessment/Plan:  1. Hypertension -  No problem-specific Assessment & Plan notes found for this encounter.      Thank you  Robbi Blanch, Pharm.D [place] [facility]sp[facility] BIRCH. [facility] [address], [place], [place] [address] Phone: [phone]; Fax: [phone]

## 2023-07-31 NOTE — Telephone Encounter (Signed)
 Pt wants to speak to someone about why she is getting all of these things on [facility] about Dementia. She does not understand why she is getting these things now. She states that she has never felt like she has dementia or never been told that she has had it before

## 2023-08-01 NOTE — Telephone Encounter (Signed)
 Pt called and Lm on my VM, she is returning a call to christy. She called her this AM and left her a message

## 2023-08-01 NOTE — Telephone Encounter (Signed)
 I advised to patient of [name]'s notes, she will look into her My chart. Very thankful for my call to her back.

## 2023-08-01 NOTE — Telephone Encounter (Signed)
 So very nice and thanked me for calling back again regarding her testing

## 2023-08-05 ENCOUNTER — Other Ambulatory Visit

## 2023-08-07 ENCOUNTER — Ambulatory Visit: Admitting: Pharmacist

## 2023-08-21 ENCOUNTER — Ambulatory Visit: Admitting: Pharmacist

## 2023-09-17 NOTE — Progress Notes (Deleted)
 Patient ID: [name]                 DOB: Jun 27, 1935                      MRN: [id number]      HPI: [name] is a 88 y.o. female referred by Dr. Aline Door, PA-C to HTN clinic. PMH is significant for LBBB, chronic lower extremity edema secondary to venous insufficiency, mild mitral regurgitation, PAD with moderately reduced ABIs bilaterally in 08/2022, hypertension, hyperlipidemia, CKD stage III, and MGUS   In last few weeks couple meds changes were done to lower BP. Valsartan  dose was up from 80 to 160 and finally to max dose 160 mg twice daily, hydralazine  25 mg three times daily patient's CMP 5 days ago was WNL except sodium level was 127. CrCl 43 mL/min ( Adj BW) Other than Lasix  she is not on any meds that can lead to low Na level.   The patient presented today to the hypertension clinic reporting bloating and persistent lower leg swelling. She has been taking Lasix  20 mg daily for the past five days with minimal benefit and inquired about increasing the dose to 40 mg daily. Prior to this, she was taking Lasix  20 mg intermittently, as the medication caused frequent bathroom visits, which she found bothersome. She previously had someone come to check her blood pressure at home, for which she was being charged, and after achieving a goal reading, she stopped monitoring her blood pressure regularly. She plans to purchase a home blood pressure cuff today. The patient adheres to a low-salt diet and remains active around the house. She also reports steady weight gain over the past few months and expressed a desire to improve her fitness in preparation for her granddaughter's upcoming wedding.We reviewed correct steps to check BP at home on home BP monitor . Other than Lasix  she is not on any meds that can lead to low Na level   Current HTN meds: Valsartan  320 mg daily and Coreg  25mg  twice daily furosemide  20 mg prn for edema, hydralazine  50 mg three times daily, amlodipine  5 mg daily   Previously tried:  BP goal: <130/80    Social History:  Alcohol : none  Diet: low salt diet   Exercise: stays active around the house    Home BP readings: 192/94,179/89,172/78,129/75   Wt Readings from Last 3 Encounters:  07/18/23 180 lb (81.6 kg)  07/18/23 182 lb 6.4 oz (82.7 kg)  07/05/23 182 lb 12.8 oz (82.9 kg)   BP Readings from Last 3 Encounters:  07/18/23 (!) 138/58  07/18/23 (!) 142/59  07/10/23 (!) 162/73   Pulse Readings from Last 3 Encounters:  07/18/23 64  07/18/23 65  07/10/23 63    Renal function: CrCl cannot be calculated (Patient's most recent lab result is older than the maximum 21 days allowed.).  Past Medical History:  Diagnosis Date   Arthritis    Bilateral edema of lower extremity    CKD (chronic kidney disease)    Constipation    Degenerative arthritis of hip    s/p THR 05/2011   Depression    Glaucoma    Headache(784.0)    HLD (hyperlipidemia)    Hypertension    OAB (overactive bladder)    Osteoporosis    Primary osteoarthritis of right knee    Slow transit constipation    Unsteady gait    Venous insufficiency     Current  Outpatient Medications on File Prior to Visit  Medication Sig Dispense Refill   amLODipine  (NORVASC ) 5 MG tablet Take 1 tablet (5 mg total) by mouth daily. 180 tablet 3   aspirin  EC 81 MG tablet Take 1 tablet (81 mg total) by mouth daily. Swallow whole.     brimonidine (ALPHAGAN) 0.2 % ophthalmic solution Place 1 drop into the left eye 3 (three) times daily.     carvedilol  (COREG ) 25 MG tablet Take 25 mg by mouth 2 (two) times daily.     dorzolamidel-timolol  (COSOPT) 22.3-6.8 MG/ML SOLN ophthalmic solution Place 1 drop into both eyes 2 (two) times daily.     furosemide  (LASIX ) 40 MG tablet Take 20 mg by mouth daily.     hydrALAZINE  (APRESOLINE ) 50 MG tablet Take 1 tablet (50 mg total) by mouth 3 (three) times daily. 270 tablet 3   latanoprost (XALATAN) 0.005 % ophthalmic solution Place 1 drop into the left eye  nightly.     memantine  (NAMENDA ) 10 MG tablet Take 1 tablet (10 mg at night) for 2 weeks, then increase to 1 tablet (10 mg) twice a day 180 tablet 3   rosuvastatin  (CRESTOR ) 20 MG tablet Take 1 tablet (20 mg total) by mouth daily. 90 tablet 3   timolol  (TIMOPTIC ) 0.5 % ophthalmic solution Place 1 drop into both eyes 2 (two) times daily.     valsartan  (DIOVAN ) 320 MG tablet Take 1 tablet (320 mg total) by mouth daily. 90 tablet 1   No current facility-administered medications on file prior to visit.    Allergies  Allergen Reactions   Clindamycin Hcl Itching   Silicone Rash    There were no vitals taken for this visit.   Assessment/Plan:  1. Hypertension -  No problem-specific Assessment & Plan notes found for this encounter.      Thank you  Robbi Blanch, Pharm.D [place] El[facility] BIRCH. [facility] [address], [place], [place] [address] Phone: [phone]; Fax: [phone]

## 2023-09-18 ENCOUNTER — Ambulatory Visit: Admitting: Pharmacist

## 2023-09-18 ENCOUNTER — Encounter: Payer: Self-pay | Admitting: Pharmacist

## 2023-09-18 NOTE — Telephone Encounter (Signed)
 Error

## 2023-09-24 ENCOUNTER — Ambulatory Visit (INDEPENDENT_AMBULATORY_CARE_PROVIDER_SITE_OTHER): Admitting: Podiatry

## 2023-09-24 ENCOUNTER — Encounter: Payer: Self-pay | Admitting: Podiatry

## 2023-09-24 DIAGNOSIS — M21612 Bunion of left foot: Secondary | ICD-10-CM

## 2023-09-24 DIAGNOSIS — L600 Ingrowing nail: Secondary | ICD-10-CM

## 2023-09-24 DIAGNOSIS — M2041 Other hammer toe(s) (acquired), right foot: Secondary | ICD-10-CM

## 2023-09-24 DIAGNOSIS — I890 Lymphedema, not elsewhere classified: Secondary | ICD-10-CM

## 2023-09-24 DIAGNOSIS — M2042 Other hammer toe(s) (acquired), left foot: Secondary | ICD-10-CM

## 2023-09-24 DIAGNOSIS — M21611 Bunion of right foot: Secondary | ICD-10-CM | POA: Diagnosis not present

## 2023-09-24 DIAGNOSIS — L84 Corns and callosities: Secondary | ICD-10-CM

## 2023-09-24 NOTE — Progress Notes (Unsigned)
 Chief Complaint  Patient presents with   Toe Pain    Main concern is toe pain bilaterally and being comfortable walking.  Check for Bilateral ingrown Great toe nails.   HPI: 88 y.o. female presents today with multiple concerns:  First she has chronic issues with swelling in her legs.  She does not wear compression stockings on a regular basis.  She states the swelling is getting progressively worse.  Denies any calf pain.  She feels like her great toenails are ingrown.  Denies drainage or injury  She has a callus on her left bunion that she would request to be shaved.  She has multiple hammertoes and a bunion.  She cannot tolerate any type of Silipos gel product due to allergies.  She is wondering if there are any other types of spacers or splints that she can try.  She is wondering if she needs surgery.  Past Medical History:  Diagnosis Date   Arthritis    Bilateral edema of lower extremity    CKD (chronic kidney disease)    Constipation    Degenerative arthritis of hip    s/p THR 05/2011   Depression    Glaucoma    Headache(784.0)    HLD (hyperlipidemia)    Hypertension    OAB (overactive bladder)    Osteoporosis    Primary osteoarthritis of right knee    Slow transit constipation    Unsteady gait    Venous insufficiency    Past Surgical History:  Procedure Laterality Date    cataracts removed  2013 L   ABDOMINAL HYSTERECTOMY  2003   BREAST BIOPSY  1960   BREAST CYSTS     X2 BENIGN   BREAST EXCISIONAL BIOPSY Left    BREAST SURGERY     ENDOVENOUS ABLATION SAPHENOUS VEIN W/ LASER Left 06/04/2019   endovenous laser ablation left greater saphenous vein by Medford Blade MD    GLAUCOMA VALVE INSERTION Bilateral 2013, 2014   [facility] ([facility])   REDUCTION MAMMAPLASTY Bilateral 2000   TOTAL HIP ARTHROPLASTY  06/01/2011   Procedure: TOTAL HIP ARTHROPLASTY ANTERIOR APPROACH;  Surgeon: Lonni CINDERELLA Poli, MD;  Location: [facility];  Service: Orthopedics;  Laterality: Left;  Left Total  Hip Replacement, Direct Anterior Approach   TOTAL KNEE ARTHROPLASTY Right 03/14/2015   Procedure: TOTAL KNEE ARTHROPLASTY;  Surgeon: Dempsey Moan, MD;  Location: [facility];  Service: Orthopedics;  Laterality: Right;   Allergies  Allergen Reactions   Clindamycin Hcl Itching   Silicone Rash   Review of Systems  Cardiovascular:  Positive for orthopnea and leg swelling.  Musculoskeletal:  Positive for joint pain.       Hammertoes, bunion      Physical Exam: Trace palpable pedal pulses.  +1-2 pitting edema bilateral legs and ankles. Right ankle measures 10 inches in circumference, left ankle 10.25 inches.  The right calf circumference measures 15.5 inches and left calf measures 17 inches. Multiple telangiectasias noted in the lower legs and ankles.  No cellulitis appreciated.  Interspaces are clear of maceration and debris.  The hallux nails are incurvated along the medial and lateral borders but no signs of true onychocryptosis or paronychia are noted.  There is some discomfort along the distal borders of the hallux nails.  There is a hyperkeratotic lesion on the plantar medial aspect of the left first MPJ.  There is a bony prominence on the medial aspect of the first metatarsal head bilateral.  Lateral angulation of the great toe noted.  Semiflexible  contractures of the lesser toes at the PIPJ's.  Assessment/Plan of Care: 1. Lymphedema   2. Ingrown toenail   3. Hammertoes of both feet   4. Bilateral bunions   5. Callus of foot     Discussed findings with the patient today.  She has not tried at home lymphedema compression pumps.  She is open to trying them.  Will reach out to our rep from [employer] and get her set up for at home compression pumps.  The callus near the left bunion was shaved with a sterile #[id number] blade  The hallux nails along the distal corners along the medial and lateral nail margins were cut back with sterile nail nippers to alleviate any discomfort from incurvated  nail edges.  No further treatment needed to these toes.  Patient was fitted for a toe alignment splint in order to help with the hallux valgus in a better position and also straighten the toes.  She can continue with this every night.  Offered her from spacers but she refused.  She stated she tried these in the past and they did not help.  Pulled up the patient's vascular studies from 09/10/2022 which showed a right ABI of 0.51 and a left ABI of 0.69.  The right ABI was 0.16 and left TBI was 0.26.  Due to her poor arterial flow to the feet, I do not recommend surgical intervention at this time.  It would be too risky and she most likely would not heal unless she underwent arterial revascularization, which she wants to avoid .  Follow-up as needed  Awanda CHARM Imperial, DPM, FACFAS [facility]     [address] [place], [place] [address]                Office [phone]  Fax [phone]

## 2023-10-07 ENCOUNTER — Ambulatory Visit: Admitting: Physician Assistant

## 2023-10-07 ENCOUNTER — Ambulatory Visit

## 2023-10-07 ENCOUNTER — Telehealth: Payer: Self-pay | Admitting: Podiatry

## 2023-10-07 NOTE — Telephone Encounter (Signed)
 Called and left voicemail to get patient rescheduled for casting appt.

## 2023-10-22 ENCOUNTER — Telehealth: Payer: Self-pay

## 2023-10-22 NOTE — Telephone Encounter (Signed)
 Patient called and LVM about her appointment for orthotics on 10/13.. I called patient back to answer her questions and LVM

## 2023-10-28 ENCOUNTER — Other Ambulatory Visit

## 2023-10-28 ENCOUNTER — Ambulatory Visit: Attending: Cardiology | Admitting: Pharmacist

## 2023-10-28 VITALS — BP 150/72 | HR 56

## 2023-10-28 DIAGNOSIS — I10 Essential (primary) hypertension: Secondary | ICD-10-CM

## 2023-10-28 NOTE — Patient Instructions (Addendum)
 Changes made by your pharmacist Erie Radu E. Teegan Guinther, PharmD at today's visit:    Instructions/Changes  (what do you need to do) Your Notes  (what you did and when you did it)  Take furosemide  40 mg daily for 3 days then take 20 mg every other day thereafter    2. Contact PCP or wound care about leg ASAP   3.    Bring all of your meds, your BP cuff and your record of home blood pressures to your next appointment.    HOW TO TAKE YOUR BLOOD PRESSURE AT HOME  Rest 5 minutes before taking your blood pressure.  Don't smoke or drink caffeinated beverages for at least 30 minutes before. Take your blood pressure before (not after) you eat. Sit comfortably with your back supported and both feet on the floor (don't cross your legs). Elevate your arm to heart level on a table or a desk. Use the proper sized cuff. It should fit smoothly and snugly around your bare upper arm. There should be enough room to slip a fingertip under the cuff. The bottom edge of the cuff should be 1 inch above the crease of the elbow. Ideally, take 3 measurements at one sitting and record the average.  Important lifestyle changes to control high blood pressure  Intervention  Effect on the BP  Lose extra pounds and watch your waistline Weight loss is one of the most effective lifestyle changes for controlling blood pressure. If you're overweight or obese, losing even a small amount of weight can help reduce blood pressure. Blood pressure might go down by about 1 millimeter of mercury (mm Hg) with each kilogram (about 2.2 pounds) of weight lost.  Exercise regularly As a general goal, aim for at least 30 minutes of moderate physical activity every day. Regular physical activity can lower high blood pressure by about 5 to 8 mm Hg.  Eat a healthy diet Eating a diet rich in whole grains, fruits, vegetables, and low-fat dairy products and low in saturated fat and cholesterol. A healthy diet can lower high blood pressure by up to  11 mm Hg.  Reduce salt (sodium) in your diet Even a small reduction of sodium in the diet can improve heart health and reduce high blood pressure by about 5 to 6 mm Hg.  Limit alcohol  One drink equals 12 ounces of beer, 5 ounces of wine, or 1.5 ounces of 80-proof liquor.  Limiting alcohol  to less than one drink a day for women or two drinks a day for men can help lower blood pressure by about 4 mm Hg.   If you have any questions or concerns please use My Chart to send questions or call the office at [phone]

## 2023-10-28 NOTE — Assessment & Plan Note (Signed)
 Assessment: BP is uncontrolled in office BP 155/69 mmHg above the goal (<130/80). Tolerates valsartan  320 mg daily, Coreg  25mg  twice daily, hydralazine  25 mg three times daily, amlodipine  5 mg daily well without any side effects Reports leg swelling. Reports SOB which she states is normal for her. Denies palpitation, chest pain, headaches Emphasized the importance of contacting PCP or wound care for an appointment for leg as soon as possible Reiterated the importance of checking BP and bring log to appointment  Plan:  Start taking furosemide  40 mg x 3 days then take 20 mg every other day thereafter Continue taking valsartan  320 mg daily, Coreg  25mg  twice daily, hydralazine  25 mg three times daily, amlodipine  5 mg daily Patient to keep record of BP readings with heart rate (couple times per week) and report to us  at the next visit Patient to see PharmD in 4 weeks for follow up

## 2023-10-28 NOTE — Progress Notes (Unsigned)
 Patient ID: [name]                 DOB: 06/04/1935                      MRN: [id number]      HPI: [name] is a 88 y.o. female referred by Dr. Aline Door, PA-C to HTN clinic. PMH is significant for LBBB, chronic lower extremity edema secondary to venous insufficiency, mild mitral regurgitation, PAD with moderately reduced ABIs bilaterally in 08/2022, hypertension, hyperlipidemia, CKD stage III, and MGUS   The patient presented today to the hypertension clinic reporting persistent lower leg swelling. She states that she has not been taking Lasix . Upon revealing her legs, her left leg appears to be swollen and blistered. She mentioned that she contacted wound care to schedule an appointment. I encouraged her to follow up promptly and ensure she is seen as soon as possible.   At her last PharmD appointment, amlodipine  5 mg daily was added. She reports that since starting this medication, her blood pressure has been at goal (<130/80). She does not have a blood pressure log today but reported her BP readings to healthcare personnel but we were unable to find readings in her chart. She is being charged to have her blood pressure checked at the living facility but is willing to have her blood pressure checked a couple times per week moving forward. We reviewed correct steps to check BP at home on home BP monitor.  She denies any headache, fatigue, dizziness/lightheadedness. She reports SOB which is normal for her and she is seeing pulmonology for this soon.  Current HTN meds: Valsartan  320 mg daily, Coreg  25mg  twice daily, hydralazine  25 mg three times daily, amlodipine  5 mg daily  Previously tried: none  BP goal: <130/80   [facility] charges Social History:  Alcohol : none  Diet: low salt diet   Exercise: stays active around the house    Home BP readings: Patient has no home BP readings    Wt Readings from Last 3 Encounters:  07/18/23 180 lb (81.6 kg)  07/18/23 182 lb  6.4 oz (82.7 kg)  07/05/23 182 lb 12.8 oz (82.9 kg)   BP Readings from Last 3 Encounters:  10/28/23 (!) 150/72  07/18/23 (!) 138/58  07/18/23 (!) 142/59   Pulse Readings from Last 3 Encounters:  10/28/23 (!) 56  07/18/23 64  07/18/23 65    Renal function: CrCl cannot be calculated (Patient's most recent lab result is older than the maximum 21 days allowed.).  Past Medical History:  Diagnosis Date   Arthritis    Bilateral edema of lower extremity    CKD (chronic kidney disease)    Constipation    Degenerative arthritis of hip    s/p THR 05/2011   Depression    Glaucoma    Headache(784.0)    HLD (hyperlipidemia)    Hypertension    OAB (overactive bladder)    Osteoporosis    Primary osteoarthritis of right knee    Slow transit constipation    Unsteady gait    Venous insufficiency     Current Outpatient Medications on File Prior to Visit  Medication Sig Dispense Refill   amLODipine  (NORVASC ) 5 MG tablet Take 1 tablet (5 mg total) by mouth daily. 180 tablet 3   aspirin  EC 81 MG tablet Take 1 tablet (81 mg total) by mouth daily. Swallow whole.     brimonidine (ALPHAGAN) 0.2 %  ophthalmic solution Place 1 drop into the left eye 3 (three) times daily.     carvedilol  (COREG ) 25 MG tablet Take 25 mg by mouth 2 (two) times daily.     dorzolamidel-timolol  (COSOPT) 22.3-6.8 MG/ML SOLN ophthalmic solution Place 1 drop into both eyes 2 (two) times daily.     furosemide  (LASIX ) 40 MG tablet Take 20 mg by mouth daily.     hydrALAZINE  (APRESOLINE ) 50 MG tablet Take 1 tablet (50 mg total) by mouth 3 (three) times daily. 270 tablet 3   latanoprost (XALATAN) 0.005 % ophthalmic solution Place 1 drop into the left eye nightly.     memantine  (NAMENDA ) 10 MG tablet Take 1 tablet (10 mg at night) for 2 weeks, then increase to 1 tablet (10 mg) twice a day 180 tablet 3   rosuvastatin  (CRESTOR ) 20 MG tablet Take 1 tablet (20 mg total) by mouth daily. 90 tablet 3   timolol  (TIMOPTIC ) 0.5 %  ophthalmic solution Place 1 drop into both eyes 2 (two) times daily.     valsartan  (DIOVAN ) 320 MG tablet Take 1 tablet (320 mg total) by mouth daily. 90 tablet 1   No current facility-administered medications on file prior to visit.    Allergies  Allergen Reactions   Clindamycin Hcl Itching   Silicone Rash    Blood pressure (!) 150/72, pulse (!) 56.   Assessment/Plan:  1. Hypertension -  Hypertension Assessment: BP is uncontrolled in office BP 155/69 mmHg above the goal (<130/80). Tolerates valsartan  320 mg daily, Coreg  25mg  twice daily, hydralazine  25 mg three times daily, amlodipine  5 mg daily well without any side effects Reports leg swelling. Reports SOB which she states is normal for her. Denies palpitation, chest pain, headaches Emphasized the importance of contacting PCP or wound care for an appointment for leg as soon as possible Reiterated the importance of checking BP and bring log to appointment  Plan:  Start taking furosemide  40 mg x 3 days then take 20 mg every other day thereafter Continue taking valsartan  320 mg daily, Coreg  25mg  twice daily, hydralazine  25 mg three times daily, amlodipine  5 mg daily Patient to keep record of BP readings with heart rate (couple times per week) and report to us  at the next visit Patient to see PharmD in 4 weeks for follow up    Thank you  Robbi Blanch, Pharm.D [place] [facility]. [facility] [address], [place], [place] [address] Phone: [phone]; Fax: [phone]

## 2023-10-30 ENCOUNTER — Other Ambulatory Visit: Payer: Self-pay | Admitting: Student

## 2023-10-30 DIAGNOSIS — I10 Essential (primary) hypertension: Secondary | ICD-10-CM

## 2023-10-31 ENCOUNTER — Telehealth: Payer: Self-pay

## 2023-10-31 NOTE — Telephone Encounter (Signed)
 Pt. Has an appt. on10/13 pt wants  to know if she should keep the appt because she feels that now is not the right time due to the fact Pt has neuropathy and from 8-5 her feet are constantly changing please advise best contact number for pt [phone]

## 2023-11-01 NOTE — Telephone Encounter (Signed)
 Advised patient states will reschedule and see provider due to her concerns.

## 2023-11-11 ENCOUNTER — Other Ambulatory Visit

## 2023-11-13 ENCOUNTER — Ambulatory Visit

## 2023-11-13 ENCOUNTER — Encounter: Payer: Self-pay | Admitting: Pharmacist

## 2023-11-13 VITALS — BP 135/66 | HR 58 | Temp 97.2°F | Ht 60.0 in | Wt 183.0 lb

## 2023-11-13 DIAGNOSIS — G4733 Obstructive sleep apnea (adult) (pediatric): Secondary | ICD-10-CM

## 2023-11-13 DIAGNOSIS — R0600 Dyspnea, unspecified: Secondary | ICD-10-CM

## 2023-11-13 DIAGNOSIS — S81802A Unspecified open wound, left lower leg, initial encounter: Secondary | ICD-10-CM

## 2023-11-13 NOTE — Assessment & Plan Note (Signed)
 Suspect OSA. Will get sleep study and if positive she would prefer getting a mandibular advancement device then a CPAP. I discussed with the patient the pathophysiology of obstructive sleep apnea, its association with weight, and its negative effects on hypertension, diabetes, mental health, A-fib, stroke if left untreated.  I briefly discussed the treatment options for obstructive sleep apnea  Orders:   Nocturnal polysomnography; Future

## 2023-11-13 NOTE — Progress Notes (Signed)
 New Patient Pulmonology Office Visit   Subjective:  Patient ID: [name], female    DOB: 16-Mar-1935  MRN: [id number]  Referred by: [name], MD  CC:  Chief Complaint  Patient presents with   Consult    Fatigue during the day.  Frequent awakenings during the night.    HPI [name] is a 88 y.o. female with Hypertension, LBBB, osteoarthritis, diastolic heart failure, depression, obesity class II.  Presents for evaluation of OSA.  OSA history: Sleep study done 2 years ago diagnosed with mild OSA.   Symptoms:  chronic left leg wound: sees PCP.  Chronic LBP.  ET is limited with 1/2 block. For last 2-3 years. No emphysema or asthma hx. No cough, phlegm, N/V, CP.  Chronic leg swelling for 3 years.  Gained 20-30 lbs in last few years.    Mouth breather: y Preferred sleeping position: back and propped up.   Sleep related Symptoms:  Snoring- no Witnessed apnea- y Gasping/choking- y morning HA/dry mouth- n/y tired on awakening, excessive daytime sleepiness- feels tired all the day.  No RLS.  No parasomnia.   Sleep routine:  -Bed: 10.30p -Nocturnal awakenings: 3-4 times to use restroom. -Wake: 7 AM. -Napping: 1 hr every few days.  -sleep hygiene: watching TV.   Social Hist/Habits:  -Caffeine: no -Alcohol : no -Nicotine:ex smoker. Quit at age of 47.  -Occupation: house wife. No exposures   PRIOR TESTS and IMAGING: PSG/HSAT: none.   Echo June 2025: EF 60-65%.  Moderate asymmetric LVH. Mild MR.    PFT 2021: Mild reduction in DLCO [uncorrected].  No obstruction or restriction or lung disease.     11/13/2023   10:00 AM  Results of the Epworth flowsheet  Sitting and reading 2  Watching TV 2  Sitting, inactive in a public place (e.g. a theatre or a meeting) 3  As a passenger in a car for an hour without a break 3  Lying down to rest in the afternoon when circumstances permit 1  Sitting and talking to someone 2  Sitting quietly after a lunch  without alcohol  1  In a car, while stopped for a few minutes in traffic 1  Total score 15    Allergies: Clindamycin hcl and Silicone  Current Outpatient Medications:    amLODipine  (NORVASC ) 5 MG tablet, Take 1 tablet (5 mg total) by mouth daily., Disp: 180 tablet, Rfl: 3   aspirin  EC 81 MG tablet, Take 1 tablet (81 mg total) by mouth daily. Swallow whole., Disp: , Rfl:    brimonidine (ALPHAGAN) 0.2 % ophthalmic solution, Place 1 drop into the left eye 3 (three) times daily., Disp: , Rfl:    carvedilol  (COREG ) 25 MG tablet, Take 25 mg by mouth 2 (two) times daily., Disp: , Rfl:    dorzolamidel-timolol  (COSOPT) 22.3-6.8 MG/ML SOLN ophthalmic solution, Place 1 drop into both eyes 2 (two) times daily., Disp: , Rfl:    furosemide  (LASIX ) 40 MG tablet, Take 20 mg by mouth daily., Disp: , Rfl:    hydrALAZINE  (APRESOLINE ) 50 MG tablet, Take 1 tablet (50 mg total) by mouth 3 (three) times daily., Disp: 270 tablet, Rfl: 3   latanoprost (XALATAN) 0.005 % ophthalmic solution, Place 1 drop into the left eye nightly., Disp: , Rfl:    rosuvastatin  (CRESTOR ) 20 MG tablet, Take 1 tablet (20 mg total) by mouth daily., Disp: 90 tablet, Rfl: 3   timolol  (TIMOPTIC ) 0.5 % ophthalmic solution, Place 1 drop into both eyes 2 (  two) times daily., Disp: , Rfl:    valsartan  (DIOVAN ) 320 MG tablet, TAKE ONE TABLET DAILY, Disp: 90 tablet, Rfl: 3 Past Medical History:  Diagnosis Date   Arthritis    Bilateral edema of lower extremity    CKD (chronic kidney disease)    Constipation    Degenerative arthritis of hip    s/p THR 05/2011   Depression    Glaucoma    Headache(784.0)    HLD (hyperlipidemia)    Hypertension    OAB (overactive bladder)    Osteoporosis    Primary osteoarthritis of right knee    Slow transit constipation    Unsteady gait    Venous insufficiency    Past Surgical History:  Procedure Laterality Date    cataracts removed  2013 L   ABDOMINAL HYSTERECTOMY  2003   BREAST BIOPSY  1960   BREAST  CYSTS     X2 BENIGN   BREAST EXCISIONAL BIOPSY Left    BREAST SURGERY     ENDOVENOUS ABLATION SAPHENOUS VEIN W/ LASER Left 06/04/2019   endovenous laser ablation left greater saphenous vein by Medford Blade MD    GLAUCOMA VALVE INSERTION Bilateral 2013, 2014   bond ([facility])   REDUCTION MAMMAPLASTY Bilateral 2000   TOTAL HIP ARTHROPLASTY  06/01/2011   Procedure: TOTAL HIP ARTHROPLASTY ANTERIOR APPROACH;  Surgeon: Lonni CINDERELLA Poli, MD;  Location: [facility];  Service: Orthopedics;  Laterality: Left;  Left Total Hip Replacement, Direct Anterior Approach   TOTAL KNEE ARTHROPLASTY Right 03/14/2015   Procedure: TOTAL KNEE ARTHROPLASTY;  Surgeon: Dempsey Moan, MD;  Location: [facility];  Service: Orthopedics;  Laterality: Right;   Family History  Problem Relation Age of Onset   Cancer Mother    Alcohol  abuse Father    Emphysema Father    Stroke Brother    Melanoma Brother    Colon cancer Neg Hx    Esophageal cancer Neg Hx    Stomach cancer Neg Hx    Social History   Socioeconomic History   Marital status: Divorced    Spouse name: Not on file   Number of children: 1   Years of education: Not on file   Highest education level: Not on file  Occupational History   Occupation: house wife  Tobacco Use   Smoking status: Former    Current packs/day: 0.00    Average packs/day: 1 pack/day for 35.0 years (35.0 ttl pk-yrs)    Types: Cigarettes    Start date: 05/27/1945    Quit date: 05/27/1980    Years since quitting: 43.4   Smokeless tobacco: Never  Vaping Use   Vaping status: Never Used  Substance and Sexual Activity   Alcohol  use: No    Alcohol /week: 0.0 standard drinks of alcohol    Drug use: No   Sexual activity: Not Currently    Partners: Male    Comment: Divorced  Other Topics Concern   Not on file  Social History Narrative   [place].  One child, Zachary.  Education: college.   Right handed    Social Drivers of Corporate investment banker Strain: Not on  file  Food Insecurity: Not on file  Transportation Needs: Not on file  Physical Activity: Not on file  Stress: Not on file  Social Connections: Not on file  Intimate Partner Violence: Not on file       Objective:  BP 135/66   Pulse (!) 58   Temp (!) 97.2 F (36.2  C) (Oral)   Ht 5' (1.524 m)   Wt 183 lb (83 kg)   SpO2 95% Comment: room air  BMI 35.74 kg/m  BMI Readings from Last 3 Encounters:  11/13/23 35.74 kg/m  07/18/23 34.01 kg/m  07/18/23 34.46 kg/m    Physical Exam: CONSTITUTIONAL: NAD, well-appearing NASAL/OROPHARYNX:  Normal mucosa. No septal deviation. No hypertrophy of inferior turbinates. Modified Mallampati score 2.  CV: RRR s1s2 nl, no murmurs  RESP: Clear to auscultation, normal respiratory effort   NEURO: CN II/XII grossly intact PSYCH: Alert & oriented x 3, Euthymic, appropriate affect  Diagnostic Review:  Last CBC Lab Results  Component Value Date   WBC 4.8 07/05/2023   HGB 12.2 07/05/2023   HCT 33.0 (L) 07/05/2023   MCV 90.9 07/05/2023   MCH 33.6 07/05/2023   RDW 12.2 07/05/2023   PLT 174 07/05/2023   Last metabolic panel Lab Results  Component Value Date   GLUCOSE 93 07/15/2023   NA 131 (L) 07/15/2023   K 4.1 07/15/2023   CL 99 07/15/2023   CO2 25 07/15/2023   BUN 14 07/15/2023   CREATININE 0.96 07/15/2023   GFRNONAA 58 (L) 07/15/2023   CALCIUM  9.2 07/15/2023   PROT 6.8 07/05/2023   ALBUMIN 4.1 07/05/2023   LABGLOB 2.7 07/05/2023   BILITOT 0.7 07/05/2023   ALKPHOS 107 07/05/2023   AST 21 07/05/2023   ALT 16 07/05/2023   ANIONGAP 7 07/15/2023         Assessment & Plan:   Assessment & Plan OSA (obstructive sleep apnea) Suspect OSA. Will get sleep study and if positive she would prefer getting a mandibular advancement device then a CPAP. I discussed with the patient the pathophysiology of obstructive sleep apnea, its association with weight, and its negative effects on hypertension, diabetes, mental health, A-fib, stroke  if left untreated.  I briefly discussed the treatment options for obstructive sleep apnea  Orders:   Nocturnal polysomnography; Future  Leg wound, left, initial encounter Follow-up with PCP.  Had an appointment with wound which she canceled.    Dyspnea, unspecified type Chronic dyspnea likely related to diastolic dysfunction of heart. PFT to rule out underlying lung disorder. May get chest x-ray or CT chest in future if needed. Orders:   Pulmonary Function Test; Future   No follow-ups on file.   Time spent: 30 min.   Saul Fabiano, MD

## 2023-11-13 NOTE — Assessment & Plan Note (Addendum)
 Chronic dyspnea likely related to diastolic dysfunction of heart. PFT to rule out underlying lung disorder. May get chest x-ray or CT chest in future if needed. Orders:   Pulmonary Function Test; Future

## 2023-11-13 NOTE — Patient Instructions (Addendum)
 Notification of test results are managed in the following manner: If there are any recommendations or changes to the plan of care discussed in office today, we will contact you and let you know what they are. If you do not hear from us , then your results are normal/expected and you can view them through your MyChart account, or a letter will be sent to you. Thank you again for trusting us  with your care [facility].

## 2023-11-18 ENCOUNTER — Encounter (HOSPITAL_BASED_OUTPATIENT_CLINIC_OR_DEPARTMENT_OTHER): Admitting: Internal Medicine

## 2023-11-19 ENCOUNTER — Telehealth: Payer: Self-pay | Admitting: Internal Medicine

## 2023-11-19 NOTE — Telephone Encounter (Signed)
*  STAT* If patient is at the pharmacy, call can be transferred to refill team.   1. Which medications need to be refilled? (please list name of each medication and dose if known)   valsartan  (DIOVAN ) 320 MG tablet   2. Would you like to learn more about the convenience, safety, & potential cost savings by using the [facility]?   3. Are you open to using the [facility] (Type [facility]. ).  4. Which pharmacy/location (including street and city if local pharmacy) is medication to be sent to?  [facility] - [place], [place] - [address]   5. Do they need a 30 day or 90 day supply?   20 tablets  Patient stated she misplaced her medication and is 20 tablets short.  Patient wants a small supply sent to her pharmacy.

## 2023-11-19 NOTE — Telephone Encounter (Signed)
 Called pt to in inform her that she has refills left at her pharmacy and that she needed to request more medication and that she probably will have to pay out of pocket for more medication. Pt verbalized understanding.

## 2023-11-20 ENCOUNTER — Encounter

## 2023-11-25 ENCOUNTER — Ambulatory Visit: Payer: Self-pay

## 2023-11-25 ENCOUNTER — Institutional Professional Consult (permissible substitution): Admitting: Psychology

## 2023-11-26 NOTE — Progress Notes (Unsigned)
 Patient ID: SWETHA RAYLE                 DOB: 09/23/1935                      MRN: [id number]      HPI: [name] is a 88 y.o. female referred by Dr. Aline Door, PA-C to HTN clinic. PMH is significant for LBBB, chronic lower extremity edema secondary to venous insufficiency, mild mitral regurgitation, PAD with moderately reduced ABIs bilaterally in 08/2022, hypertension, hyperlipidemia, CKD stage III, and MGUS   The patient presented today to the hypertension clinic reporting persistent lower leg swelling. She states that she has not been taking Lasix . Upon revealing her legs, her left leg appears to be swollen and blistered. She mentioned that she contacted wound care to schedule an appointment. I encouraged her to follow up promptly and ensure she is seen as soon as possible.   At her last PharmD appointment, amlodipine  5 mg daily was added. Since initiating this medication, the patient reports that her blood pressure has consistently remained at goal (<130/80 mmHg). Today, the patient presented in good spirits. She reports home blood pressure readings averaging around 122/61 mmHg, with a heart rate of 56 bpm. She recently purchased a new blood pressure cuff after discovering that the retirement facility nurse was charging for BP checks. The patient is now under the care of a wound care nurse and has started antibiotics for a lower leg wound. She is receiving weekly wound care visits. She denies any symptoms of headache, fatigue, dizziness, or lightheadedness.  Current HTN meds: Valsartan  320 mg daily, Coreg  25mg  twice daily, hydralazine  25 mg three times daily, amlodipine  5 mg daily  Previously tried: none  BP goal: <130/80   Social History:  Alcohol : none  Diet: low salt diet   Exercise: stays active around the house    Home BP readings:  SBP DBP  HR   125 67 55  132 78 58  121 68 58  119 64 58  128 66 57  101 51 57  106 54 54  123 58 56  124  66 62  120 61 62  122  52 51  108 60 55  110 54 57  115 54 55  122 62 63  132 60 54  145 65  56  134 65 55  119 59 53  124 64 52  121.5 61.4 56.4    Wt Readings from Last 3 Encounters:  11/13/23 183 lb (83 kg)  07/18/23 180 lb (81.6 kg)  07/18/23 182 lb 6.4 oz (82.7 kg)   BP Readings from Last 3 Encounters:  11/27/23 131/69  11/13/23 135/66  10/28/23 (!) 150/72   Pulse Readings from Last 3 Encounters:  11/27/23 60  11/13/23 (!) 58  10/28/23 (!) 56    Renal function: CrCl cannot be calculated (Patient's most recent lab result is older than the maximum 21 days allowed.).  Past Medical History:  Diagnosis Date   Arthritis    Bilateral edema of lower extremity    CKD (chronic kidney disease)    Constipation    Degenerative arthritis of hip    s/p THR 05/2011   Depression    Glaucoma    Headache(784.0)    HLD (hyperlipidemia)    Hypertension    OAB (overactive bladder)    Osteoporosis    Primary osteoarthritis of right knee    Slow transit constipation  Unsteady gait    Venous insufficiency     Current Outpatient Medications on File Prior to Visit  Medication Sig Dispense Refill   amLODipine  (NORVASC ) 5 MG tablet Take 1 tablet (5 mg total) by mouth daily. 180 tablet 3   aspirin  EC 81 MG tablet Take 1 tablet (81 mg total) by mouth daily. Swallow whole.     brimonidine (ALPHAGAN) 0.2 % ophthalmic solution Place 1 drop into the left eye 3 (three) times daily.     carvedilol  (COREG ) 25 MG tablet Take 25 mg by mouth 2 (two) times daily.     dorzolamidel-timolol  (COSOPT) 22.3-6.8 MG/ML SOLN ophthalmic solution Place 1 drop into both eyes 2 (two) times daily.     furosemide  (LASIX ) 40 MG tablet Take 20 mg by mouth daily.     hydrALAZINE  (APRESOLINE ) 50 MG tablet Take 1 tablet (50 mg total) by mouth 3 (three) times daily. 270 tablet 3   latanoprost (XALATAN) 0.005 % ophthalmic solution Place 1 drop into the left eye nightly.     rosuvastatin  (CRESTOR ) 20 MG tablet Take 1 tablet (20 mg  total) by mouth daily. 90 tablet 3   timolol  (TIMOPTIC ) 0.5 % ophthalmic solution Place 1 drop into both eyes 2 (two) times daily.     valsartan  (DIOVAN ) 320 MG tablet TAKE ONE TABLET DAILY 90 tablet 3   No current facility-administered medications on file prior to visit.    Allergies  Allergen Reactions   Clindamycin Hcl Itching   Silicone Rash    Blood pressure 131/69, pulse 60.   Assessment/Plan:  1. Hypertension -  Hypertension Assessment and plan:  The patient's in-office blood pressure today was at goal, and she reports that her home readings are also consistently within target range. She is currently taking and tolerating her antihypertensive medications well, without any reported side effects. Her current hypertension regimen includes:  Valsartan  320 mg daily Carvedilol  (Coreg ) 25 mg twice daily Hydralazine  25 mg three times daily Amlodipine  5 mg daily  No changes are being made to her current medications at this time. The patient has been advised to continue her current regimen and to notify us  if her blood pressure persistently rises above goal.    Thank you  Robbi Blanch, Pharm.D [place] [facility]. [facility] [address], [place], [place] [address] Phone: [phone]; Fax: [phone]

## 2023-11-27 ENCOUNTER — Ambulatory Visit: Admitting: Pharmacist

## 2023-11-27 ENCOUNTER — Encounter: Payer: Self-pay | Admitting: Pharmacist

## 2023-11-27 ENCOUNTER — Ambulatory Visit: Attending: Cardiology | Admitting: Pharmacist

## 2023-11-27 VITALS — BP 131/69 | HR 60

## 2023-11-27 DIAGNOSIS — I10 Essential (primary) hypertension: Secondary | ICD-10-CM | POA: Diagnosis not present

## 2023-11-27 NOTE — Assessment & Plan Note (Signed)
 Assessment and plan:  The patient's in-office blood pressure today was at goal, and she reports that her home readings are also consistently within target range. She is currently taking and tolerating her antihypertensive medications well, without any reported side effects. Her current hypertension regimen includes:  Valsartan  320 mg daily Carvedilol  (Coreg ) 25 mg twice daily Hydralazine  25 mg three times daily Amlodipine  5 mg daily  No changes are being made to her current medications at this time. The patient has been advised to continue her current regimen and to notify us  if her blood pressure persistently rises above goal.

## 2023-12-02 ENCOUNTER — Encounter: Admitting: Psychology

## 2023-12-04 ENCOUNTER — Encounter (HOSPITAL_BASED_OUTPATIENT_CLINIC_OR_DEPARTMENT_OTHER): Admitting: Internal Medicine

## 2023-12-13 ENCOUNTER — Telehealth: Payer: Self-pay | Admitting: Internal Medicine

## 2023-12-13 MED ORDER — AMLODIPINE BESYLATE 5 MG PO TABS: 2.5000 mg | ORAL_TABLET | Freq: Every day | ORAL | Status: AC

## 2023-12-13 NOTE — Telephone Encounter (Signed)
 Pt c/o medication issue:  1. Name of Medication: amLODipine  (NORVASC ) 5 MG tablet (Expired)   2. How are you currently taking this medication (dosage and times per day)?    3. Are you having a reaction (difficulty breathing--STAT)? Mp   4. What is your medication issue? Medication is making patient swell all over, she want to stop taking medication. Please advise

## 2023-12-13 NOTE — Telephone Encounter (Signed)
 Called left detailed message on patient voicemail/answer machine. To decrease the Amlodipine  2.5 mg daily contact the office the middle of the week to give an update.

## 2023-12-30 ENCOUNTER — Other Ambulatory Visit: Payer: Self-pay | Admitting: Student

## 2024-01-02 ENCOUNTER — Telehealth: Payer: Self-pay | Admitting: Physician Assistant

## 2024-01-03 ENCOUNTER — Inpatient Hospital Stay

## 2024-01-10 ENCOUNTER — Ambulatory Visit: Admitting: Physician Assistant

## 2024-01-15 ENCOUNTER — Ambulatory Visit (HOSPITAL_BASED_OUTPATIENT_CLINIC_OR_DEPARTMENT_OTHER)

## 2024-01-16 ENCOUNTER — Ambulatory Visit: Admitting: Orthopaedic Surgery

## 2024-01-20 ENCOUNTER — Ambulatory Visit: Admitting: Physician Assistant

## 2024-01-27 ENCOUNTER — Ambulatory Visit: Admitting: Physician Assistant

## 2024-01-27 ENCOUNTER — Ambulatory Visit: Payer: Self-pay

## 2024-01-27 NOTE — Telephone Encounter (Signed)
 [email or web address] Pulmonary Triage - Initial Assessment Questions Chief Complaint (e.g., cough, sob, wheezing, fever, chills, sweat or additional symptoms) *Go to specific symptom protocol after initial questions. SOB with activity  How long have symptoms been present? A month ago  Have you tested for COVID or Flu? Note: If not, ask patient if a home test can be taken. If so, instruct patient to call back for positive results. No  MEDICINES:   Have you used any OTC meds to help with symptoms? No If yes, ask What medications?   Have you used your inhalers/maintenance medication? No If yes, What medications?   If inhaler, ask How many puffs and how often? Note: Review instructions on medication in the chart.   OXYGEN: Do you wear supplemental oxygen? No If yes, How many liters are you supposed to use?   Do you monitor your oxygen levels? No If yes, What is your reading (oxygen level) today?   What is your usual oxygen saturation reading?  (Note: Pulmonary O2 sats should be 90% or greater)     Copied from CRM #[id number]. Topic: Clinical - Red Word Triage >> Jan 27, 2024  4:00 PM Chantha C wrote: Red Word that prompted transfer to Nurse Triage: Patient [phone] states wants Dr. Lavena consult, symptoms are getting worse, cannot breath, hard to catch breath when walking in the room, and wheezing. Patient denies dizziness, pain, nor fever. Please advise. Reason for Disposition  [1] MILD difficulty breathing (e.g., minimal/no SOB at rest, SOB with walking, pulse < 100) AND [2] NEW-onset or WORSE than normal  Answer Assessment - Initial Assessment Questions 1. RESPIRATORY STATUS: Describe your breathing? (e.g., wheezing, shortness of breath, unable to speak, severe coughing)      Speaking well at rest 2. ONSET: When did this breathing problem begin?      Month ago 3. PATTERN Does the difficult breathing come and go, or has it been constant since it started?       Comes and goes 4. SEVERITY: How bad is your breathing? (e.g., mild, moderate, severe)      Severe difficulty with activity 5. RECURRENT SYMPTOM: Have you had difficulty breathing before? If Yes, ask: When was the last time? and What happened that time?       6. CARDIAC HISTORY: Do you have any history of heart disease? (e.g., heart attack, angina, bypass surgery, angioplasty)      HTN 7. LUNG HISTORY: Do you have any history of lung disease?  (e.g., pulmonary embolus, asthma, emphysema)     NO 8. CAUSE: What do you think is causing the breathing problem?      UNKNOWN 9. OTHER SYMPTOMS: Do you have any other symptoms? (e.g., chest pain, cough, dizziness, fever, runny nose)     Wheezing at times 10. O2 SATURATION MONITOR:  Do you use an oxygen saturation monitor (pulse oximeter) at home? If Yes, ask: What is your reading (oxygen level) today? What is your usual oxygen saturation reading? (e.g., 95%)       Doesn't monitor  Protocols used: Breathing Difficulty-A-AH

## 2024-01-27 NOTE — Telephone Encounter (Signed)
 I called and spoke with patient, she had not had her PFT done, I scheduled her to have her PFT on 02/04/24 at 1 pm, advised to arrive by 12:45 pm for check in.  I also scheduled her to see Dr. Theodoro on 02/04/24 at 3 pm.  She verbalized understanding.  Nothing further needed.

## 2024-02-04 ENCOUNTER — Encounter: Payer: Self-pay | Admitting: Gastroenterology

## 2024-02-04 ENCOUNTER — Encounter

## 2024-02-04 ENCOUNTER — Ambulatory Visit

## 2024-02-04 ENCOUNTER — Ambulatory Visit (INDEPENDENT_AMBULATORY_CARE_PROVIDER_SITE_OTHER): Admitting: *Deleted

## 2024-02-04 VITALS — BP 118/64 | HR 61 | Temp 97.6°F | Ht 60.0 in | Wt 184.0 lb

## 2024-02-04 DIAGNOSIS — R0600 Dyspnea, unspecified: Secondary | ICD-10-CM

## 2024-02-04 DIAGNOSIS — R600 Localized edema: Secondary | ICD-10-CM | POA: Diagnosis not present

## 2024-02-04 DIAGNOSIS — Z87891 Personal history of nicotine dependence: Secondary | ICD-10-CM | POA: Diagnosis not present

## 2024-02-04 DIAGNOSIS — J42 Unspecified chronic bronchitis: Secondary | ICD-10-CM | POA: Diagnosis not present

## 2024-02-04 DIAGNOSIS — R0609 Other forms of dyspnea: Secondary | ICD-10-CM

## 2024-02-04 DIAGNOSIS — G4733 Obstructive sleep apnea (adult) (pediatric): Secondary | ICD-10-CM

## 2024-02-04 DIAGNOSIS — I272 Pulmonary hypertension, unspecified: Secondary | ICD-10-CM

## 2024-02-04 LAB — PULMONARY FUNCTION TEST
DL/VA % pred: 74 %
DL/VA: 3.09 ml/min/mmHg/L
DLCO cor % pred: 54 %
DLCO cor: 8.77 ml/min/mmHg
DLCO unc % pred: 54 %
DLCO unc: 8.77 ml/min/mmHg
FEF 25-75 Post: 0.85 L/s
FEF 25-75 Pre: 0.54 L/s
FEF2575-%Change-Post: 58 %
FEF2575-%Pred-Post: 97 %
FEF2575-%Pred-Pre: 61 %
FEV1-%Change-Post: 13 %
FEV1-%Pred-Post: 81 %
FEV1-%Pred-Pre: 71 %
FEV1-Post: 1.1 L
FEV1-Pre: 0.97 L
FEV1FVC-%Change-Post: 5 %
FEV1FVC-%Pred-Pre: 92 %
FEV6-%Change-Post: 7 %
FEV6-%Pred-Post: 88 %
FEV6-%Pred-Pre: 82 %
FEV6-Post: 1.54 L
FEV6-Pre: 1.43 L
FEV6FVC-%Change-Post: 0 %
FEV6FVC-%Pred-Post: 107 %
FEV6FVC-%Pred-Pre: 107 %
FVC-%Change-Post: 7 %
FVC-%Pred-Post: 83 %
FVC-%Pred-Pre: 77 %
FVC-Post: 1.55 L
FVC-Pre: 1.45 L
Post FEV1/FVC ratio: 71 %
Post FEV6/FVC ratio: 100 %
Pre FEV1/FVC ratio: 67 %
Pre FEV6/FVC Ratio: 100 %
RV % pred: 104 %
RV: 2.41 L
TLC % pred: 88 %
TLC: 3.95 L

## 2024-02-04 MED ORDER — UMECLIDINIUM-VILANTEROL 62.5-25 MCG/ACT IN AEPB: 1.0000 | INHALATION_SPRAY | Freq: Every day | RESPIRATORY_TRACT | 5 refills | Status: AC

## 2024-02-04 MED ORDER — ALBUTEROL SULFATE HFA 108 (90 BASE) MCG/ACT IN AERS: 2.0000 | INHALATION_SPRAY | Freq: Four times a day (QID) | RESPIRATORY_TRACT | 2 refills | Status: AC | PRN

## 2024-02-04 MED ORDER — FUROSEMIDE 40 MG PO TABS: 40.0000 mg | ORAL_TABLET | Freq: Every day | ORAL | 0 refills | Status: AC

## 2024-02-04 NOTE — Assessment & Plan Note (Addendum)
 [name]

## 2024-02-04 NOTE — Addendum Note (Signed)
 Addended by: Kateryn Marasigan D on: 02/04/2024 04:40 PM   Modules accepted: Orders

## 2024-02-04 NOTE — Progress Notes (Signed)
 Full PFT performed today.

## 2024-02-04 NOTE — Assessment & Plan Note (Addendum)
 With +ve BD response will give albuterol  inhaler prn for trial.

## 2024-02-04 NOTE — Progress Notes (Addendum)
 "  New Patient Pulmonology Office Visit   Subjective:  Patient ID: [name], female    DOB: 11/28/1935  MRN: [id number]  Referred by: No ref. provider found  CC:  Chief Complaint  Patient presents with   Wheezing    Has had wheezing x 6 months.  Review PFT from today    Wheezing    [name] is a 89 y.o. female with Hypertension, LBBB, osteoarthritis, diastolic heart failure, depression, obesity class II.  Presents for evaluation of OSA.  11/13/23 > mild breather with symptoms of witnessed apnea and gasping arousal along with dry mouth.  Sleep study ordered which has not been done yet.  Scheduled for March 2026.  PFT ordered for evaluation.  OSA history: Sleep study done 2 years ago diagnosed with mild OSA.   ET is limited with 1/2 block. For last 2-3 years. No emphysema or asthma hx.  Ex-smoker quit at age of 60.  House-no exposures.  Discussed the use of AI scribe software for clinical note transcription with the patient, who gave verbal consent to proceed.  History of Present Illness   [name] is an 89 year old female who presents for follow-up of her pulmonary function test (PFT).  She has been experiencing significant shortness of breath for at least the past six months, describing difficulty walking long hallways and needing to stop halfway due to breathlessness. She can walk only about twenty feet on a flat surface before becoming short of breath. She is an ex-smoker.  She has persistent leg swelling, primarily in one leg, which has been treated with Cellcept and wrapped due to leakage. She is currently taking furosemide  20 mg but is concerned about its impact on her kidneys. Her legs are painful, and she has been using pumps on her legs, which have caused significant discomfort.  She mentions a history of kidney problems, which she was informed about by a gastroenterologist, although she is unsure of the specifics. She has been seeing Dr. Federico at [facility] for lab work related to her blood count, particularly her white blood cell count.  She reports poor sleep quality and suspects she may have sleep apnea, noting that her head falls over during sleep, affecting her breathing. She is not currently interested in pursuing a sleep study until her fluid retention is addressed.       Sleep routine:  -Bed: 10.30p -Nocturnal awakenings: 3-4 times to use restroom. -Wake: 7 AM. -Napping: 1 hr every few days.  -sleep hygiene: watching TV.    PRIOR TESTS and IMAGING: PSG/HSAT: none.   Echo June 2025: EF 60-65%.  Moderate asymmetric LVH. Mild MR. Moderate elevation in pulmonary artery systolic pressure.  PFT 2021: Mild reduction in DLCO [uncorrected].  No obstruction or restriction or lung disease. PFT Jan 2026: borderline obstruction but no restriction but significant positive bronchodilator response.[id number] low which improved and normalized with bronchodilator.  Moderate reduction in DLCO.     11/13/2023   10:00 AM  Results of the Epworth flowsheet  Sitting and reading 2  Watching TV 2  Sitting, inactive in a public place (e.g. a theatre or a meeting) 3  As a passenger in a car for an hour without a break 3  Lying down to rest in the afternoon when circumstances permit 1  Sitting and talking to someone 2  Sitting quietly after a lunch without alcohol  1  In a car, while stopped for a few minutes in  traffic 1  Total score 15    Allergies: Clindamycin hcl and Silicone  Current Outpatient Medications:    albuterol  (VENTOLIN  HFA) 108 (90 Base) MCG/ACT inhaler, Inhale 2 puffs into the lungs every 6 (six) hours as needed for wheezing or shortness of breath., Disp: 8 g, Rfl: 2   amLODipine  (NORVASC ) 5 MG tablet, Take 0.5 tablets (2.5 mg total) by mouth daily., Disp: , Rfl:    aspirin  EC 81 MG tablet, Take 1 tablet (81 mg total) by mouth daily. Swallow whole., Disp: , Rfl:    brimonidine (ALPHAGAN) 0.2 % ophthalmic solution,  Place 1 drop into the left eye 3 (three) times daily., Disp: , Rfl:    carvedilol  (COREG ) 25 MG tablet, Take 25 mg by mouth 2 (two) times daily., Disp: , Rfl:    dorzolamidel-timolol  (COSOPT) 22.3-6.8 MG/ML SOLN ophthalmic solution, Place 1 drop into both eyes 2 (two) times daily., Disp: , Rfl:    hydrALAZINE  (APRESOLINE ) 50 MG tablet, Take 1 tablet (50 mg total) by mouth 3 (three) times daily., Disp: 270 tablet, Rfl: 3   latanoprost (XALATAN) 0.005 % ophthalmic solution, Place 1 drop into the left eye nightly., Disp: , Rfl:    rosuvastatin  (CRESTOR ) 20 MG tablet, Take 1 tablet (20 mg total) by mouth daily., Disp: 90 tablet, Rfl: 1   umeclidinium-vilanterol (ANORO ELLIPTA ) 62.5-25 MCG/ACT AEPB, Inhale 1 puff into the lungs daily., Disp: 1 each, Rfl: 5   valsartan  (DIOVAN ) 320 MG tablet, TAKE ONE TABLET DAILY, Disp: 90 tablet, Rfl: 3   furosemide  (LASIX ) 40 MG tablet, Take 1 tablet (40 mg total) by mouth daily., Disp: 30 tablet, Rfl: 0 Past Medical History:  Diagnosis Date   Arthritis    Bilateral edema of lower extremity    CKD (chronic kidney disease)    Constipation    Degenerative arthritis of hip    s/p THR 05/2011   Depression    Glaucoma    Headache(784.0)    HLD (hyperlipidemia)    Hypertension    OAB (overactive bladder)    Osteoporosis    Primary osteoarthritis of right knee    Slow transit constipation    Unsteady gait    Venous insufficiency    Past Surgical History:  Procedure Laterality Date    cataracts removed  2013 L   ABDOMINAL HYSTERECTOMY  2003   BREAST BIOPSY  1960   BREAST CYSTS     X2 BENIGN   BREAST EXCISIONAL BIOPSY Left    BREAST SURGERY     ENDOVENOUS ABLATION SAPHENOUS VEIN W/ LASER Left 06/04/2019   endovenous laser ablation left greater saphenous vein by Medford Blade MD    GLAUCOMA VALVE INSERTION Bilateral 2013, 2014   bond ([facility])   REDUCTION MAMMAPLASTY Bilateral 2000   TOTAL HIP ARTHROPLASTY  06/01/2011   Procedure: TOTAL HIP ARTHROPLASTY  ANTERIOR APPROACH;  Surgeon: Lonni CINDERELLA Poli, MD;  Location: [facility];  Service: Orthopedics;  Laterality: Left;  Left Total Hip Replacement, Direct Anterior Approach   TOTAL KNEE ARTHROPLASTY Right 03/14/2015   Procedure: TOTAL KNEE ARTHROPLASTY;  Surgeon: Dempsey Moan, MD;  Location: [facility];  Service: Orthopedics;  Laterality: Right;   Family History  Problem Relation Age of Onset   Cancer Mother    Alcohol  abuse Father    Emphysema Father    Stroke Brother    Melanoma Brother    Colon cancer Neg Hx    Esophageal cancer Neg Hx    Stomach cancer Neg Hx  Social History   Socioeconomic History   Marital status: Divorced    Spouse name: Not on file   Number of children: 1   Years of education: Not on file   Highest education level: Not on file  Occupational History   Occupation: house wife  Tobacco Use   Smoking status: Former    Current packs/day: 0.00    Average packs/day: 1 pack/day for 35.0 years (35.0 ttl pk-yrs)    Types: Cigarettes    Start date: 05/27/1945    Quit date: 05/27/1980    Years since quitting: 43.7   Smokeless tobacco: Never  Vaping Use   Vaping status: Never Used  Substance and Sexual Activity   Alcohol  use: No    Alcohol /week: 0.0 standard drinks of alcohol    Drug use: No   Sexual activity: Not Currently    Partners: Male    Comment: Divorced  Other Topics Concern   Not on file  Social History Narrative   [place]  One child, Zachary.  Education: college.   Right handed    Social Drivers of Health   Tobacco Use: Medium Risk (02/04/2024)   Patient History    Smoking Tobacco Use: Former    Smokeless Tobacco Use: Never    Passive Exposure: Not on Actuary Strain: Not on file  Food Insecurity: Not on file  Transportation Needs: Not on file  Physical Activity: Not on file  Stress: Not on file  Social Connections: Not on file  Intimate Partner Violence: Not on file  Depression (PHQ2-9): Not on file   Alcohol  Screen: Not on file  Housing: Not on file  Utilities: Not on file  Health Literacy: Not on file       Objective:  BP 118/64   Pulse 61   Temp 97.6 F (36.4 C) (Temporal)   Ht 5' (1.524 m)   Wt 184 lb (83.5 kg)   SpO2 94% Comment: room air  BMI 35.94 kg/m  BMI Readings from Last 3 Encounters:  02/04/24 35.94 kg/m  11/13/23 35.74 kg/m  07/18/23 34.01 kg/m    Physical Exam: Physical Exam   ENT: Normal mucosa. No hypertrophy of inferior turbinates. Tonsils are normal sized. Modified Mallampati score is normal. PULMONARY: Lungs clear to auscultation bilaterally, no adventitious breath sounds. CARDIOVASCULAR: Regular rate and rhythm, S1 S2 normal, no murmurs. ABDOMEN: Abdomen soft, nontender. Bowel sounds are normal. EXTREMITIES: Bilateral leg edema noted. Leg wounds on left.       Diagnostic Review:  Last CBC Lab Results  Component Value Date   WBC 4.8 07/05/2023   HGB 12.2 07/05/2023   HCT 33.0 (L) 07/05/2023   MCV 90.9 07/05/2023   MCH 33.6 07/05/2023   RDW 12.2 07/05/2023   PLT 174 07/05/2023   Last metabolic panel Lab Results  Component Value Date   GLUCOSE 93 07/15/2023   NA 131 (L) 07/15/2023   K 4.1 07/15/2023   CL 99 07/15/2023   CO2 25 07/15/2023   BUN 14 07/15/2023   CREATININE 0.96 07/15/2023   GFRNONAA 58 (L) 07/15/2023   CALCIUM  9.2 07/15/2023   PROT 6.8 07/05/2023   ALBUMIN 4.1 07/05/2023   LABGLOB 2.7 07/05/2023   BILITOT 0.7 07/05/2023   ALKPHOS 107 07/05/2023   AST 21 07/05/2023   ALT 16 07/05/2023   ANIONGAP 7 07/15/2023         Assessment & Plan:   Assessment & Plan Pulmonary HTN (HCC)  Orders:  AMB referral to CHF clinic   Basic metabolic panel with GFR; Future  Dyspnea, unspecified type With +ve BD response will give albuterol  inhaler prn for trial.     OSA (obstructive sleep apnea)     DOE (dyspnea on exertion)     Chronic bronchitis, unspecified chronic bronchitis type (HCC) With borderline PFT  and her symptoms of wheezing with prior smoking history will add anoro ellipta .    Assessment and Plan    Volume overload with lower extremity edema and dyspnea Mod PHTN per ECHO Moderate reduction in DLCO with positive bronchodilator response, no obstruction or restriction. Significant lower extremity edema likely contributing to dyspnea. Current furosemide  dose insufficient. Cardiologist follow-up needed for heart failure management. - Increased furosemide  to 40 mg daily. - Referred to heart failure specialist. - Ordered lab work at primary care office in two weeks. - Continue leg wrapping and wound care.  Obstructive sleep apnea Suspected obstructive sleep apnea with poor sleep quality. Sleep study scheduled for march but she wants to do HST. She wants to get diuresed well before doing sleep study.  Treating sleep apnea expected to improve overall health. - Delayed sleep study until fluid overload is managed. - Plan for home sleep study in future.       Return in about 3 months (around 05/04/2024).   I personally spent a total of 30 minutes in the care of the patient today including preparing to see the patient, getting/reviewing separately obtained history, performing a medically appropriate exam/evaluation, counseling and educating, and placing orders.   Sammi Fredericks, MD  "

## 2024-02-04 NOTE — Patient Instructions (Addendum)
" °  VISIT SUMMARY: Today, we reviewed your pulmonary function test results and discussed your ongoing symptoms, including shortness of breath, leg swelling, and poor sleep quality. We have made some adjustments to your medications and planned further evaluations to better manage your conditions.  YOUR PLAN: -VOLUME OVERLOAD WITH LOWER EXTREMITY EDEMA AND DYSPNEA: Volume overload means your body is retaining too much fluid, which is causing swelling in your legs and making it hard for you to breathe. We have increased your furosemide  dose to 40 mg daily to help reduce the fluid. You should continue wrapping your legs and taking care of any wounds. We have also referred you to a heart failure specialist and ordered lab work to be done at your primary care office in two weeks.  -OBSTRUCTIVE SLEEP APNEA: Obstructive sleep apnea is a condition where your breathing stops and starts during sleep, leading to poor sleep quality. We suspect you have this condition, but we will delay the sleep study until your fluid overload is managed. Once that is under control, we will plan for a home sleep study to confirm the diagnosis and start treatment.  INSTRUCTIONS: Please follow up with the heart failure specialist as referred. Continue taking furosemide  40 mg daily and keep wrapping your legs and caring for any wounds. Complete the lab work at your primary care office in two weeks. We will plan for a home sleep study once your fluid overload is better managed.  Take albuterol  inahler as needed for SOB.                     Contains text generated by Abridge.                                 Contains text generated by Abridge.   "

## 2024-02-04 NOTE — Patient Instructions (Signed)
 Full PFT performed today.Full PFT performed today.

## 2024-02-07 ENCOUNTER — Telehealth: Payer: Self-pay | Admitting: Gastroenterology

## 2024-02-07 NOTE — Telephone Encounter (Signed)
 Good afternoon Dr. Stacia,    I received a call from this patient stating that she is scheduled to be seen with you for February the 2 nd. Patient has requesting to be seen with a female provider due to her having blood in her stool. Would you please advise on transferring her care over to a female provider.    Thank you.

## 2024-02-12 ENCOUNTER — Telehealth: Payer: Self-pay

## 2024-02-12 ENCOUNTER — Encounter: Payer: Self-pay | Admitting: Orthopaedic Surgery

## 2024-02-12 ENCOUNTER — Other Ambulatory Visit (INDEPENDENT_AMBULATORY_CARE_PROVIDER_SITE_OTHER)

## 2024-02-12 ENCOUNTER — Ambulatory Visit (INDEPENDENT_AMBULATORY_CARE_PROVIDER_SITE_OTHER): Admitting: Orthopaedic Surgery

## 2024-02-12 DIAGNOSIS — M25552 Pain in left hip: Secondary | ICD-10-CM

## 2024-02-12 DIAGNOSIS — Z96642 Presence of left artificial hip joint: Secondary | ICD-10-CM

## 2024-02-12 DIAGNOSIS — Z96649 Presence of unspecified artificial hip joint: Secondary | ICD-10-CM

## 2024-02-12 DIAGNOSIS — T84038A Mechanical loosening of other internal prosthetic joint, initial encounter: Secondary | ICD-10-CM

## 2024-02-12 NOTE — Progress Notes (Signed)
 The patient is an 89 year old female well-known to us .  In May 2013 we replaced her left hip secondary to end-stage arthritis.  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  She has been reluctant to proceed with any type of revision surgery.  Now at her age of 60 she has pulmonary issues and she does get significant peripheral edema.  She does ambulate with a rolling walker.  She reports hip pain and mainly low back pain.  She says her low back hurts worse than her hip.    On exam there is some shortening of her left leg comparing the left and I can rotated her left hip and it is well located.  X-rays today were compared to all the previous x-rays of her left hip.  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  So far the hip remains well located and likely has scarred and significantly.  I did let her know that the only option from a surgical standpoint would be revision of the acetabulum component and I showed her a hip model and explained what this type of surgery involves.  She would need to get clearance from her primary care physician and her cardiologist before proceeding with any type of surgery but she says right now she is not interested in any surgery so we will have her really go slow and use her walker and if anything worsen she knows to reach out and let us  know.

## 2024-02-12 NOTE — Telephone Encounter (Signed)
 Copied from CRM #[id number]. Topic: Clinical - Lab/Test Results >> Feb 10, 2024 10:20 AM Devaughn RAMAN wrote: Reason for CRM: Pt calling in regards to lab results basic metabolic results pt stated it says GFR and she wanted to inquire what that meant. Please f/u with pt regarding this.     I called and spoke to pt. I informed pt that Dr Theodoro has not received the lab work just yet however, when he does receive them, we would contact her. Pt stated she was not sure what the GFR meant in the lab work  Basic metabolic panel with GFR   I informed pt that this meant a filtration rate for her kidneys to see how well they filter waste. Pt verbalized understanding and stated she was in no hurry to receive results, she just wanted to better understand. NFN

## 2024-02-17 NOTE — Telephone Encounter (Signed)
 I do not have any available appointments for next 2-3 months, please check if Dr. Federico has any availability and if she is willing to accept the patient

## 2024-02-19 ENCOUNTER — Telehealth: Payer: Self-pay | Admitting: *Deleted

## 2024-02-19 DIAGNOSIS — G4733 Obstructive sleep apnea (adult) (pediatric): Secondary | ICD-10-CM

## 2024-02-19 NOTE — Telephone Encounter (Signed)
 Patient does not want in lab sleep study. Please advise if she can do home sleep study.  Copied from CRM [id number]. Topic: Appointments - Scheduling Inquiry for Clinic >> Feb 17, 2024 11:30 AM Rozanna MATSU wrote: Reason for CRM: pt stated she does not want to have the sleep procedure on 03/04 stated she advised Dr Theodoro that she wanted to have this done at home.

## 2024-02-20 NOTE — Telephone Encounter (Signed)
 Pt mailbox full Called to inform per Dr. Theodoro she could cancel sleep study. HST ordered please sign.

## 2024-02-21 ENCOUNTER — Other Ambulatory Visit: Payer: Self-pay | Admitting: Adult Health

## 2024-02-21 DIAGNOSIS — I83028 Varicose veins of left lower extremity with ulcer other part of lower leg: Secondary | ICD-10-CM

## 2024-02-21 DIAGNOSIS — I87332 Chronic venous hypertension (idiopathic) with ulcer and inflammation of left lower extremity: Secondary | ICD-10-CM

## 2024-02-21 DIAGNOSIS — I739 Peripheral vascular disease, unspecified: Secondary | ICD-10-CM

## 2024-02-21 DIAGNOSIS — R0989 Other specified symptoms and signs involving the circulatory and respiratory systems: Secondary | ICD-10-CM

## 2024-02-28 NOTE — Telephone Encounter (Signed)
 ATC x2

## 2024-03-02 ENCOUNTER — Ambulatory Visit (HOSPITAL_COMMUNITY): Admitting: Cardiology

## 2024-03-02 ENCOUNTER — Ambulatory Visit: Admitting: Gastroenterology

## 2024-03-04 NOTE — Telephone Encounter (Signed)
 Patient would like to know if she can take HST instead of in-lab.

## 2024-03-05 NOTE — Addendum Note (Signed)
 Addended by: Lian Pounds D on: 03/05/2024 08:51 AM   Modules accepted: Orders

## 2024-03-06 ENCOUNTER — Telehealth: Payer: Self-pay | Admitting: Student

## 2024-03-06 NOTE — Telephone Encounter (Signed)
 Patient wants to notify Callie Goodrich that she is switching her cardiology care.

## 2024-03-11 ENCOUNTER — Encounter (HOSPITAL_BASED_OUTPATIENT_CLINIC_OR_DEPARTMENT_OTHER): Admitting: General Surgery

## 2024-03-18 ENCOUNTER — Ambulatory Visit: Admitting: Internal Medicine

## 2024-03-18 ENCOUNTER — Ambulatory Visit (HOSPITAL_COMMUNITY): Payer: Self-pay | Admitting: Cardiology

## 2024-03-23 ENCOUNTER — Ambulatory Visit: Admitting: Gastroenterology

## 2024-03-25 ENCOUNTER — Ambulatory Visit: Admitting: Gastroenterology

## 2024-04-01 ENCOUNTER — Ambulatory Visit (HOSPITAL_BASED_OUTPATIENT_CLINIC_OR_DEPARTMENT_OTHER)
